# Patient Record
Sex: Male | Born: 1950 | Race: White | Hispanic: No | Marital: Married | Smoking: Never smoker
Health system: Southern US, Community
[De-identification: ages and names within clinical notes are randomized; demographics above are authoritative.]

## PROBLEM LIST (undated history)

## (undated) DIAGNOSIS — C61 Malignant neoplasm of prostate: Secondary | ICD-10-CM

## (undated) DIAGNOSIS — K219 Gastro-esophageal reflux disease without esophagitis: Secondary | ICD-10-CM

## (undated) DIAGNOSIS — M1990 Unspecified osteoarthritis, unspecified site: Secondary | ICD-10-CM

## (undated) DIAGNOSIS — T8859XA Other complications of anesthesia, initial encounter: Secondary | ICD-10-CM

## (undated) DIAGNOSIS — R32 Unspecified urinary incontinence: Secondary | ICD-10-CM

## (undated) DIAGNOSIS — Z973 Presence of spectacles and contact lenses: Secondary | ICD-10-CM

## (undated) DIAGNOSIS — G4700 Insomnia, unspecified: Secondary | ICD-10-CM

## (undated) DIAGNOSIS — Z87442 Personal history of urinary calculi: Secondary | ICD-10-CM

## (undated) DIAGNOSIS — J189 Pneumonia, unspecified organism: Secondary | ICD-10-CM

## (undated) DIAGNOSIS — N189 Chronic kidney disease, unspecified: Secondary | ICD-10-CM

## (undated) DIAGNOSIS — L570 Actinic keratosis: Secondary | ICD-10-CM

## (undated) HISTORY — DX: Actinic keratosis: L57.0

## (undated) HISTORY — PX: OTHER SURGICAL HISTORY: SHX169

## (undated) HISTORY — DX: Malignant neoplasm of prostate: C61

## (undated) HISTORY — PX: CATARACT EXTRACTION W/ INTRAOCULAR LENS IMPLANT: SHX1309

## (undated) HISTORY — PX: TRIGGER FINGER RELEASE: SHX641

## (undated) HISTORY — PX: COLONOSCOPY: SHX174

## (undated) HISTORY — PX: CARPAL TUNNEL RELEASE: SHX101

## (undated) HISTORY — PX: SHOULDER ARTHROSCOPY W/ ROTATOR CUFF REPAIR: SHX2400

---

## 2005-08-08 HISTORY — PX: CERVICAL FUSION: SHX112

## 2005-09-06 ENCOUNTER — Ambulatory Visit: Payer: Self-pay | Admitting: Orthopedic Surgery

## 2005-09-27 ENCOUNTER — Encounter: Admission: RE | Admit: 2005-09-27 | Discharge: 2005-09-27 | Payer: Self-pay | Admitting: Neurological Surgery

## 2005-12-15 ENCOUNTER — Ambulatory Visit (HOSPITAL_COMMUNITY): Admission: RE | Admit: 2005-12-15 | Discharge: 2005-12-15 | Payer: Self-pay | Admitting: Neurological Surgery

## 2006-01-10 ENCOUNTER — Encounter: Admission: RE | Admit: 2006-01-10 | Discharge: 2006-01-10 | Payer: Self-pay | Admitting: Neurological Surgery

## 2006-02-22 ENCOUNTER — Other Ambulatory Visit: Payer: Self-pay

## 2006-02-28 ENCOUNTER — Ambulatory Visit: Payer: Self-pay | Admitting: Orthopedic Surgery

## 2006-03-13 ENCOUNTER — Encounter: Admission: RE | Admit: 2006-03-13 | Discharge: 2006-03-13 | Payer: Self-pay | Admitting: Neurological Surgery

## 2007-05-24 ENCOUNTER — Ambulatory Visit: Payer: Self-pay | Admitting: Orthopedic Surgery

## 2007-06-11 ENCOUNTER — Encounter: Admission: RE | Admit: 2007-06-11 | Discharge: 2007-06-11 | Payer: Self-pay | Admitting: Neurological Surgery

## 2008-02-27 ENCOUNTER — Ambulatory Visit: Payer: Self-pay | Admitting: Orthopedic Surgery

## 2008-05-06 ENCOUNTER — Ambulatory Visit: Payer: Self-pay | Admitting: Orthopedic Surgery

## 2008-05-13 ENCOUNTER — Ambulatory Visit: Payer: Self-pay | Admitting: Orthopedic Surgery

## 2008-08-08 HISTORY — PX: RETROPUBIC PROSTATECTOMY: SUR1055

## 2009-04-01 ENCOUNTER — Ambulatory Visit: Payer: Self-pay | Admitting: Orthopedic Surgery

## 2009-06-09 ENCOUNTER — Ambulatory Visit: Payer: Self-pay | Admitting: Orthopedic Surgery

## 2009-06-15 ENCOUNTER — Ambulatory Visit: Payer: Self-pay | Admitting: Orthopedic Surgery

## 2009-07-25 ENCOUNTER — Ambulatory Visit: Payer: Self-pay | Admitting: Orthopedic Surgery

## 2009-12-31 ENCOUNTER — Ambulatory Visit: Payer: Self-pay | Admitting: Urology

## 2010-01-04 ENCOUNTER — Inpatient Hospital Stay: Payer: Self-pay | Admitting: Urology

## 2010-12-24 NOTE — Op Note (Signed)
NAME:  Elijah Taylor, Elijah Taylor NO.:  [id number]   MEDICAL RECORD NO.:  [id number]          PATIENT TYPE:  AMB   LOCATION:  SDS                          FACILITY:  [facility]   PHYSICIAN:  Tia Alert, MD     DATE OF BIRTH:  June 01, 1951   DATE OF PROCEDURE:  12/15/2005  DATE OF DISCHARGE:                                 OPERATIVE REPORT   PREOPERATIVE DIAGNOSIS:  Cervical spondylosis C3-4 with cervical disk  herniation and neck and left shoulder pain.   POSTOPERATIVE DIAGNOSIS:  Cervical spondylosis C3-4 with cervical disk  herniation and neck and left shoulder pain.   PROCEDURE:  1.  Decompressive anterior cervical diskectomy C3-4.  2.  Anterior cervical arthrodesis C3-4 utilizing a 6 mm Peek interbody cage      packed with local autograft and DBX putty.  3.  Anterior cervical plating C3-4 utilizing a 25 mm anterior plate.   SURGEON:  Tia Alert, MD   ASSISTANT:  Reinaldo Meeker, M.D.   ANESTHESIA:  General tracheal.   COMPLICATIONS:  None apparent.   INDICATIONS FOR PROCEDURE:  [name] is a very pleasant 60 year old  white male who is referred with neck pain.  He had some radiation into the  left shoulder and left pectoralis region into the interscapular region but  no radicular pain down the arm.  He had MRI which showed spondylosis at C3-4  with small disk bulges at C5-6 and C6-7.  CT myelography confirmed the disk  herniation at C3-4 to the left with very small disk bulge at C5-6 and facet  arthrosis at C7-T1 on the left side.  It was very difficult to know the  cause of his neck pain but we felt it was most likely coming from his C3-4  region.  He had a lot of tenderness and pain around the C3-4 region on the  left but also had pain in the C7-T1 region on the left side so we could not  promise he was not also symptomatic and C7-T1 region.  We talked extensively  about treatment options.  He wished for surgical management.  We recommended  an  anterior cervical diskectomy, fusion and plating at C3-4.  He understood  the risks, benefits and expected outcome of the procedure and wished to  proceed.   DESCRIPTION OF THE PROCEDURE:  The patient was taken to operating room and  after induction of adequate general endotracheal anesthesia, he was placed  in a supine position on the operating room table.  His right anterior  cervical region was prepped with DuraPrep and then draped in the usual  sterile fashion.  Five mL of local anesthesia was injected and a small  transverse incision was made to the right of midline and carried down to the  platysma muscle which was elevated, opened and undermined with Metzenbaum  scissors.  I then dissected in a plane medial to the sternocleidomastoid  muscle, internal carotid artery and lateral to the trachea and esophagus to  expose C3-4.  Intraoperative fluoroscopy confirmed my level and the longus  colli muscles were taken down and  the Shadow-Line retractors were placed  under these.  The anterior annulus was incised with a 15 blade scalpel and  the initial diskectomy was done with pituitary rongeurs and curved curettes.  We then used the high-speed drill to drill the endplates down to the level  of the posterior longitudinal ligament.  The drill shavings were saved in a  mucous trap for later arthrodesis.  We then brought in the operating  microscope and opened the posterior longitudinal ligament with a nerve hook  and removed it in a circumferential fashion along the posterior osteophytes  at C3-4.  We were careful to undercut the body of C3 and C4 to decompress  the central canal.  We angle the microscope to look up under the body of  each at C3 and at C4 to make sure we had undercut the posterior osteophytes  at these levels.  Bilateral foraminotomies were performed and the C4 nerve  root was identified bilaterally, but especially on the left side because  that is where the patient's symptoms  were.  We followed the nerve root into  the foramen.  Once the decompression was complete, the dura was quite full  and capacious all the way across.  We felt we had a good decompression of  the central canal and the nerve roots.  We irrigated with saline solution  containing bacitracin.  We measured the interspace to be 6 mm and used a 6  mm Peek interbody cage packed with local autograft saved in the drilling and  DBX putty.  We tapped this into position and then checked this under  fluoroscopy.  We then used a 25 mm Venture plate placed two 13 mm variable  angle screws in the bodies of C3 and C4 and locked these into position with  locking mechanism on the plate.  We then achieved meticulous hemostasis with  bipolar cautery, irrigated the wound once again and then inspected to make  sure we had achieved meticulous hemostasis.  We then closed the platysma  with 3-0 Vicryl, closed the subcuticular tissue with 3-0 Vicryl, and closed  the skin with Benzoin and Steri-Strips.  The drapes were removed.  Sterile  dressing was applied.  The patient was awakened from general anesthesia and  transferred to the recovery room in stable condition.  At the end of the  procedure, all sponge, needle and sponge counts were correct.      Tia Alert, MD  Electronically Signed     DSJ/MEDQ  D:  12/15/2005  T:  12/16/2005  Job:  [id number]

## 2013-08-05 ENCOUNTER — Ambulatory Visit: Payer: Self-pay | Admitting: Urology

## 2013-08-06 LAB — PATHOLOGY REPORT

## 2014-05-15 DIAGNOSIS — M65359 Trigger finger, unspecified little finger: Secondary | ICD-10-CM | POA: Diagnosis not present

## 2014-05-15 DIAGNOSIS — Z0000 Encounter for general adult medical examination without abnormal findings: Secondary | ICD-10-CM | POA: Diagnosis not present

## 2014-08-12 DIAGNOSIS — N393 Stress incontinence (female) (male): Secondary | ICD-10-CM | POA: Diagnosis not present

## 2014-08-12 DIAGNOSIS — D400 Neoplasm of uncertain behavior of prostate: Secondary | ICD-10-CM | POA: Diagnosis not present

## 2014-08-13 DIAGNOSIS — Z125 Encounter for screening for malignant neoplasm of prostate: Secondary | ICD-10-CM | POA: Diagnosis not present

## 2014-09-01 ENCOUNTER — Other Ambulatory Visit: Payer: Self-pay | Admitting: Orthopedic Surgery

## 2014-09-01 ENCOUNTER — Encounter (HOSPITAL_BASED_OUTPATIENT_CLINIC_OR_DEPARTMENT_OTHER): Payer: Self-pay | Admitting: *Deleted

## 2014-09-01 DIAGNOSIS — M65322 Trigger finger, left index finger: Secondary | ICD-10-CM | POA: Diagnosis not present

## 2014-09-01 NOTE — Progress Notes (Signed)
No labs needed

## 2014-09-02 ENCOUNTER — Ambulatory Visit (HOSPITAL_BASED_OUTPATIENT_CLINIC_OR_DEPARTMENT_OTHER): Payer: Medicare Other | Admitting: Anesthesiology

## 2014-09-02 ENCOUNTER — Ambulatory Visit (HOSPITAL_BASED_OUTPATIENT_CLINIC_OR_DEPARTMENT_OTHER)
Admission: RE | Admit: 2014-09-02 | Discharge: 2014-09-02 | Disposition: A | Discharge status: Home / Self Care | Payer: Medicare Other | Source: Ambulatory Visit | Attending: Orthopedic Surgery | Admitting: Orthopedic Surgery

## 2014-09-02 ENCOUNTER — Encounter (HOSPITAL_BASED_OUTPATIENT_CLINIC_OR_DEPARTMENT_OTHER): Payer: Self-pay | Admitting: *Deleted

## 2014-09-02 ENCOUNTER — Encounter (HOSPITAL_BASED_OUTPATIENT_CLINIC_OR_DEPARTMENT_OTHER)
Admission: RE | Disposition: A | Discharge status: Home / Self Care | Payer: Self-pay | Source: Ambulatory Visit | Attending: Orthopedic Surgery

## 2014-09-02 DIAGNOSIS — Y939 Activity, unspecified: Secondary | ICD-10-CM | POA: Diagnosis not present

## 2014-09-02 DIAGNOSIS — M1990 Unspecified osteoarthritis, unspecified site: Secondary | ICD-10-CM | POA: Insufficient documentation

## 2014-09-02 DIAGNOSIS — M65842 Other synovitis and tenosynovitis, left hand: Secondary | ICD-10-CM | POA: Diagnosis present

## 2014-09-02 DIAGNOSIS — Z8546 Personal history of malignant neoplasm of prostate: Secondary | ICD-10-CM | POA: Insufficient documentation

## 2014-09-02 DIAGNOSIS — X58XXXA Exposure to other specified factors, initial encounter: Secondary | ICD-10-CM | POA: Diagnosis not present

## 2014-09-02 DIAGNOSIS — Y929 Unspecified place or not applicable: Secondary | ICD-10-CM | POA: Insufficient documentation

## 2014-09-02 DIAGNOSIS — M65322 Trigger finger, left index finger: Secondary | ICD-10-CM | POA: Diagnosis not present

## 2014-09-02 DIAGNOSIS — Z7982 Long term (current) use of aspirin: Secondary | ICD-10-CM | POA: Insufficient documentation

## 2014-09-02 DIAGNOSIS — Y999 Unspecified external cause status: Secondary | ICD-10-CM | POA: Insufficient documentation

## 2014-09-02 DIAGNOSIS — S63611A Unspecified sprain of left index finger, initial encounter: Secondary | ICD-10-CM | POA: Insufficient documentation

## 2014-09-02 DIAGNOSIS — Z79899 Other long term (current) drug therapy: Secondary | ICD-10-CM | POA: Insufficient documentation

## 2014-09-02 HISTORY — DX: Insomnia, unspecified: G47.00

## 2014-09-02 HISTORY — PX: TRIGGER FINGER RELEASE: SHX641

## 2014-09-02 HISTORY — DX: Unspecified osteoarthritis, unspecified site: M19.90

## 2014-09-02 HISTORY — DX: Presence of spectacles and contact lenses: Z97.3

## 2014-09-02 LAB — POCT HEMOGLOBIN-HEMACUE: Hemoglobin: 16.8 g/dL (ref 13.0–17.0)

## 2014-09-02 SURGERY — RELEASE, A1 PULLEY, FOR TRIGGER FINGER
Anesthesia: Monitor Anesthesia Care | Site: Finger | Laterality: Left

## 2014-09-02 MED ORDER — LIDOCAINE HCL (PF) 0.5 % IJ SOLN
INTRAMUSCULAR | Status: DC | PRN
  Administered 2014-09-02: 30 mL via INTRAVENOUS

## 2014-09-02 MED ORDER — CEFAZOLIN SODIUM-DEXTROSE 2-3 GM-% IV SOLR: 2.0000 g | INTRAVENOUS | Status: DC

## 2014-09-02 MED ORDER — LACTATED RINGERS IV SOLN
INTRAVENOUS | Status: DC
  Administered 2014-09-02: 09:00:00 via INTRAVENOUS

## 2014-09-02 MED ORDER — BUPIVACAINE HCL (PF) 0.25 % IJ SOLN
INTRAMUSCULAR | Status: DC | PRN
  Administered 2014-09-02: 3 mL

## 2014-09-02 MED ORDER — CHLORHEXIDINE GLUCONATE 4 % EX LIQD: 60.0000 mL | Freq: Once | CUTANEOUS | Status: DC

## 2014-09-02 MED ORDER — PROPOFOL INFUSION 10 MG/ML OPTIME
INTRAVENOUS | Status: DC | PRN
  Administered 2014-09-02: 100 ug/kg/min via INTRAVENOUS

## 2014-09-02 MED ORDER — CEFAZOLIN SODIUM-DEXTROSE 2-3 GM-% IV SOLR
2.0000 g | INTRAVENOUS | Status: AC
  Administered 2014-09-02: 2 g via INTRAVENOUS

## 2014-09-02 MED ORDER — HYDROMORPHONE HCL 1 MG/ML IJ SOLN: 0.2500 mg | INTRAMUSCULAR | Status: DC | PRN

## 2014-09-02 MED ORDER — MIDAZOLAM HCL 2 MG/2ML IJ SOLN
INTRAMUSCULAR | Status: AC
  Filled 2014-09-02: qty 2

## 2014-09-02 MED ORDER — FENTANYL CITRATE 0.05 MG/ML IJ SOLN
INTRAMUSCULAR | Status: AC
  Filled 2014-09-02: qty 2

## 2014-09-02 MED ORDER — OXYCODONE HCL 5 MG/5ML PO SOLN: 5.0000 mg | Freq: Once | ORAL | Status: DC | PRN

## 2014-09-02 MED ORDER — FENTANYL CITRATE 0.05 MG/ML IJ SOLN: 50.0000 ug | INTRAMUSCULAR | Status: DC | PRN

## 2014-09-02 MED ORDER — FENTANYL CITRATE 0.05 MG/ML IJ SOLN
INTRAMUSCULAR | Status: DC | PRN
  Administered 2014-09-02: 100 ug via INTRAVENOUS

## 2014-09-02 MED ORDER — OXYCODONE HCL 5 MG PO TABS: 5.0000 mg | ORAL_TABLET | Freq: Once | ORAL | Status: DC | PRN

## 2014-09-02 MED ORDER — MIDAZOLAM HCL 2 MG/2ML IJ SOLN: 1.0000 mg | INTRAMUSCULAR | Status: DC | PRN

## 2014-09-02 MED ORDER — PROPOFOL 10 MG/ML IV BOLUS
INTRAVENOUS | Status: DC | PRN
  Administered 2014-09-02: 50 mg via INTRAVENOUS

## 2014-09-02 MED ORDER — ONDANSETRON HCL 4 MG/2ML IJ SOLN
INTRAMUSCULAR | Status: DC | PRN
  Administered 2014-09-02: 4 mg via INTRAVENOUS

## 2014-09-02 MED ORDER — HYDROCODONE-ACETAMINOPHEN 5-325 MG PO TABS: 1.0000 | ORAL_TABLET | Freq: Four times a day (QID) | ORAL | Status: DC | PRN

## 2014-09-02 MED ORDER — PROMETHAZINE HCL 25 MG/ML IJ SOLN: 6.2500 mg | INTRAMUSCULAR | Status: DC | PRN

## 2014-09-02 MED ORDER — CEFAZOLIN SODIUM 1-5 GM-% IV SOLN
INTRAVENOUS | Status: AC
  Filled 2014-09-02: qty 100

## 2014-09-02 MED ORDER — MIDAZOLAM HCL 5 MG/5ML IJ SOLN
INTRAMUSCULAR | Status: DC | PRN
  Administered 2014-09-02: 2 mg via INTRAVENOUS

## 2014-09-02 SURGICAL SUPPLY — 36 items
BANDAGE COBAN STERILE 2 (GAUZE/BANDAGES/DRESSINGS) ×3 IMPLANT
BLADE SURG 15 STRL LF DISP TIS (BLADE) ×1 IMPLANT
BLADE SURG 15 STRL SS (BLADE) ×3
BNDG CMPR 9X4 STRL LF SNTH (GAUZE/BANDAGES/DRESSINGS) ×1
BNDG ESMARK 4X9 LF (GAUZE/BANDAGES/DRESSINGS) ×2 IMPLANT
CHLORAPREP W/TINT 26ML (MISCELLANEOUS) ×3 IMPLANT
CORDS BIPOLAR (ELECTRODE) IMPLANT
COVER BACK TABLE 60X90IN (DRAPES) ×3 IMPLANT
COVER MAYO STAND STRL (DRAPES) ×3 IMPLANT
CUFF TOURNIQUET SINGLE 18IN (TOURNIQUET CUFF) ×2 IMPLANT
DECANTER SPIKE VIAL GLASS SM (MISCELLANEOUS) IMPLANT
DRAPE EXTREMITY T 121X128X90 (DRAPE) ×3 IMPLANT
DRAPE SURG 17X23 STRL (DRAPES) ×3 IMPLANT
GAUZE SPONGE 4X4 12PLY STRL (GAUZE/BANDAGES/DRESSINGS) ×3 IMPLANT
GAUZE XEROFORM 1X8 LF (GAUZE/BANDAGES/DRESSINGS) ×3 IMPLANT
GLOVE BIO SURGEON STRL SZ 6.5 (GLOVE) ×1 IMPLANT
GLOVE BIO SURGEONS STRL SZ 6.5 (GLOVE) ×1
GLOVE BIOGEL PI IND STRL 7.0 (GLOVE) IMPLANT
GLOVE BIOGEL PI IND STRL 8.5 (GLOVE) ×1 IMPLANT
GLOVE BIOGEL PI INDICATOR 7.0 (GLOVE) ×2
GLOVE BIOGEL PI INDICATOR 8.5 (GLOVE) ×2
GLOVE EXAM NITRILE MD LF STRL (GLOVE) ×2 IMPLANT
GLOVE SURG ORTHO 8.0 STRL STRW (GLOVE) ×3 IMPLANT
GOWN STRL REUS W/ TWL LRG LVL3 (GOWN DISPOSABLE) ×1 IMPLANT
GOWN STRL REUS W/TWL LRG LVL3 (GOWN DISPOSABLE) ×3
GOWN STRL REUS W/TWL XL LVL3 (GOWN DISPOSABLE) ×3 IMPLANT
NDL PRECISIONGLIDE 27X1.5 (NEEDLE) ×1 IMPLANT
NEEDLE PRECISIONGLIDE 27X1.5 (NEEDLE) ×3 IMPLANT
NS IRRIG 1000ML POUR BTL (IV SOLUTION) ×3 IMPLANT
PACK BASIN DAY SURGERY FS (CUSTOM PROCEDURE TRAY) ×3 IMPLANT
STOCKINETTE 4X48 STRL (DRAPES) ×3 IMPLANT
SYR BULB 3OZ (MISCELLANEOUS) ×3 IMPLANT
SYR CONTROL 10ML LL (SYRINGE) ×3 IMPLANT
TOWEL OR 17X24 6PK STRL BLUE (TOWEL DISPOSABLE) ×6 IMPLANT
TOWEL OR NON WOVEN STRL DISP B (DISPOSABLE) ×2 IMPLANT
UNDERPAD 30X30 INCONTINENT (UNDERPADS AND DIAPERS) ×1 IMPLANT

## 2014-09-02 NOTE — H&P (Signed)
Elijah Taylor is a 64 year old right hand dominant male who comes in complaining of catching of his left index finger. This has been going on for 4 months. Dr. Derrel Nip has released 3 other fingers, right ring, left long and left ring in the past. He states this is identically the same. He recalls no history of injury. There is no history of diabetes, thyroid problems, arthritis or gout. He complains of constant, moderate, throbbing pain with tenderness directly over the MCP joint volar aspect of the left index finger. Activity makes it worse. He states moving it has helped. He is complaining of it becoming stiff.  PAST MEDICAL HISTORY:  He has no known drug allergies. He is on aspirin and occasional sleeping pill. He has had prostate cancer, rotator cuff surgery, neck disc, trigger fingers, reconstruction left thumb, carpal tunnel releases, right middle finger release.  FAMILY MEDICAL HISTORY: Negative.  SOCIAL HISTORY:  He does not smoke or use alcohol. He is married and retired.  REVIEW OF SYSTEMS: Positive for glasses, ringing in his ears, sleep disorder, otherwise negative 14 points.  Elijah Taylor is an 64 y.o. male.   Chief Complaint: STS left Index Finger HPI: see above  Past Medical History  Diagnosis Date  . Wears glasses   . Arthritis   . Cancer     prostate  . Insomnia     Past Surgical History  Procedure Laterality Date  . Trigger finger release      x3  . Shoulder arthroscopy w/ rotator cuff repair      right and left  . Cervical fusion  2007  . Carpal tunnel release      left  . Colonoscopy    . Retropubic prostatectomy  2010    History reviewed. No pertinent family history. Social History:  reports that he has never smoked. He does not have any smokeless tobacco history on file. He reports that he does not drink alcohol or use illicit drugs.  Allergies: No Known Allergies  Medications Prior to Admission  Medication Sig Dispense Refill  . ADEK pediatric  multivitamin (AQUADEKS) LIQD Take by mouth.    Marland Kitchen aspirin 81 MG tablet Take 81 mg by mouth daily.    . traZODone (DESYREL) 50 MG tablet Take 50 mg by mouth at bedtime.      Results for orders placed or performed during the hospital encounter of 09/02/14 (from the past 48 hour(s))  Hemoglobin-hemacue, POC     Status: None   Collection Time: 09/02/14  9:26 AM  Result Value Ref Range   Hemoglobin 16.8 13.0 - 17.0 g/dL    No results found.   Pertinent items are noted in HPI.  Blood pressure 146/93, pulse 95, temperature 98.3 F (36.8 C), temperature source Oral, resp. rate 16, height 5\' 10"  (1.778 m), weight 93.441 kg (206 lb), SpO2 96 %.  General appearance: alert, cooperative and appears stated age Head: Normocephalic, without obvious abnormality Neck: no JVD Resp: clear to auscultation bilaterally Cardio: regular rate and rhythm, S1, S2 normal, no murmur, click, rub or gallop GI: soft, non-tender; bowel sounds normal; no masses,  no organomegaly Extremities: trigger left index finger Pulses: 2+ and symmetric Skin: Skin color, texture, turgor normal. No rashes or lesions Neurologic: Grossly normal Incision/Wound: na  Assessment/Plan X-rays are negative.   DIAGNOSIS: STS left index finger.   We have discussed the etiology with him. He would like to have this released. He does not want any injections. Pre, peri and  post op care are discussed along with risks and complications. Patient is aware there is no guarantee with surgery, possibility of infection, injury to arteries, nerves, and tendons, incomplete relief and dystrophy. He is scheduled for release A-1 pulley left index finger as an outpatient under regional anesthesia.  Milan Perkins R 09/02/2014, 9:40 AM

## 2014-09-02 NOTE — Op Note (Signed)
Elijah Taylor, Elijah Taylor NO.:  [id number]  MEDICAL RECORD NO.:  [id number]  LOCATION:                                 FACILITY:  PHYSICIAN:  Daryll Brod, M.D.            DATE OF BIRTH:  DATE OF PROCEDURE:  09/02/2014 DATE OF DISCHARGE:                              OPERATIVE REPORT   PREOPERATIVE DIAGNOSIS:  Stenosing tenosynovitis, left index finger.  POSTOPERATIVE DIAGNOSIS:  Stenosing tenosynovitis, left index finger.  OPERATION:  Release A1 pulley, left index finger.  SURGEON:  Daryll Brod, MD  ANESTHESIA:  Forearm-based IV regional with local infiltration.  ANESTHESIOLOGIST:  Soledad Gerlach, MD  HISTORY:  The patient is a 64 year old male with a history of triggering of his left index finger.  He has had multiple trigger fingers released in the past by Dr. Derrel Nip.  He is desirous of having this released without injections.  Pre, peri, and postoperative course have been discussed along with risks and complications.  He is aware that there is no guarantee with the surgery, possibility of infection, recurrence of injury to arteries, nerves, tendons, incomplete relief of symptoms, and dystrophy.  In the preoperative area, the patient is seen, the extremity marked by both patient and surgeon.  Antibiotic given.  PROCEDURE IN DETAIL:  The patient was brought to the operating room, where a forearm-based IV regional anesthetic was carried out without difficulty.  He was prepped using ChloraPrep, supine position with the left arm free.  A 3-minute dry time was allowed.  Time-out taken, confirming the patient and procedure.  An oblique incision was made over the A1 pulley of the left index finger carried down through subcutaneous tissue.  Bleeders were electrocauterized with bipolar.  The neurovascular bundles were retracted radially and ulnarly.  The A1 pulley was identified.  This was found to be markedly thickened.  An incision was made on its radial  aspect.  A small incision made centrally in the A2 pulley.  A partial tenosynovectomy performed proximally with separation of 2 flexor tendons.  Finger placed through full range of motion, no further triggering was noted.  The wound was irrigated with saline and closed with interrupted 5-0 nylon sutures.  Local infiltration with 0.25% bupivacaine without epinephrine was given, approximately 4 mL was used.  Sterile compressive dressing with the fingers free was applied.  On deflation of the tourniquet, all fingers immediately pinked.  He was taken to the recovery room for observation in satisfactory condition. He will be discharged home to return to the [facility] in 1 week on Vicodin.          ______________________________ Daryll Brod, M.D.     GK/MEDQ  D:  09/02/2014  T:  09/02/2014  Job:  [id number]

## 2014-09-02 NOTE — Op Note (Signed)
Dictation Number [id number]

## 2014-09-02 NOTE — Anesthesia Postprocedure Evaluation (Signed)
  Anesthesia Post-op Note  Patient: Elijah Taylor  Procedure(s) Performed: Procedure(s) with comments: RELEASE A-1 PULLEY LEFT INDEX FINGER (Left) - ANESTHESIA: IV REGIONAL FAB  Patient Location: PACU  Anesthesia Type:MAC and Bier block  Level of Consciousness: awake and alert   Airway and Oxygen Therapy: Patient Spontanous Breathing  Post-op Pain: none  Post-op Assessment: Post-op Vital signs reviewed  Post-op Vital Signs: Reviewed  Last Vitals:  Filed Vitals:   09/02/14 1100  BP: 134/84  Pulse: 90  Temp: 36.7 C  Resp: 18    Complications: No apparent anesthesia complications

## 2014-09-02 NOTE — Anesthesia Preprocedure Evaluation (Addendum)
Anesthesia Evaluation  Patient identified by MRN, date of birth, ID band Patient awake    Reviewed: Allergy & Precautions, NPO status , Patient's Chart, lab work & pertinent test results  Airway Mallampati: I  TM Distance: >3 FB Neck ROM: Full    Dental   Pulmonary neg pulmonary ROS,          Cardiovascular negative cardio ROS      Neuro/Psych negative neurological ROS     GI/Hepatic negative GI ROS, Neg liver ROS,   Endo/Other  negative endocrine ROS  Renal/GU negative Renal ROS     Musculoskeletal  (+) Arthritis -,   Abdominal   Peds  Hematology negative hematology ROS (+)   Anesthesia Other Findings   Reproductive/Obstetrics                            Anesthesia Physical Anesthesia Plan  ASA: I  Anesthesia Plan: Bier Block and MAC   Post-op Pain Management:    Induction: Intravenous  Airway Management Planned: Simple Face Mask and Natural Airway  Additional Equipment:   Intra-op Plan:   Post-operative Plan:   Informed Consent: I have reviewed the patients History and Physical, chart, labs and discussed the procedure including the risks, benefits and alternatives for the proposed anesthesia with the patient or authorized representative who has indicated his/her understanding and acceptance.     Plan Discussed with: CRNA  Anesthesia Plan Comments:         Anesthesia Quick Evaluation

## 2014-09-02 NOTE — Discharge Instructions (Signed)

## 2014-09-02 NOTE — Brief Op Note (Signed)
09/02/2014  10:26 AM  PATIENT:  Elijah Taylor  64 y.o. male  PRE-OPERATIVE DIAGNOSIS:  STENOSING TENOSYNOVITIS LEFT INDEX FINGER  POST-OPERATIVE DIAGNOSIS:  STENOSING TENOSYNOVITIS LEFT INDEX FINGER  PROCEDURE:  Procedure(s) with comments: RELEASE A-1 PULLEY LEFT INDEX FINGER (Left) - ANESTHESIA: IV REGIONAL FAB  SURGEON:  Surgeon(s) and Role:    * Daryll Brod, MD - Primary  PHYSICIAN ASSISTANT:   ASSISTANTS: none   ANESTHESIA:   local and regional  EBL:  Total I/O In: 300 [I.V.:300] Out: -   BLOOD ADMINISTERED:none  DRAINS: none   LOCAL MEDICATIONS USED:  BUPIVICAINE   SPECIMEN:  No Specimen  DISPOSITION OF SPECIMEN:  N/A  COUNTS:  YES  TOURNIQUET:   Total Tourniquet Time Documented: Forearm (Left) - 18 minutes Total: Forearm (Left) - 18 minutes   DICTATION: .Other Dictation: Dictation Number [id number]  PLAN OF CARE: Discharge to home after PACU  PATIENT DISPOSITION:  PACU - hemodynamically stable.

## 2014-09-02 NOTE — Transfer of Care (Signed)
Immediate Anesthesia Transfer of Care Note  Patient: Elijah Taylor  Procedure(s) Performed: Procedure(s) with comments: RELEASE A-1 PULLEY LEFT INDEX FINGER (Left) - ANESTHESIA: IV REGIONAL FAB  Patient Location: PACU  Anesthesia Type:MAC and Bier block  Level of Consciousness: awake, alert  and oriented  Airway & Oxygen Therapy: Patient Spontanous Breathing and Patient connected to face mask oxygen  Post-op Assessment: Report given to PACU RN and Post -op Vital signs reviewed and stable  Post vital signs: Reviewed and stable  Complications: No apparent anesthesia complications

## 2014-09-03 ENCOUNTER — Encounter (HOSPITAL_BASED_OUTPATIENT_CLINIC_OR_DEPARTMENT_OTHER): Payer: Self-pay | Admitting: Orthopedic Surgery

## 2014-11-17 ENCOUNTER — Other Ambulatory Visit: Payer: Self-pay | Admitting: Orthopedic Surgery

## 2014-11-17 DIAGNOSIS — G5601 Carpal tunnel syndrome, right upper limb: Secondary | ICD-10-CM | POA: Diagnosis not present

## 2014-11-17 DIAGNOSIS — G5602 Carpal tunnel syndrome, left upper limb: Secondary | ICD-10-CM | POA: Diagnosis not present

## 2014-11-20 ENCOUNTER — Encounter (HOSPITAL_BASED_OUTPATIENT_CLINIC_OR_DEPARTMENT_OTHER): Payer: Self-pay | Admitting: *Deleted

## 2014-11-25 ENCOUNTER — Ambulatory Visit (HOSPITAL_BASED_OUTPATIENT_CLINIC_OR_DEPARTMENT_OTHER): Admission: RE | Admit: 2014-11-25 | Payer: Medicare Other | Source: Ambulatory Visit | Admitting: Orthopedic Surgery

## 2014-11-25 ENCOUNTER — Other Ambulatory Visit: Payer: Self-pay | Admitting: Orthopedic Surgery

## 2014-11-25 SURGERY — CARPAL TUNNEL RELEASE
Anesthesia: Regional | Laterality: Right

## 2014-11-25 MED ORDER — MIDAZOLAM HCL 2 MG/2ML IJ SOLN
INTRAMUSCULAR | Status: AC
  Filled 2014-11-25: qty 2

## 2014-11-25 MED ORDER — FENTANYL CITRATE (PF) 100 MCG/2ML IJ SOLN
INTRAMUSCULAR | Status: AC
  Filled 2014-11-25: qty 2

## 2014-11-27 ENCOUNTER — Encounter (HOSPITAL_BASED_OUTPATIENT_CLINIC_OR_DEPARTMENT_OTHER): Payer: Self-pay | Admitting: *Deleted

## 2014-11-28 NOTE — H&P (Signed)
PATIENT NAME:  Elijah Taylor, Elijah Taylor MR#:  [id number] DATE OF BIRTH:  03/27/1951  DATE OF ADMISSION:  08/05/2013  The patient is to have same-day surgery on 08/05/2013.   CHIEF COMPLAINT: 1. Difficulty retracting foreskin.  2. Urinary incontinence.   HISTORY OF PRESENT ILLNESS: Mr. Alvillar is a 64 year old white male with a 51-month history of difficulty retracting his foreskin. He was found to have phimosis in the office.   The patient has also noted stress incontinence in the past year. Specifically, he has incontinence when he lifts, exercises, coughs and sneezes.   PAST MEDICAL HISTORY:  ALLERGIES: No drug allergies.   CURRENT MEDICATIONS: Include Halcion and aspirin.   PREVIOUS SURGICAL PROCEDURES: Included:  1. Carpal tunnel repair in 2004. 2. C-spine repair in 2006.  3. Left rotator cuff repair in 2009.  4. Right rotator cuff repair in 2010. 5. Radical prostatectomy in 2011.   SOCIAL HISTORY: The patient denied tobacco or alcohol use.   FAMILY HISTORY: Remarkable for father with prostate cancer and siblings with heart disease and chronic renal disease.   REVIEW OF SYSTEMS: The patient denied heart disease, chest pain, shortness of breath, diabetes, stroke or lung disease. He does have chronic low back pain.   PHYSICAL EXAMINATION:  GENERAL: Well-nourished white male in no acute distress.  HEENT: Sclerae were clear. Pupils were equally round and reactive to light and accommodation. Extraocular movements were intact.  NECK: Supple. No palpable cervical adenopathy.  LUNGS: Clear to auscultation.  CARDIOVASCULAR: Regular rhythm and rate without audible murmurs.  ABDOMEN: Soft, nontender abdomen.  GENITOURINARY: Uncircumcised with phimosis. I could not fully retract the foreskin. Testes were smooth, nontender, 18 mL in size each.  RECTAL: No palpable rectal masses. Prostate and seminal vesicles absent.  NEUROMUSCULAR: Alert and oriented x3.   IMPRESSION:  1. Stress  urinary incontinence.  2. Phimosis with recurrent balanitis.   PLAN:  1. Circumcision. 2. Cystoscopy with Macroplastique implant.    ____________________________ Otelia Limes. Yves Dill, MD mrw:lb D: 07/30/2013 13:05:00 ET T: 07/30/2013 13:27:24 ET JOB#: [id number]  cc: Otelia Limes. Yves Dill, MD, <Dictator> Royston Cowper MD ELECTRONICALLY SIGNED 07/31/2013 8:30

## 2014-11-28 NOTE — Op Note (Signed)
PATIENT NAME:  Elijah Taylor, Elijah Taylor MR#:  [id number] DATE OF BIRTH:  [birth year]  DATE OF PROCEDURE:  08/05/2013  PREOPERATIVE DIAGNOSES: 1.  Urinary incontinence.  2.  Phimosis.  3.  Recurrent balanoposthitis.  POSTOPERATIVE DIAGNOSES:  1.  Urinary incontinence.  2.  Phimosis.  3.  Recurrent balanoposthitis.  PROCEDURES: 1.  Cystoscopy with Macroplastique injection.  2.  Circumcision.  3.  Penile block.   SURGEON: Maryan Puls, M.D.   ANESTHETIST: Rice and Yves Dill.  ANESTHETIC METHOD: General per Rice and penile block per Dr. Yves Dill.   INDICATIONS: See the dictated history and physical. After informed consent, the patient requests the above procedures.   OPERATIVE SUMMARY: After adequate general anesthesia had been obtained, the patient was placed into dorsal lithotomy position and the perineum was prepped and draped in the usual fashion. The 21-French cystoscope was coupled with the camera and then visually advanced into the bladder. Prostate gland was absent. No bladder neck contracture or strictures were noted. The bladder was moderately trabeculated. Both ureteral orifices were identified and had clear efflux. At this point, the cystoscope was positioned in the mid urethra with clear visualization of the bladder neck. The Macroplastique needle injector system was introduced through the scope and initial puncture performed at the 3 o'clock position. 3 mL was injected. The needle was then moved down to the 6 o'clock position and a total of 6 mL injected in this location. Next, the needle was placed at the 9 o'clock position and 3 mL was injected here. Finally, the needle was placed at the 12 o'clock position and 3 mL injected here. At the conclusion of all injections, there was good coaptation of the bladder neck mucosa. The scope was removed and the bladder was drained with a 10-French red Robinson catheter. At this point, standard double ring circumcision was performed. Bleeders were  controlled with electrocautery. The skin edges were reapproximated with interrupted 3-0 chromic suture. Penile block was performed with a solution of 1% Xylocaine and 0.5% Marcaine. Vaseline gauze and sterile dressing were applied. Sponge, needle, and instrument counts were noted to be correct. The procedure was then terminated and the patient was transferred to the recovery room in stable condition. ____________________________ Elijah Taylor. Yves Dill, MD mrw:sb D: 08/05/2013 11:08:42 ET T: 08/05/2013 12:09:44 ET JOB#: [id number]  cc: Elijah Taylor. Yves Dill, MD, <Dictator> Royston Cowper MD ELECTRONICALLY SIGNED 08/05/2013 17:59

## 2014-12-01 ENCOUNTER — Encounter (HOSPITAL_BASED_OUTPATIENT_CLINIC_OR_DEPARTMENT_OTHER)
Admission: RE | Disposition: A | Discharge status: Home / Self Care | Payer: Self-pay | Source: Ambulatory Visit | Attending: Orthopedic Surgery

## 2014-12-01 ENCOUNTER — Ambulatory Visit (HOSPITAL_BASED_OUTPATIENT_CLINIC_OR_DEPARTMENT_OTHER)
Admission: RE | Admit: 2014-12-01 | Discharge: 2014-12-01 | Disposition: A | Discharge status: Home / Self Care | Payer: Medicare Other | Source: Ambulatory Visit | Attending: Orthopedic Surgery | Admitting: Orthopedic Surgery

## 2014-12-01 ENCOUNTER — Encounter (HOSPITAL_BASED_OUTPATIENT_CLINIC_OR_DEPARTMENT_OTHER): Payer: Self-pay | Admitting: Certified Registered"

## 2014-12-01 ENCOUNTER — Ambulatory Visit (HOSPITAL_BASED_OUTPATIENT_CLINIC_OR_DEPARTMENT_OTHER): Payer: Medicare Other | Admitting: Certified Registered"

## 2014-12-01 DIAGNOSIS — Z79899 Other long term (current) drug therapy: Secondary | ICD-10-CM | POA: Insufficient documentation

## 2014-12-01 DIAGNOSIS — G5601 Carpal tunnel syndrome, right upper limb: Secondary | ICD-10-CM | POA: Diagnosis not present

## 2014-12-01 DIAGNOSIS — Z7982 Long term (current) use of aspirin: Secondary | ICD-10-CM | POA: Diagnosis not present

## 2014-12-01 DIAGNOSIS — Z8546 Personal history of malignant neoplasm of prostate: Secondary | ICD-10-CM | POA: Insufficient documentation

## 2014-12-01 DIAGNOSIS — Z981 Arthrodesis status: Secondary | ICD-10-CM | POA: Insufficient documentation

## 2014-12-01 DIAGNOSIS — M1990 Unspecified osteoarthritis, unspecified site: Secondary | ICD-10-CM | POA: Insufficient documentation

## 2014-12-01 DIAGNOSIS — G4700 Insomnia, unspecified: Secondary | ICD-10-CM | POA: Insufficient documentation

## 2014-12-01 DIAGNOSIS — Z9889 Other specified postprocedural states: Secondary | ICD-10-CM | POA: Diagnosis not present

## 2014-12-01 HISTORY — PX: CARPAL TUNNEL RELEASE: SHX101

## 2014-12-01 HISTORY — DX: Unspecified urinary incontinence: R32

## 2014-12-01 LAB — POCT HEMOGLOBIN-HEMACUE: Hemoglobin: 17.4 g/dL — ABNORMAL HIGH (ref 13.0–17.0)

## 2014-12-01 SURGERY — CARPAL TUNNEL RELEASE
Anesthesia: General | Laterality: Right

## 2014-12-01 MED ORDER — CEFAZOLIN SODIUM-DEXTROSE 2-3 GM-% IV SOLR: 2.0000 g | INTRAVENOUS | Status: DC

## 2014-12-01 MED ORDER — ONDANSETRON HCL 4 MG/2ML IJ SOLN
INTRAMUSCULAR | Status: DC | PRN
  Administered 2014-12-01: 4 mg via INTRAVENOUS

## 2014-12-01 MED ORDER — ONDANSETRON HCL 4 MG/2ML IJ SOLN: 4.0000 mg | Freq: Once | INTRAMUSCULAR | Status: DC | PRN

## 2014-12-01 MED ORDER — FENTANYL CITRATE (PF) 100 MCG/2ML IJ SOLN
50.0000 ug | INTRAMUSCULAR | Status: DC | PRN
  Administered 2014-12-01: 50 ug via INTRAVENOUS

## 2014-12-01 MED ORDER — BUPIVACAINE HCL (PF) 0.25 % IJ SOLN
INTRAMUSCULAR | Status: AC
  Filled 2014-12-01: qty 30

## 2014-12-01 MED ORDER — PROPOFOL 10 MG/ML IV BOLUS
INTRAVENOUS | Status: DC | PRN
  Administered 2014-12-01: 200 mg via INTRAVENOUS

## 2014-12-01 MED ORDER — OXYCODONE HCL 5 MG PO TABS
5.0000 mg | ORAL_TABLET | Freq: Once | ORAL | Status: AC | PRN
  Administered 2014-12-01: 5 mg via ORAL

## 2014-12-01 MED ORDER — CHLORHEXIDINE GLUCONATE 4 % EX LIQD: 60.0000 mL | Freq: Once | CUTANEOUS | Status: DC

## 2014-12-01 MED ORDER — MIDAZOLAM HCL 2 MG/2ML IJ SOLN: 1.0000 mg | INTRAMUSCULAR | Status: DC | PRN

## 2014-12-01 MED ORDER — FENTANYL CITRATE (PF) 100 MCG/2ML IJ SOLN
INTRAMUSCULAR | Status: AC
  Filled 2014-12-01: qty 2

## 2014-12-01 MED ORDER — LACTATED RINGERS IV SOLN
INTRAVENOUS | Status: DC
  Administered 2014-12-01 (×2): via INTRAVENOUS

## 2014-12-01 MED ORDER — CEFAZOLIN SODIUM-DEXTROSE 2-3 GM-% IV SOLR
2.0000 g | INTRAVENOUS | Status: DC
  Administered 2014-12-01: 2 g via INTRAVENOUS

## 2014-12-01 MED ORDER — MIDAZOLAM HCL 2 MG/2ML IJ SOLN
INTRAMUSCULAR | Status: AC
  Filled 2014-12-01: qty 2

## 2014-12-01 MED ORDER — LACTATED RINGERS IV SOLN: INTRAVENOUS | Status: DC

## 2014-12-01 MED ORDER — FENTANYL CITRATE 0.05 MG/ML IJ SOLN
50.0000 ug | INTRAMUSCULAR | Status: DC | PRN
  Administered 2014-12-01: 50 ug via INTRAVENOUS
  Administered 2014-12-01 (×2): 25 ug via INTRAVENOUS

## 2014-12-01 MED ORDER — CHLORHEXIDINE GLUCONATE 4 % EX LIQD
60.0000 mL | Freq: Once | CUTANEOUS | Status: DC
Start: 2014-12-01 — End: 2014-12-01

## 2014-12-01 MED ORDER — HYDROMORPHONE HCL 1 MG/ML IJ SOLN: 0.2500 mg | INTRAMUSCULAR | Status: DC | PRN

## 2014-12-01 MED ORDER — OXYCODONE HCL 5 MG/5ML PO SOLN: 5.0000 mg | Freq: Once | ORAL | Status: AC | PRN

## 2014-12-01 MED ORDER — 0.9 % SODIUM CHLORIDE (POUR BTL) OPTIME
TOPICAL | Status: DC | PRN
  Administered 2014-12-01: 200 mL

## 2014-12-01 MED ORDER — LIDOCAINE HCL (CARDIAC) 20 MG/ML IV SOLN
INTRAVENOUS | Status: DC | PRN
  Administered 2014-12-01: 60 mg via INTRAVENOUS

## 2014-12-01 MED ORDER — HYDROCODONE-ACETAMINOPHEN 5-325 MG PO TABS: 1.0000 | ORAL_TABLET | Freq: Four times a day (QID) | ORAL | Status: DC | PRN

## 2014-12-01 MED ORDER — BUPIVACAINE HCL (PF) 0.25 % IJ SOLN
INTRAMUSCULAR | Status: DC | PRN
  Administered 2014-12-01: 7 mL

## 2014-12-01 MED ORDER — MIDAZOLAM HCL 2 MG/2ML IJ SOLN
1.0000 mg | INTRAMUSCULAR | Status: DC | PRN
Start: 2014-12-01 — End: 2014-12-01
  Administered 2014-12-01: 1 mg via INTRAVENOUS

## 2014-12-01 MED ORDER — OXYCODONE HCL 5 MG PO TABS
ORAL_TABLET | ORAL | Status: AC
  Filled 2014-12-01: qty 1

## 2014-12-01 MED ORDER — CEFAZOLIN SODIUM-DEXTROSE 2-3 GM-% IV SOLR
INTRAVENOUS | Status: AC
  Filled 2014-12-01: qty 50

## 2014-12-01 MED ORDER — DEXAMETHASONE SODIUM PHOSPHATE 10 MG/ML IJ SOLN
INTRAMUSCULAR | Status: DC | PRN
  Administered 2014-12-01: 10 mg via INTRAVENOUS

## 2014-12-01 SURGICAL SUPPLY — 36 items
BLADE SURG 15 STRL LF DISP TIS (BLADE) ×1 IMPLANT
BLADE SURG 15 STRL SS (BLADE) ×3
BNDG CMPR 9X4 STRL LF SNTH (GAUZE/BANDAGES/DRESSINGS) ×1
BNDG COHESIVE 3X5 TAN STRL LF (GAUZE/BANDAGES/DRESSINGS) ×3 IMPLANT
BNDG ESMARK 4X9 LF (GAUZE/BANDAGES/DRESSINGS) ×2 IMPLANT
BNDG GAUZE ELAST 4 BULKY (GAUZE/BANDAGES/DRESSINGS) ×3 IMPLANT
CHLORAPREP W/TINT 26ML (MISCELLANEOUS) ×3 IMPLANT
CORDS BIPOLAR (ELECTRODE) ×3 IMPLANT
COVER BACK TABLE 60X90IN (DRAPES) ×3 IMPLANT
COVER MAYO STAND STRL (DRAPES) ×3 IMPLANT
CUFF TOURNIQUET SINGLE 18IN (TOURNIQUET CUFF) ×3 IMPLANT
DRAPE EXTREMITY T 121X128X90 (DRAPE) ×3 IMPLANT
DRAPE SURG 17X23 STRL (DRAPES) ×3 IMPLANT
DRSG PAD ABDOMINAL 8X10 ST (GAUZE/BANDAGES/DRESSINGS) ×3 IMPLANT
GAUZE SPONGE 4X4 12PLY STRL (GAUZE/BANDAGES/DRESSINGS) ×3 IMPLANT
GAUZE XEROFORM 1X8 LF (GAUZE/BANDAGES/DRESSINGS) ×3 IMPLANT
GLOVE BIOGEL PI IND STRL 7.0 (GLOVE) IMPLANT
GLOVE BIOGEL PI IND STRL 8.5 (GLOVE) ×1 IMPLANT
GLOVE BIOGEL PI INDICATOR 7.0 (GLOVE) ×4
GLOVE BIOGEL PI INDICATOR 8.5 (GLOVE) ×2
GLOVE ECLIPSE 6.5 STRL STRAW (GLOVE) ×4 IMPLANT
GLOVE SURG ORTHO 8.0 STRL STRW (GLOVE) ×3 IMPLANT
GOWN STRL REUS W/ TWL LRG LVL3 (GOWN DISPOSABLE) ×1 IMPLANT
GOWN STRL REUS W/TWL LRG LVL3 (GOWN DISPOSABLE) ×3
GOWN STRL REUS W/TWL XL LVL3 (GOWN DISPOSABLE) ×3 IMPLANT
NDL PRECISIONGLIDE 27X1.5 (NEEDLE) IMPLANT
NEEDLE PRECISIONGLIDE 27X1.5 (NEEDLE) ×3 IMPLANT
NS IRRIG 1000ML POUR BTL (IV SOLUTION) ×3 IMPLANT
PACK BASIN DAY SURGERY FS (CUSTOM PROCEDURE TRAY) ×3 IMPLANT
STOCKINETTE 4X48 STRL (DRAPES) ×3 IMPLANT
SUT ETHILON 4 0 PS 2 18 (SUTURE) ×3 IMPLANT
SUT VICRYL 4-0 PS2 18IN ABS (SUTURE) IMPLANT
SYR BULB 3OZ (MISCELLANEOUS) ×3 IMPLANT
SYR CONTROL 10ML LL (SYRINGE) ×2 IMPLANT
TOWEL OR 17X24 6PK STRL BLUE (TOWEL DISPOSABLE) ×3 IMPLANT
UNDERPAD 30X30 INCONTINENT (UNDERPADS AND DIAPERS) ×3 IMPLANT

## 2014-12-01 NOTE — Anesthesia Procedure Notes (Addendum)
Performed by: Danique Hartsough D   Procedure Name: LMA Insertion Date/Time: 12/01/2014 1:53 PM Performed by: Awab Abebe D Pre-anesthesia Checklist: Patient identified, Emergency Drugs available, Suction available and Patient being monitored Patient Re-evaluated:Patient Re-evaluated prior to inductionOxygen Delivery Method: Circle System Utilized Preoxygenation: Pre-oxygenation with 100% oxygen Intubation Type: IV induction Ventilation: Mask ventilation without difficulty LMA: LMA inserted LMA Size: 4.0 Number of attempts: 1 Airway Equipment and Method: Bite block Placement Confirmation: positive ETCO2 Tube secured with: Tape Dental Injury: Teeth and Oropharynx as per pre-operative assessment

## 2014-12-01 NOTE — Op Note (Signed)
Dictation Number [id number]

## 2014-12-01 NOTE — Brief Op Note (Signed)
12/01/2014  2:33 PM  PATIENT:  Elijah Taylor  64 y.o. male  PRE-OPERATIVE DIAGNOSIS:  RIGHT CARPAL TUNNEL SYNDROME  POST-OPERATIVE DIAGNOSIS:  RIGHT CARPAL TUNNEL SYNDROME  PROCEDURE:  Procedure(s): RIGHT CARPAL TUNNEL RELEASE (Right)  SURGEON:  Surgeon(s) and Role:    * Daryll Brod, MD - Primary  PHYSICIAN ASSISTANT:   ASSISTANTS: none   ANESTHESIA:   local and general  EBL:  Total I/O In: 1000 [I.V.:1000] Out: -   BLOOD ADMINISTERED:none  DRAINS: none   LOCAL MEDICATIONS USED:  BUPIVICAINE   SPECIMEN:  No Specimen  DISPOSITION OF SPECIMEN:  N/A  COUNTS:  YES  TOURNIQUET:   Total Tourniquet Time Documented: Upper Arm (Right) - 15 minutes Total: Upper Arm (Right) - 15 minutes   DICTATION: .Other Dictation: Dictation Number [id number]  PLAN OF CARE: Discharge to home after PACU  PATIENT DISPOSITION:  PACU - hemodynamically stable.

## 2014-12-01 NOTE — Transfer of Care (Signed)
Immediate Anesthesia Transfer of Care Note  Patient: Elijah Taylor  Procedure(s) Performed: Procedure(s): RIGHT CARPAL TUNNEL RELEASE (Right)  Patient Location: PACU  Anesthesia Type:General  Level of Consciousness: awake and patient cooperative  Airway & Oxygen Therapy: Patient Spontanous Breathing and Patient connected to face mask oxygen  Post-op Assessment: Report given to RN and Post -op Vital signs reviewed and stable  Post vital signs: Reviewed and stable  Last Vitals:  Filed Vitals:   12/01/14 1217  BP: 123/78  Pulse: 100  Temp: 37.1 C  Resp: 20    Complications: No apparent anesthesia complications

## 2014-12-01 NOTE — Discharge Instructions (Signed)
Post Anesthesia Home Care Instructions  Activity: Get plenty of rest for the remainder of the day. A responsible adult should stay with you for 24 hours following the procedure.  For the next 24 hours, DO NOT: -Drive a car -Paediatric nurse -Drink alcoholic beverages -Take any medication unless instructed by your physician -Make any legal decisions or sign important papers.  Meals: Start with liquid foods such as gelatin or soup. Progress to regular foods as tolerated. Avoid greasy, spicy, heavy foods. If nausea and/or vomiting occur, drink only clear liquids until the nausea and/or vomiting subsides. Call your physician if vomiting continues.  Special Instructions/Symptoms: Your throat may feel dry or sore from the anesthesia or the breathing tube placed in your throat during surgery. If this causes discomfort, gargle with warm salt water. The discomfort should disappear within 24 hours.  If you had a scopolamine patch placed behind your ear for the management of post- operative nausea and/or vomiting:  1. The medication in the patch is effective for 72 hours, after which it should be removed.  Wrap patch in a tissue and discard in the trash. Wash hands thoroughly with soap and water. 2. You may remove the patch earlier than 72 hours if you experience unpleasant side effects which may include dry mouth, dizziness or visual disturbances. 3. Avoid touching the patch. Wash your hands with soap and water after contact with the patch.          HAND SURGERY    HOME CARE INSTRUCTIONS    The following instructions have been prepared to help you care for yourself upon your return home today.  Wound Care:  Keep your hand elevated above the level of your heart. Do not allow it to dangle by your side. Keep the dressing dry and do not remove it unless your doctor advises you to do so. He will usually change it at the time of you post-op visit. Moving your fingers is advised to stimulate  circulation but will depend on the site of your surgery. Of course, if you have a splint applied your doctor will advise you about movement.  Activity:  Do not drive or operate machinery today. Rest today and then you may return to your normal activity and work as indicated by your physician.  Diet: Drink liquids today or eat a light diet. You may resume a regular diet tomorrow.  General expectations: Pain for two or three days. Fingers may become slightly swollen.   Unexpected Observations- Call your doctor if any of these occur: Severe pain not relieved by pain medication. Elevated temperature. Dressing soaked with blood. Inability to move fingers. White or bluish color to fingers.           HAND SURGERY    HOME CARE INSTRUCTIONS    The following instructions have been prepared to help you care for yourself upon your return home today.  Wound Care:  Keep your hand elevated above the level of your heart. Do not allow it to dangle by your side. Keep the dressing dry and do not remove it unless your doctor advises you to do so. He will usually change it at the time of you post-op visit. Moving your fingers is advised to stimulate circulation but will depend on the site of your surgery. Of course, if you have a splint applied your doctor will advise you about movement.  Activity:  Do not drive or operate machinery today. Rest today and then you may return to your normal  activity and work as indicated by [employer].  Diet: Drink liquids today or eat a light diet. You may resume a regular diet tomorrow.  General expectations: Pain for two or three days. Fingers may become slightly swollen.   Unexpected Observations- Call your doctor if any of these occur: Severe pain not relieved by pain medication. Elevated temperature. Dressing soaked with blood. Inability to move fingers. White or bluish color to fingers.

## 2014-12-01 NOTE — H&P (Signed)
Elijah Taylor is a 64 year old right hand dominant male who comes in complaining of catching of his left index finger. This has been going on for 4 months. Dr. Derrel Nip has released 3 other fingers, right ring, left long and left ring in the past. He states this is identically the same. He recalls no history of injury. There is no history of diabetes, thyroid problems, arthritis or gout. He complains of constant, moderate, throbbing pain with tenderness directly over the MCP joint volar aspect of the left index finger. Activity makes it worse. He states moving it has helped. He is complaining of it becoming stiff.Nerve conductions were performed by Dr. Zebedee Iba revealing a motor delay of 5.3 on the right, sensory delay of 4.0 on the right with amplitude diminution to 4.7. His left side which was operated on continues to show some mild changes 4.3 in the motor, 2.6 in the sensory and 32 amplitude. He is advised this is probably residuals and probable scarring. We have discussed the possibility of surgery decompression to the median nerve of the hand  PAST MEDICAL HISTORY:  He has no known drug allergies. He is on aspirin and occasional sleeping pill. He has had prostate cancer, rotator cuff surgery, neck disc, trigger fingers, reconstruction left thumb, carpal tunnel releases, right middle finger release.  FAMILY MEDICAL HISTORY: Negative.  SOCIAL HISTORY:  He does not smoke or use alcohol. He is married and retired.  REVIEW OF SYSTEMS: Positive for glasses, ringing in his ears, sleep disorder, otherwise negative 14 points.  Elijah Taylor is an 64 y.o. male.   Chief Complaint: cts right HPI: see above  Past Medical History  Diagnosis Date  . Wears glasses   . Arthritis   . Cancer     prostate  . Insomnia   . Urinary bladder incontinence     Related to Prostectomy    Past Surgical History  Procedure Laterality Date  . Trigger finger release      x 4  . Shoulder arthroscopy w/ rotator  cuff repair      right and left  . Cervical fusion  2007  . Carpal tunnel release      left  . Colonoscopy    . Retropubic prostatectomy  2010  . Trigger finger release Left 09/02/2014    Procedure: RELEASE A-1 PULLEY LEFT INDEX FINGER;  Surgeon: Daryll Brod, MD;  Location: [facility];  Service: Orthopedics;  Laterality: Left;  ANESTHESIA: IV REGIONAL FAB    History reviewed. No pertinent family history. Social History:  reports that he has never smoked. He does not have any smokeless tobacco history on file. He reports that he drinks alcohol. He reports that he does not use illicit drugs.  Allergies: No Known Allergies  Medications Prior to Admission  Medication Sig Dispense Refill  . aspirin 81 MG tablet Take 81 mg by mouth daily.    . diphenhydramine-acetaminophen (TYLENOL PM) 25-500 MG TABS Take 1 tablet by mouth at bedtime as needed.    . Multiple Vitamins-Minerals (MULTIVITAMIN WITH MINERALS) tablet Take 1 tablet by mouth daily.    . Pseudoeph-Doxylamine-DM-APAP (NYQUIL PO) Take by mouth. As needed for sleep    . traZODone (DESYREL) 50 MG tablet Take 25 mg by mouth at bedtime. Takes 25mg  as needed HS      Results for orders placed or performed during the hospital encounter of 12/01/14 (from the past 48 hour(s))  Hemoglobin-hemacue, POC     Status: Abnormal  Collection Time: 12/01/14 12:36 PM  Result Value Ref Range   Hemoglobin 17.4 (H) 13.0 - 17.0 g/dL    No results found.   Pertinent items are noted in HPI.  Blood pressure 123/78, pulse 100, temperature 98.7 F (37.1 C), temperature source Oral, resp. rate 20, height 5\' 9"  (1.753 m), weight 93.441 kg (206 lb), SpO2 96 %.  General appearance: alert, cooperative and appears stated age Head: Normocephalic, without obvious abnormality Neck: no JVD Resp: clear to auscultation bilaterally Cardio: regular rate and rhythm, S1, S2 normal, no murmur, click, rub or gallop GI: soft, non-tender; bowel sounds  normal; no masses,  no organomegaly Extremities: numbness right hand Pulses: 2+ and symmetric Skin: Skin color, texture, turgor normal. No rashes or lesions Neurologic: Grossly normal Incision/Wound: na  Assessment/Plan  Pre, peri and post op care are discussed along with risks and complications. Patient is aware there is no guarantee with surgery, possibility of infection, injury to arteries, nerves, and tendons, incomplete relief and dystrophy. He is scheduled for right carpal tunnel release as an outpatient under regional anesthesia. He is advised that we are attempting to halt the process and hopefully it will get better. Any present scarring cannot be entirely removed and that he may have residuals.  Pailyn Bellevue R 12/01/2014, 1:30 PM

## 2014-12-01 NOTE — Anesthesia Preprocedure Evaluation (Signed)
Anesthesia Evaluation  Patient identified by MRN, date of birth, ID band Patient awake    Reviewed: Allergy & Precautions, NPO status , Patient's Chart, lab work & pertinent test results  Airway Mallampati: II  TM Distance: >3 FB Neck ROM: Full    Dental  (+) Teeth Intact, Dental Advisory Given   Pulmonary  breath sounds clear to auscultation        Cardiovascular Rhythm:Regular Rate:Normal     Neuro/Psych    GI/Hepatic   Endo/Other    Renal/GU      Musculoskeletal   Abdominal   Peds  Hematology   Anesthesia Other Findings   Reproductive/Obstetrics                             Anesthesia Physical Anesthesia Plan  ASA: II  Anesthesia Plan: General   Post-op Pain Management:    Induction: Intravenous  Airway Management Planned: LMA  Additional Equipment:   Intra-op Plan:   Post-operative Plan:   Informed Consent: I have reviewed the patients History and Physical, chart, labs and discussed the procedure including the risks, benefits and alternatives for the proposed anesthesia with the patient or authorized representative who has indicated his/her understanding and acceptance.   Dental advisory given  Plan Discussed with: CRNA and Anesthesiologist  Anesthesia Plan Comments: (R. Carpal Tunnel syndrome  Plan GA with LMA  Roberts Gaudy)        Anesthesia Quick Evaluation

## 2014-12-01 NOTE — Anesthesia Postprocedure Evaluation (Signed)
  Anesthesia Post-op Note  Patient: Elijah Taylor  Procedure(s) Performed: Procedure(s): RIGHT CARPAL TUNNEL RELEASE (Right)  Patient Location: PACU  Anesthesia Type: General   Level of Consciousness: awake, alert  and oriented  Airway and Oxygen Therapy: Patient Spontanous Breathing  Post-op Pain: mild  Post-op Assessment: Post-op Vital signs reviewed  Post-op Vital Signs: Reviewed  Last Vitals:  Filed Vitals:   12/01/14 1500  BP: 137/96  Pulse: 91  Temp:   Resp: 14    Complications: No apparent anesthesia complications

## 2014-12-02 ENCOUNTER — Encounter (HOSPITAL_BASED_OUTPATIENT_CLINIC_OR_DEPARTMENT_OTHER): Payer: Self-pay | Admitting: Orthopedic Surgery

## 2014-12-02 NOTE — Op Note (Signed)
NAMEDAREY, HERSHBERGER NO.:  [id number]  MEDICAL RECORD NO.:  [id number]  LOCATION:                                 FACILITY:  PHYSICIAN:  Daryll Brod, M.D.            DATE OF BIRTH:  DATE OF PROCEDURE:  12/01/2014 DATE OF DISCHARGE:                              OPERATIVE REPORT   PREOPERATIVE DIAGNOSIS:  Carpal tunnel syndrome, right hand.  POSTOPERATIVE DIAGNOSIS:  Carpal tunnel syndrome, right hand.  OPERATION:  Decompression of right median nerve.  SURGEON:  Daryll Brod, M.D.  ANESTHESIA:  General with local infiltration.  ANESTHESIOLOGIST:  Ala Dach, M.D.  HISTORY:  The patient is a 64 year old male with history of carpal tunnel syndrome, nerve conduction is positive.  This has not responded to conservative treatment.  Pre, peri, and postoperative course have been discussed along with risks and complications.  He is aware that there is no guarantee with the surgery; possibility of infection; recurrence of injury to arteries, nerves, tendons; incomplete relief of symptoms and dystrophy.  In the preoperative area, the patient is seen, the extremity marked by both the patient and surgeon and antibiotic given.  DESCRIPTION OF PROCEDURE:  The patient was brought to the operating room, where general anesthetic was carried out without difficulty.  He was prepped using ChloraPrep, supine position, right arm free.  A 3- minute dry time was allowed.  Time-out taken, confirming the patient and procedure.  The limb was exsanguinated with an Esmarch bandage. Tourniquet placed high on the arm was inflated to 250 mmHg.  A longitudinal incision was made in the right palm, carried down through the subcutaneous tissue.  Bleeders were electrocauterized with bipolar. Palmar fascia was split.  Superficial palmar arch was identified.  The flexor tendon of the ring and little finger identified to the ulnar side of the median nerve.  The carpal retinaculum was  incised with sharp dissection.  Right angle and Sewall retractor were placed between skin and forearm fascia.  Fascia was released for approximately 1.5 to 2 cm proximal to the wrist crease under direct vision.  Canal was explored. Air compression to the nerve was apparent and hourglass deformity. Motor branch entered into muscle distally.  The wound was copiously irrigated with saline and the skin closed with interrupted 4-0 nylon sutures.  A local infiltration with 0.25% bupivacaine without epinephrine was given, approximately 7-8 mL was used.  Sterile compressive dressing after closure of the wound was confirmed was applied.  On deflation of the tourniquet, all fingers were immediately pinked.  He was taken to the recovery room for observation in satisfactory condition.  He will be discharged to home to return to the [facility] in 1 week, on Norco.          ______________________________ Daryll Brod, M.D.     GK/MEDQ  D:  12/01/2014  T:  12/02/2014  Job:  [id number]

## 2015-02-23 DIAGNOSIS — N403 Nodular prostate with lower urinary tract symptoms: Secondary | ICD-10-CM | POA: Diagnosis not present

## 2015-02-23 DIAGNOSIS — D400 Neoplasm of uncertain behavior of prostate: Secondary | ICD-10-CM | POA: Diagnosis not present

## 2015-02-23 DIAGNOSIS — Z125 Encounter for screening for malignant neoplasm of prostate: Secondary | ICD-10-CM | POA: Diagnosis not present

## 2015-02-23 DIAGNOSIS — N393 Stress incontinence (female) (male): Secondary | ICD-10-CM | POA: Diagnosis not present

## 2015-03-30 ENCOUNTER — Ambulatory Visit (INDEPENDENT_AMBULATORY_CARE_PROVIDER_SITE_OTHER): Payer: Medicare Other | Admitting: Family Medicine

## 2015-03-30 ENCOUNTER — Encounter: Payer: Self-pay | Admitting: Family Medicine

## 2015-03-30 VITALS — BP 122/83 | HR 102 | Temp 99.0°F | Ht 69.1 in | Wt 207.4 lb

## 2015-03-30 DIAGNOSIS — M545 Low back pain: Secondary | ICD-10-CM

## 2015-03-30 DIAGNOSIS — M5459 Other low back pain: Secondary | ICD-10-CM

## 2015-03-30 MED ORDER — MELOXICAM 15 MG PO TABS: 15.0000 mg | ORAL_TABLET | Freq: Every day | ORAL | Status: DC

## 2015-03-30 MED ORDER — TIZANIDINE HCL 4 MG PO TABS: 4.0000 mg | ORAL_TABLET | Freq: Four times a day (QID) | ORAL | Status: DC | PRN

## 2015-03-30 NOTE — Patient Instructions (Signed)
Back Exercises These exercises may help you when beginning to rehabilitate your injury. Your symptoms may resolve with or without further involvement from your physician, physical therapist or athletic trainer. While completing these exercises, remember:   Restoring tissue flexibility helps normal motion to return to the joints. This allows healthier, less painful movement and activity.  An effective stretch should be held for at least 30 seconds.  A stretch should never be painful. You should only feel a gentle lengthening or release in the stretched tissue. STRETCH - Extension, Prone on Elbows   Lie on your stomach on the floor, a bed will be too soft. Place your palms about shoulder width apart and at the height of your head.  Place your elbows under your shoulders. If this is too painful, stack pillows under your chest.  Allow your body to relax so that your hips drop lower and make contact more completely with the floor.  Hold this position for __________ seconds.  Slowly return to lying flat on the floor. Repeat __________ times. Complete this exercise __________ times per day.  RANGE OF MOTION - Extension, Prone Press Ups   Lie on your stomach on the floor, a bed will be too soft. Place your palms about shoulder width apart and at the height of your head.  Keeping your back as relaxed as possible, slowly straighten your elbows while keeping your hips on the floor. You may adjust the placement of your hands to maximize your comfort. As you gain motion, your hands will come more underneath your shoulders.  Hold this position __________ seconds.  Slowly return to lying flat on the floor. Repeat __________ times. Complete this exercise __________ times per day.  RANGE OF MOTION- Quadruped, Neutral Spine   Assume a hands and knees position on a firm surface. Keep your hands under your shoulders and your knees under your hips. You may place padding under your knees for  comfort.  Drop your head and point your tail bone toward the ground below you. This will round out your low back like an angry cat. Hold this position for __________ seconds.  Slowly lift your head and release your tail bone so that your back sags into a large arch, like an old horse.  Hold this position for __________ seconds.  Repeat this until you feel limber in your low back.  Now, find your "sweet spot." This will be the most comfortable position somewhere between the two previous positions. This is your neutral spine. Once you have found this position, tense your stomach muscles to support your low back.  Hold this position for __________ seconds. Repeat __________ times. Complete this exercise __________ times per day.  STRETCH - Flexion, Single Knee to Chest   Lie on a firm bed or floor with both legs extended in front of you.  Keeping one leg in contact with the floor, bring your opposite knee to your chest. Hold your leg in place by either grabbing behind your thigh or at your knee.  Pull until you feel a gentle stretch in your low back. Hold __________ seconds.  Slowly release your grasp and repeat the exercise with the opposite side. Repeat __________ times. Complete this exercise __________ times per day.  STRETCH - Hamstrings, Standing  Stand or sit and extend your right / left leg, placing your foot on a chair or foot stool  Keeping a slight arch in your low back and your hips straight forward.  Lead with your chest and   lean forward at the waist until you feel a gentle stretch in the back of your right / left knee or thigh. (When done correctly, this exercise requires leaning only a small distance.)  Hold this position for __________ seconds. Repeat __________ times. Complete this stretch __________ times per day. STRENGTHENING - Deep Abdominals, Pelvic Tilt   Lie on a firm bed or floor. Keeping your legs in front of you, bend your knees so they are both pointed  toward the ceiling and your feet are flat on the floor.  Tense your lower abdominal muscles to press your low back into the floor. This motion will rotate your pelvis so that your tail bone is scooping upwards rather than pointing at your feet or into the floor.  With a gentle tension and even breathing, hold this position for __________ seconds. Repeat __________ times. Complete this exercise __________ times per day.  STRENGTHENING - Abdominals, Crunches   Lie on a firm bed or floor. Keeping your legs in front of you, bend your knees so they are both pointed toward the ceiling and your feet are flat on the floor. Cross your arms over your chest.  Slightly tip your chin down without bending your neck.  Tense your abdominals and slowly lift your trunk high enough to just clear your shoulder blades. Lifting higher can put excessive stress on the low back and does not further strengthen your abdominal muscles.  Control your return to the starting position. Repeat __________ times. Complete this exercise __________ times per day.  STRENGTHENING - Quadruped, Opposite UE/LE Lift   Assume a hands and knees position on a firm surface. Keep your hands under your shoulders and your knees under your hips. You may place padding under your knees for comfort.  Find your neutral spine and gently tense your abdominal muscles so that you can maintain this position. Your shoulders and hips should form a rectangle that is parallel with the floor and is not twisted.  Keeping your trunk steady, lift your right hand no higher than your shoulder and then your left leg no higher than your hip. Make sure you are not holding your breath. Hold this position __________ seconds.  Continuing to keep your abdominal muscles tense and your back steady, slowly return to your starting position. Repeat with the opposite arm and leg. Repeat __________ times. Complete this exercise __________ times per day. Document Released:  08/12/2005 Document Revised: 10/17/2011 Document Reviewed: 11/06/2008 Exit[employer] Patient Information 2015 [employer]. This information is not intended to replace advice given to you by your health care provider. Make sure you discuss any questions you have with your health care provider.  

## 2015-03-30 NOTE — Progress Notes (Signed)
BP 122/83 mmHg  Pulse 102  Temp(Src) 99 F (37.2 C)  Ht 5' 9.1" (1.755 m)  Wt 207 lb 6.4 oz (94.076 kg)  BMI 30.54 kg/m2  SpO2 94%   Subjective:    Patient ID: Elijah Taylor, male    DOB: July 29, 1951, 64 y.o.   MRN: [id number]  HPI: Elijah Taylor is a 64 y.o. male  Chief Complaint  Patient presents with  . Back Pain    X 5 days, no injury known   BACK PAIN Duration: 5-7 days Mechanism of injury: no trauma Location: Right and low back Onset: sudden Severity: moderate Quality: sharp Frequency: intermittent Radiation: R leg above the knee Aggravating factors: lifting and prolonged sitting Alleviating factors: nothing Status: better Treatments attempted: muscle relaxer, rest and heat  Relief with NSAIDs?: No NSAIDs Taken Nighttime pain:  no Paresthesias / decreased sensation:  no Bowel / bladder incontinence:  no Fevers:  no Dysuria / urinary frequency:  no  Relevant past medical, surgical, family and social history reviewed and updated as indicated. Interim medical history since our last visit reviewed. Allergies and medications reviewed and updated.  Review of Systems  Constitutional: Negative.   Respiratory: Negative.   Cardiovascular: Negative.   Musculoskeletal: Positive for back pain. Negative for myalgias, joint swelling, arthralgias, gait problem, neck pain and neck stiffness.  Psychiatric/Behavioral: Negative.    Per HPI unless specifically indicated above     Objective:    BP 122/83 mmHg  Pulse 102  Temp(Src) 99 F (37.2 C)  Ht 5' 9.1" (1.755 m)  Wt 207 lb 6.4 oz (94.076 kg)  BMI 30.54 kg/m2  SpO2 94%  Wt Readings from Last 3 Encounters:  03/30/15 207 lb 6.4 oz (94.076 kg)  12/01/14 206 lb (93.441 kg)  11/20/14 205 lb (92.987 kg)    Physical Exam  Constitutional: He is oriented to person, place, and time. He appears well-developed and well-nourished. No distress.  HENT:  Head: Normocephalic and atraumatic.  Right Ear: Hearing  normal.  Left Ear: Hearing normal.  Nose: Nose normal.  Eyes: Conjunctivae and lids are normal. Right eye exhibits no discharge. Left eye exhibits no discharge. No scleral icterus.  Pulmonary/Chest: Effort normal. No respiratory distress.  Neurological: He is alert and oriented to person, place, and time.  Skin: Skin is warm, dry and intact. No rash noted. No erythema. No pallor.  Psychiatric: He has a normal mood and affect. His speech is normal and behavior is normal. Judgment and thought content normal. Cognition and memory are normal.  Nursing note and vitals reviewed. Back Exam:    Inspection:  Normal spinal curvature.  No deformity, ecchymosis, erythema, or lesions     Palpation:     Midline spinal tenderness: no      Paralumbar tenderness: yes     Parathoracic tenderness: no     Buttocks tenderness: yesRight     Range of Motion:      Flexion: Fingers to Knees     Extension:Decreased     Lateral bending:Decreased    Rotation:Decreased    Neuro Exam:Lower extremity DTRs normal & symmetric.  Strength and sensation intact.     Patellar DTRs: Normal, Symmetric and 2/4     Ankle dorsiflexion strength:Within Normal Limits     Sensation(medial malleolus):Within Normal Limits     Great toe dorsiflexion strength: Within Normal Limits     Sensation (mid dorsal foot):Within Normal Limits     Ankle DTRs: Normal, Symmetric and 2/4  Ankle plantar flexion strength:Within Normal Limits     Sensation (lateral heel):Within Normal Limits    Special Tests:      Straight leg raise:positive bilaterally R>L  Results for orders placed or performed during the hospital encounter of 12/01/14  Hemoglobin-hemacue, POC  Result Value Ref Range   Hemoglobin 17.4 (H) 13.0 - 17.0 g/dL      Assessment & Plan:   Problem List Items Addressed This Visit    None    Visit Diagnoses    Acute mechanical low back pain with duration of less than six weeks    -  Primary    Normal neurologic exam. Will  treat with muscle relaxer and meloxicam and do stretches. If not better in 2 weeks, will have him return for ? x-ray and ?PT.     Relevant Medications    meloxicam (MOBIC) 15 MG tablet    tiZANidine (ZANAFLEX) 4 MG tablet        Follow up plan: Return in about 2 weeks (around 04/13/2015) for follow up on back pain if not better.

## 2015-04-08 ENCOUNTER — Ambulatory Visit (INDEPENDENT_AMBULATORY_CARE_PROVIDER_SITE_OTHER): Payer: Medicare Other | Admitting: Family Medicine

## 2015-04-08 ENCOUNTER — Ambulatory Visit
Admission: RE | Admit: 2015-04-08 | Discharge: 2015-04-08 | Disposition: A | Discharge status: Home / Self Care | Payer: Medicare Other | Source: Ambulatory Visit | Attending: Family Medicine | Admitting: Family Medicine

## 2015-04-08 ENCOUNTER — Telehealth: Payer: Self-pay | Admitting: Family Medicine

## 2015-04-08 ENCOUNTER — Encounter: Payer: Self-pay | Admitting: Family Medicine

## 2015-04-08 VITALS — BP 117/68 | HR 97 | Temp 98.3°F | Wt 206.0 lb

## 2015-04-08 DIAGNOSIS — M5441 Lumbago with sciatica, right side: Secondary | ICD-10-CM

## 2015-04-08 DIAGNOSIS — M545 Low back pain: Secondary | ICD-10-CM

## 2015-04-08 DIAGNOSIS — M5136 Other intervertebral disc degeneration, lumbar region: Secondary | ICD-10-CM | POA: Diagnosis not present

## 2015-04-08 DIAGNOSIS — M79604 Pain in right leg: Secondary | ICD-10-CM

## 2015-04-08 MED ORDER — OXYCODONE-ACETAMINOPHEN 10-325 MG PO TABS: 1.0000 | ORAL_TABLET | Freq: Three times a day (TID) | ORAL | Status: DC | PRN

## 2015-04-08 MED ORDER — TIZANIDINE HCL 4 MG PO TABS: 8.0000 mg | ORAL_TABLET | Freq: Three times a day (TID) | ORAL | Status: DC | PRN

## 2015-04-08 NOTE — Progress Notes (Addendum)
BP 117/68 mmHg  Pulse 97  Temp(Src) 98.3 F (36.8 C)  Wt 206 lb (93.441 kg)  SpO2 96%   Subjective:    Patient ID: Elijah Taylor, male    DOB: 1950-08-13, 64 y.o.   MRN: [id number]  HPI: Elijah Taylor is a 64 y.o. male  Chief Complaint  Patient presents with  . Back Pain    x 2-3 weeks, no known injury   BACK PAIN- has not been doing his exercises. Burning pain resolved but back pain is just as bad. He is very frustrated and would like an MRI.  Duration: 2.5 weeks in exacerbation, 30 years off and on with weeks where he would throw out his back and then it would calm down again. Mechanism of injury: no trauma Location: R>L and low back Onset: sudden Severity: severe Quality: sharp, aching, burning, shooting and stabbing Frequency: constant Radiation: R leg above the knee Aggravating factors: lifting, movement, walking and bending Alleviating factors: laying Status: stable Treatments attempted: rest, ice, heat, APAP, ibuprofen, aleve and HEP  Relief with NSAIDs?: no Nighttime pain:  no Paresthesias / decreased sensation:  no Bowel / bladder incontinence:  no Fevers:  no Dysuria / urinary frequency:  no  Relevant past medical, surgical, family and social history reviewed and updated as indicated. Interim medical history since our last visit reviewed. Allergies and medications reviewed and updated.  Review of Systems  Constitutional: Negative.   Respiratory: Negative.   Cardiovascular: Negative.   Gastrointestinal: Negative.   Musculoskeletal: Positive for myalgias, back pain and gait problem. Negative for joint swelling, arthralgias, neck pain and neck stiffness.  Psychiatric/Behavioral: Negative.     Per HPI unless specifically indicated above     Objective:    BP 117/68 mmHg  Pulse 97  Temp(Src) 98.3 F (36.8 C)  Wt 206 lb (93.441 kg)  SpO2 96%  Wt Readings from Last 3 Encounters:  04/08/15 206 lb (93.441 kg)  03/30/15 207 lb 6.4 oz (94.076 kg)   12/01/14 206 lb (93.441 kg)    Physical Exam  Constitutional: He is oriented to person, place, and time. He appears well-developed and well-nourished. No distress.  HENT:  Head: Normocephalic and atraumatic.  Right Ear: Hearing normal.  Left Ear: Hearing normal.  Nose: Nose normal.  Eyes: Conjunctivae and lids are normal. Right eye exhibits no discharge. Left eye exhibits no discharge. No scleral icterus.  Pulmonary/Chest: Effort normal. No respiratory distress.  Neurological: He is alert and oriented to person, place, and time.  Skin: Skin is intact. No rash noted.  Psychiatric: He has a normal mood and affect. His speech is normal and behavior is normal. Judgment and thought content normal. Cognition and memory are normal.  Nursing note and vitals reviewed. Back Exam:    Inspection:  Normal spinal curvature.  No deformity, ecchymosis, erythema, or lesions     Palpation:     Midline spinal tenderness: no      Paralumbar tenderness: yes R>L     Parathoracic tenderness: no      Buttocks tenderness: yesR>L     Range of Motion:      Flexion: Fingers to Knees     Extension:Decreased     Lateral bending:Decreased    Rotation:Decreased    Neuro Exam:Lower extremity DTRs normal & symmetric.  Strength and sensation intact.    Special Tests:      Straight leg raise:negative   Results for orders placed or performed during the hospital encounter of 12/01/14  Hemoglobin-hemacue, POC  Result Value Ref Range   Hemoglobin 17.4 (H) 13.0 - 17.0 g/dL      Assessment & Plan:   Problem List Items Addressed This Visit    None    Visit Diagnoses    Right-sided low back pain with right-sided sciatica    -  Primary    No benefit from current regimen. Will treat pain. Increase zanaflex. Normal neurologic exam. Pt wants xray ordered today. Will do PT when back from vacation.     Relevant Medications    oxyCODONE-acetaminophen (PERCOCET) 10-325 MG per tablet    tiZANidine (ZANAFLEX) 4 MG  tablet    Other Relevant Orders    DG Lumbar Spine Complete        Follow up plan: Return Following vacation if not better.

## 2015-04-08 NOTE — Telephone Encounter (Signed)
Called patient with the results of his x-ray which shows loss of height and facet arthropathy. Will move forward with MRI for acute on chronic back pain. Ordered today. Await results.

## 2015-04-08 NOTE — Patient Instructions (Signed)

## 2015-04-17 ENCOUNTER — Ambulatory Visit
Admission: RE | Admit: 2015-04-17 | Discharge: 2015-04-17 | Disposition: A | Discharge status: Home / Self Care | Payer: Medicare Other | Source: Ambulatory Visit | Attending: Family Medicine | Admitting: Family Medicine

## 2015-04-17 DIAGNOSIS — M545 Low back pain: Secondary | ICD-10-CM

## 2015-04-17 DIAGNOSIS — M5387 Other specified dorsopathies, lumbosacral region: Secondary | ICD-10-CM | POA: Insufficient documentation

## 2015-04-17 DIAGNOSIS — M4806 Spinal stenosis, lumbar region: Secondary | ICD-10-CM | POA: Diagnosis not present

## 2015-04-17 DIAGNOSIS — M5386 Other specified dorsopathies, lumbar region: Secondary | ICD-10-CM | POA: Diagnosis not present

## 2015-04-17 DIAGNOSIS — M79604 Pain in right leg: Secondary | ICD-10-CM

## 2015-04-24 ENCOUNTER — Telehealth: Payer: Self-pay | Admitting: Family Medicine

## 2015-04-24 ENCOUNTER — Encounter: Payer: Self-pay | Admitting: Family Medicine

## 2015-04-24 ENCOUNTER — Other Ambulatory Visit: Payer: Self-pay | Admitting: Family Medicine

## 2015-04-24 DIAGNOSIS — M9983 Other biomechanical lesions of lumbar region: Principal | ICD-10-CM

## 2015-04-24 DIAGNOSIS — M48061 Spinal stenosis, lumbar region without neurogenic claudication: Secondary | ICD-10-CM

## 2015-04-24 NOTE — Telephone Encounter (Signed)
Forward to provider

## 2015-04-24 NOTE — Telephone Encounter (Signed)
Called and spoke to patient about MRI results. Would like to see Dr. Ronnald Ramp at [facility]. Referral generated.

## 2015-04-24 NOTE — Telephone Encounter (Signed)
yours

## 2015-04-24 NOTE — Telephone Encounter (Signed)
Pt called stated he is returning Dr. Durenda Age call regarding MRI results. Please call at your earliest convenience. Thanks.

## 2015-04-26 ENCOUNTER — Other Ambulatory Visit: Payer: Self-pay | Admitting: Family Medicine

## 2015-05-05 DIAGNOSIS — M545 Low back pain: Secondary | ICD-10-CM | POA: Diagnosis not present

## 2015-05-18 DIAGNOSIS — M5126 Other intervertebral disc displacement, lumbar region: Secondary | ICD-10-CM | POA: Diagnosis not present

## 2015-05-18 DIAGNOSIS — Z6830 Body mass index (BMI) 30.0-30.9, adult: Secondary | ICD-10-CM | POA: Diagnosis not present

## 2015-05-18 DIAGNOSIS — M5416 Radiculopathy, lumbar region: Secondary | ICD-10-CM | POA: Diagnosis not present

## 2015-05-19 DIAGNOSIS — M5126 Other intervertebral disc displacement, lumbar region: Secondary | ICD-10-CM | POA: Diagnosis not present

## 2015-05-19 DIAGNOSIS — M5127 Other intervertebral disc displacement, lumbosacral region: Secondary | ICD-10-CM | POA: Diagnosis not present

## 2015-05-19 DIAGNOSIS — M545 Low back pain: Secondary | ICD-10-CM | POA: Diagnosis not present

## 2015-05-19 DIAGNOSIS — R293 Abnormal posture: Secondary | ICD-10-CM | POA: Diagnosis not present

## 2015-05-20 DIAGNOSIS — M5416 Radiculopathy, lumbar region: Secondary | ICD-10-CM | POA: Diagnosis not present

## 2015-05-20 DIAGNOSIS — Z6830 Body mass index (BMI) 30.0-30.9, adult: Secondary | ICD-10-CM | POA: Diagnosis not present

## 2015-05-20 DIAGNOSIS — M5126 Other intervertebral disc displacement, lumbar region: Secondary | ICD-10-CM | POA: Diagnosis not present

## 2015-05-22 DIAGNOSIS — M5126 Other intervertebral disc displacement, lumbar region: Secondary | ICD-10-CM | POA: Diagnosis not present

## 2015-05-22 DIAGNOSIS — R293 Abnormal posture: Secondary | ICD-10-CM | POA: Diagnosis not present

## 2015-05-22 DIAGNOSIS — M5127 Other intervertebral disc displacement, lumbosacral region: Secondary | ICD-10-CM | POA: Diagnosis not present

## 2015-05-22 DIAGNOSIS — M545 Low back pain: Secondary | ICD-10-CM | POA: Diagnosis not present

## 2015-05-25 DIAGNOSIS — R293 Abnormal posture: Secondary | ICD-10-CM | POA: Diagnosis not present

## 2015-05-25 DIAGNOSIS — M545 Low back pain: Secondary | ICD-10-CM | POA: Diagnosis not present

## 2015-05-25 DIAGNOSIS — M5126 Other intervertebral disc displacement, lumbar region: Secondary | ICD-10-CM | POA: Diagnosis not present

## 2015-05-25 DIAGNOSIS — M5127 Other intervertebral disc displacement, lumbosacral region: Secondary | ICD-10-CM | POA: Diagnosis not present

## 2015-05-28 DIAGNOSIS — M5126 Other intervertebral disc displacement, lumbar region: Secondary | ICD-10-CM | POA: Diagnosis not present

## 2015-05-28 DIAGNOSIS — R293 Abnormal posture: Secondary | ICD-10-CM | POA: Diagnosis not present

## 2015-05-28 DIAGNOSIS — M5127 Other intervertebral disc displacement, lumbosacral region: Secondary | ICD-10-CM | POA: Diagnosis not present

## 2015-05-28 DIAGNOSIS — M545 Low back pain: Secondary | ICD-10-CM | POA: Diagnosis not present

## 2015-06-01 DIAGNOSIS — M545 Low back pain: Secondary | ICD-10-CM | POA: Diagnosis not present

## 2015-06-01 DIAGNOSIS — R293 Abnormal posture: Secondary | ICD-10-CM | POA: Diagnosis not present

## 2015-06-01 DIAGNOSIS — M5126 Other intervertebral disc displacement, lumbar region: Secondary | ICD-10-CM | POA: Diagnosis not present

## 2015-06-01 DIAGNOSIS — M5127 Other intervertebral disc displacement, lumbosacral region: Secondary | ICD-10-CM | POA: Diagnosis not present

## 2015-06-09 DIAGNOSIS — M5126 Other intervertebral disc displacement, lumbar region: Secondary | ICD-10-CM | POA: Diagnosis not present

## 2015-06-09 DIAGNOSIS — M545 Low back pain: Secondary | ICD-10-CM | POA: Diagnosis not present

## 2015-06-09 DIAGNOSIS — R293 Abnormal posture: Secondary | ICD-10-CM | POA: Diagnosis not present

## 2015-06-09 DIAGNOSIS — M5127 Other intervertebral disc displacement, lumbosacral region: Secondary | ICD-10-CM | POA: Diagnosis not present

## 2015-06-22 DIAGNOSIS — M545 Low back pain: Secondary | ICD-10-CM | POA: Diagnosis not present

## 2015-06-22 DIAGNOSIS — M5127 Other intervertebral disc displacement, lumbosacral region: Secondary | ICD-10-CM | POA: Diagnosis not present

## 2015-06-22 DIAGNOSIS — M5126 Other intervertebral disc displacement, lumbar region: Secondary | ICD-10-CM | POA: Diagnosis not present

## 2015-06-22 DIAGNOSIS — R293 Abnormal posture: Secondary | ICD-10-CM | POA: Diagnosis not present

## 2015-06-23 DIAGNOSIS — M5126 Other intervertebral disc displacement, lumbar region: Secondary | ICD-10-CM | POA: Diagnosis not present

## 2015-06-23 DIAGNOSIS — Z6830 Body mass index (BMI) 30.0-30.9, adult: Secondary | ICD-10-CM | POA: Diagnosis not present

## 2015-06-29 DIAGNOSIS — R293 Abnormal posture: Secondary | ICD-10-CM | POA: Diagnosis not present

## 2015-06-29 DIAGNOSIS — M545 Low back pain: Secondary | ICD-10-CM | POA: Diagnosis not present

## 2015-06-29 DIAGNOSIS — M5127 Other intervertebral disc displacement, lumbosacral region: Secondary | ICD-10-CM | POA: Diagnosis not present

## 2015-06-29 DIAGNOSIS — M5126 Other intervertebral disc displacement, lumbar region: Secondary | ICD-10-CM | POA: Diagnosis not present

## 2015-09-08 DIAGNOSIS — Z125 Encounter for screening for malignant neoplasm of prostate: Secondary | ICD-10-CM | POA: Diagnosis not present

## 2015-09-08 DIAGNOSIS — D400 Neoplasm of uncertain behavior of prostate: Secondary | ICD-10-CM | POA: Diagnosis not present

## 2015-09-08 DIAGNOSIS — N393 Stress incontinence (female) (male): Secondary | ICD-10-CM | POA: Diagnosis not present

## 2015-09-08 DIAGNOSIS — R351 Nocturia: Secondary | ICD-10-CM | POA: Diagnosis not present

## 2015-09-10 ENCOUNTER — Other Ambulatory Visit: Payer: Medicare Other

## 2015-09-10 ENCOUNTER — Encounter: Payer: Self-pay | Admitting: *Deleted

## 2015-09-10 NOTE — Patient Instructions (Signed)
  Your procedure is scheduled on: 09-15-15 (TUESDAY) Report to [place] To find out your arrival time please call [phone] between 1PM - 3PM on 09-14-15 [facility])  Remember: Instructions that are not followed completely may result in serious medical risk, up to and including death, or upon the discretion of your surgeon and anesthesiologist your surgery may need to be rescheduled.    _X___ 1. Do not eat food or drink liquids after midnight. No gum chewing or hard candies.     _X___ 2. No Alcohol for 24 hours before or after surgery.   ____ 3. Bring all medications with you on the day of surgery if instructed.    _X___ 4. Notify your doctor if there is any change in your medical condition     (cold, fever, infections).     Do not wear jewelry, make-up, hairpins, clips or nail polish.  Do not wear lotions, powders, or perfumes. You may wear deodorant.  Do not shave 48 hours prior to surgery. Men may shave face and neck.  Do not bring valuables to the hospital.    [facility] is not responsible for any belongings or valuables.               Contacts, dentures or bridgework may not be worn into surgery.  Leave your suitcase in the car. After surgery it may be brought to your room.  For patients admitted to the hospital, discharge time is determined by your treatment team.   Patients discharged the day of surgery will not be allowed to drive home.   Please read over the following fact sheets that you were given:    ____ Take these medicines the morning of surgery with A SIP OF WATER:    1. NONE  2.   3.   4.  5.  6.  ____ Fleet Enema (as directed)   ____ Use CHG Soap as directed  ____ Use inhalers on the day of surgery  ____ Stop metformin 2 days prior to surgery    ____ Take 1/2 of usual insulin dose the night before surgery and none on the morning of surgery.   __X__ Stop Coumadin/Plavix/aspirin-PT STOPPED ASA 09-09-15  ____ Stop  Anti-inflammatories-NO NSAIDS OR ASA PRODUCTS-TYLENOL OK TO TAKE   ____ Stop supplements until after surgery.    ____ Bring C-Pap to the hospital.

## 2015-09-11 NOTE — H&P (Signed)
NAME:  Elijah Taylor, Elijah Taylor NO.:  [id number]  MEDICAL RECORD NO.:  [id number]  LOCATION:                               FACILITY:  [facility]  PHYSICIAN:  Maryan Puls          DATE OF BIRTH:  1950-10-17  DATE OF ADMISSION:  09/15/2015 DATE OF DISCHARGE:                            HISTORY AND PHYSICAL   SAME-DAY SURGERY:  September 15, 2015.  CHIEF COMPLAINT:  Urinary incontinence.  HISTORY OF PRESENT ILLNESS:  Elijah Taylor is a 65 year old Caucasian male with a 2- to 3-year history of stress incontinence.  He was treated with Macroplastique injection in 2014 with good results.  However, his incontinence has recurred in the last 6 months.  He comes in now for Macroplastique injection.  ALLERGIES:  NO DRUG ALLERGIES.  CURRENT MEDICATIONS:  Included aspirin.  PREVIOUS SURGICAL PROCEDURES:  Included; 1. Carpal tunnel repair in 2004. 2. C-spine surgery repair in 2006. 3. Left rotator cuff repair in 2009. 4. Right rotator cuff repair in 2010. 5. Radical prostatectomy in 2011. 6. Release of trigger finger in 2016. 7. Carpal tunnel repair in 2016.  PAST AND CURRENT MEDICAL CONDITIONS:  Degenerative joint disease.  REVIEW OF SYSTEMS:  The patient denied chest pain, shortness of breath, diabetes, stroke, or hypertension.  FAMILY HISTORY:  The patient's father has prostate cancer.  Siblings have heart disease and chronic renal disease.  SOCIAL HISTORY:  The patient denies tobacco or alcohol use.  PHYSICAL EXAMINATION:  GENERALLY:  Well-nourished white male, in no acute distress. HEENT:  Sclerae were clear. NECK:  Supple.  No palpable cervical adenopathy. LUNGS:  Clear to auscultation. CARDIOVASCULAR:  Regular rhythm and rate without audible murmurs. ABDOMEN:  Soft, nontender abdomen. GU:  Circumcised testes, smooth, nontender, 20 mL size each rectal exam. No palpable rectal masses. NEUROMUSCULAR:  Alert and oriented x3.  IMPRESSION:  Urinary  incontinence.  PLAN:  Macroplastique injection.          ______________________________ Maryan Puls     MW/MEDQ  D:  09/10/2015  T:  09/11/2015  Job:  [id number]

## 2015-09-15 ENCOUNTER — Ambulatory Visit
Admission: RE | Admit: 2015-09-15 | Discharge: 2015-09-15 | Disposition: A | Discharge status: Home / Self Care | Payer: Medicare Other | Source: Ambulatory Visit | Attending: Urology | Admitting: Urology

## 2015-09-15 ENCOUNTER — Encounter
Admission: RE | Disposition: A | Discharge status: Home / Self Care | Payer: Self-pay | Source: Ambulatory Visit | Attending: Urology

## 2015-09-15 ENCOUNTER — Ambulatory Visit: Payer: Medicare Other | Admitting: Anesthesiology

## 2015-09-15 ENCOUNTER — Encounter: Payer: Self-pay | Admitting: Anesthesiology

## 2015-09-15 DIAGNOSIS — N189 Chronic kidney disease, unspecified: Secondary | ICD-10-CM | POA: Diagnosis not present

## 2015-09-15 DIAGNOSIS — N393 Stress incontinence (female) (male): Secondary | ICD-10-CM | POA: Insufficient documentation

## 2015-09-15 DIAGNOSIS — Z9889 Other specified postprocedural states: Secondary | ICD-10-CM | POA: Insufficient documentation

## 2015-09-15 DIAGNOSIS — Z7982 Long term (current) use of aspirin: Secondary | ICD-10-CM | POA: Insufficient documentation

## 2015-09-15 DIAGNOSIS — K219 Gastro-esophageal reflux disease without esophagitis: Secondary | ICD-10-CM | POA: Diagnosis not present

## 2015-09-15 DIAGNOSIS — Z841 Family history of disorders of kidney and ureter: Secondary | ICD-10-CM | POA: Insufficient documentation

## 2015-09-15 DIAGNOSIS — Z8249 Family history of ischemic heart disease and other diseases of the circulatory system: Secondary | ICD-10-CM | POA: Diagnosis not present

## 2015-09-15 DIAGNOSIS — Z8042 Family history of malignant neoplasm of prostate: Secondary | ICD-10-CM | POA: Insufficient documentation

## 2015-09-15 DIAGNOSIS — R32 Unspecified urinary incontinence: Secondary | ICD-10-CM | POA: Diagnosis not present

## 2015-09-15 HISTORY — DX: Gastro-esophageal reflux disease without esophagitis: K21.9

## 2015-09-15 HISTORY — DX: Chronic kidney disease, unspecified: N18.9

## 2015-09-15 HISTORY — PX: CYSTOSCOPY MACROPLASTIQUE IMPLANT: SHX6636

## 2015-09-15 SURGERY — CYSTOSCOPY, WITH MACROPLASTIQUE INJECTION
Anesthesia: General | Site: Urethra | Wound class: Clean Contaminated

## 2015-09-15 MED ORDER — FAMOTIDINE 20 MG PO TABS
20.0000 mg | ORAL_TABLET | Freq: Once | ORAL | Status: AC
  Administered 2015-09-15: 20 mg via ORAL

## 2015-09-15 MED ORDER — FENTANYL CITRATE (PF) 100 MCG/2ML IJ SOLN
INTRAMUSCULAR | Status: DC | PRN
  Administered 2015-09-15 (×4): 25 ug via INTRAVENOUS
  Administered 2015-09-15: 50 ug via INTRAVENOUS
  Administered 2015-09-15 (×2): 25 ug via INTRAVENOUS

## 2015-09-15 MED ORDER — CEFAZOLIN SODIUM-DEXTROSE 2-3 GM-% IV SOLR
INTRAVENOUS | Status: AC
  Administered 2015-09-15: 2000 mg
  Filled 2015-09-15: qty 50

## 2015-09-15 MED ORDER — CEFAZOLIN SODIUM 1-5 GM-% IV SOLN
1.0000 g | Freq: Once | INTRAVENOUS | Status: AC
  Administered 2015-09-15: 1 g via INTRAVENOUS

## 2015-09-15 MED ORDER — BELLADONNA ALKALOIDS-OPIUM 16.2-60 MG RE SUPP
RECTAL | Status: DC | PRN
  Administered 2015-09-15: 1 via RECTAL

## 2015-09-15 MED ORDER — NUCYNTA 50 MG PO TABS: 50.0000 mg | ORAL_TABLET | Freq: Four times a day (QID) | ORAL | Status: DC | PRN

## 2015-09-15 MED ORDER — LIDOCAINE HCL (CARDIAC) 20 MG/ML IV SOLN
INTRAVENOUS | Status: DC | PRN
  Administered 2015-09-15: 60 mg via INTRAVENOUS

## 2015-09-15 MED ORDER — OXYCODONE HCL 5 MG PO TABS: 5.0000 mg | ORAL_TABLET | Freq: Once | ORAL | Status: DC | PRN

## 2015-09-15 MED ORDER — ONDANSETRON HCL 4 MG/2ML IJ SOLN
INTRAMUSCULAR | Status: DC | PRN
  Administered 2015-09-15: 4 mg via INTRAVENOUS

## 2015-09-15 MED ORDER — URIBEL 118 MG PO CAPS: 1.0000 | ORAL_CAPSULE | Freq: Four times a day (QID) | ORAL | Status: DC | PRN

## 2015-09-15 MED ORDER — LACTATED RINGERS IV SOLN
INTRAVENOUS | Status: DC
  Administered 2015-09-15: 17:00:00 via INTRAVENOUS

## 2015-09-15 MED ORDER — CEFAZOLIN SODIUM 1-5 GM-% IV SOLN
INTRAVENOUS | Status: AC
  Filled 2015-09-15: qty 50

## 2015-09-15 MED ORDER — FAMOTIDINE 20 MG PO TABS
ORAL_TABLET | ORAL | Status: AC
  Administered 2015-09-15: 20 mg via ORAL
  Filled 2015-09-15: qty 1

## 2015-09-15 MED ORDER — FENTANYL CITRATE (PF) 100 MCG/2ML IJ SOLN
25.0000 ug | INTRAMUSCULAR | Status: DC | PRN
  Administered 2015-09-15: 50 ug via INTRAVENOUS
  Administered 2015-09-15 (×2): 25 ug via INTRAVENOUS

## 2015-09-15 MED ORDER — FENTANYL CITRATE (PF) 100 MCG/2ML IJ SOLN
INTRAMUSCULAR | Status: AC
  Administered 2015-09-15: 25 ug via INTRAVENOUS
  Filled 2015-09-15: qty 2

## 2015-09-15 MED ORDER — OXYCODONE HCL 5 MG/5ML PO SOLN: 5.0000 mg | Freq: Once | ORAL | Status: DC | PRN

## 2015-09-15 MED ORDER — BELLADONNA ALKALOIDS-OPIUM 16.2-60 MG RE SUPP
RECTAL | Status: AC
  Filled 2015-09-15: qty 1

## 2015-09-15 MED ORDER — LEVOFLOXACIN 500 MG PO TABS: 500.0000 mg | ORAL_TABLET | Freq: Every day | ORAL | Status: DC

## 2015-09-15 MED ORDER — MIDAZOLAM HCL 5 MG/5ML IJ SOLN
INTRAMUSCULAR | Status: DC | PRN
  Administered 2015-09-15: 2 mg via INTRAVENOUS

## 2015-09-15 MED ORDER — LIDOCAINE HCL 2 % EX GEL
CUTANEOUS | Status: AC
  Filled 2015-09-15: qty 10

## 2015-09-15 MED ORDER — LIDOCAINE HCL 2 % EX GEL
CUTANEOUS | Status: DC | PRN
  Administered 2015-09-15: 1

## 2015-09-15 MED ORDER — PROPOFOL 10 MG/ML IV BOLUS
INTRAVENOUS | Status: DC | PRN
  Administered 2015-09-15: 10 mg via INTRAVENOUS
  Administered 2015-09-15: 170 mg via INTRAVENOUS
  Administered 2015-09-15: 30 mg via INTRAVENOUS

## 2015-09-15 SURGICAL SUPPLY — 19 items
BAG DRAIN CYSTO-URO LG1000N (MISCELLANEOUS) ×3 IMPLANT
CATH ROBINSON RED A/P 12FR (CATHETERS) ×3 IMPLANT
GLOVE BIO SURGEON STRL SZ7 (GLOVE) ×6 IMPLANT
GLOVE BIO SURGEON STRL SZ7.5 (GLOVE) ×3 IMPLANT
GOWN STRL REUS W/ TWL LRG LVL3 (GOWN DISPOSABLE) ×1 IMPLANT
GOWN STRL REUS W/ TWL XL LVL3 (GOWN DISPOSABLE) ×1 IMPLANT
GOWN STRL REUS W/TWL LRG LVL3 (GOWN DISPOSABLE) ×3
GOWN STRL REUS W/TWL XL LVL3 (GOWN DISPOSABLE) ×3
INJECTION MACROPLASIQ 2.5ML UN (Female Continence) ×6 IMPLANT
KIT RM TURNOVER CYSTO AR (KITS) ×3 IMPLANT
MACROPLASTIQUE 2.5ML UNIT (Female Continence) ×18 IMPLANT
NDL ENDOSCOPIC URO 20G (NEEDLE) ×3 IMPLANT
OINTMENT BETADINE 1.5GM (MISCELLANEOUS) ×3 IMPLANT
PACK CYSTO AR (MISCELLANEOUS) ×3 IMPLANT
PREP PVP WINGED SPONGE (MISCELLANEOUS) ×3 IMPLANT
SET CYSTO W/LG BORE CLAMP LF (SET/KITS/TRAYS/PACK) ×3 IMPLANT
SURGILUBE 2OZ TUBE FLIPTOP (MISCELLANEOUS) ×3 IMPLANT
WATER STERILE IRR 1000ML POUR (IV SOLUTION) ×3 IMPLANT
WATER STERILE IRR 3000ML UROMA (IV SOLUTION) ×3 IMPLANT

## 2015-09-15 NOTE — Anesthesia Postprocedure Evaluation (Signed)
Anesthesia Post Note  Patient: Elijah Taylor  Procedure(s) Performed: Procedure(s) (LRB): CYSTOSCOPY MACROPLASTIQUE IMPLANT (N/A)  Patient location during evaluation: PACU Anesthesia Type: General Level of consciousness: awake and alert Pain management: pain level controlled Vital Signs Assessment: post-procedure vital signs reviewed and stable Respiratory status: spontaneous breathing, nonlabored ventilation, respiratory function stable and patient connected to nasal cannula oxygen Cardiovascular status: blood pressure returned to baseline and stable Postop Assessment: no signs of nausea or vomiting Anesthetic complications: no    Last Vitals:  Filed Vitals:   09/15/15 1915 09/15/15 1919  BP: 143/96 141/93  Pulse: 105 100  Temp:  37.1 C  Resp: 19 20    Last Pain:  Filed Vitals:   09/15/15 1922  PainSc: 2                  Precious Haws Lillyona Polasek

## 2015-09-15 NOTE — Anesthesia Preprocedure Evaluation (Signed)
Anesthesia Evaluation  Patient identified by MRN, date of birth, ID band Patient awake    Reviewed: Allergy & Precautions, H&P , NPO status , Patient's Chart, lab work & pertinent test results  History of Anesthesia Complications Negative for: history of anesthetic complications  Airway Mallampati: I  TM Distance: >3 FB Neck ROM: limited    Dental  (+) Poor Dentition, Caps   Pulmonary neg pulmonary ROS, neg shortness of breath,    Pulmonary exam normal breath sounds clear to auscultation       Cardiovascular Exercise Tolerance: Good (-) angina(-) Past MI and (-) DOE negative cardio ROS Normal cardiovascular exam Rhythm:regular Rate:Normal     Neuro/Psych negative neurological ROS  negative psych ROS   GI/Hepatic Neg liver ROS, GERD  Controlled,  Endo/Other  negative endocrine ROS  Renal/GU Renal disease  negative genitourinary   Musculoskeletal  (+) Arthritis ,   Abdominal   Peds  Hematology negative hematology ROS (+)   Anesthesia Other Findings Past Medical History:   Wears glasses                                                Arthritis                                                    Cancer ([place])                                                   Comment:prostate   Insomnia                                                     Urinary bladder incontinence                                   Comment:Related to Prostectomy   Chronic kidney disease                                         Comment:H/O KIDNEY STONES   GERD (gastroesophageal reflux disease)                      Past Surgical History:   TRIGGER FINGER RELEASE                                          Comment:x 4   SHOULDER ARTHROSCOPY W/ ROTATOR CUFF REPAIR                     Comment:right and left   CERVICAL FUSION  2007         CARPAL TUNNEL RELEASE                                           Comment:left  COLONOSCOPY                                                   RETROPUBIC PROSTATECTOMY                         2010         TRIGGER FINGER RELEASE                          Left 09/02/2014      Comment:Procedure: RELEASE A-1 PULLEY LEFT INDEX               FINGER;  Surgeon: Daryll Brod, MD;  Location:               [place];  Service:               Orthopedics;  Laterality: Left;  ANESTHESIA: IV              REGIONAL FAB   CARPAL TUNNEL RELEASE                           Right 12/01/2014      Comment:Procedure: RIGHT CARPAL TUNNEL RELEASE;                Surgeon: Daryll Brod, MD;  Location: [place];  Service: Orthopedics;                Laterality: Right;  BMI    Body Mass Index   30.40 kg/m 2    Signs and symptoms suggestive of sleep apnea    Reproductive/Obstetrics negative OB ROS                             Anesthesia Physical Anesthesia Plan  ASA: III  Anesthesia Plan: General LMA   Post-op Pain Management:    Induction:   Airway Management Planned:   Additional Equipment:   Intra-op Plan:   Post-operative Plan:   Informed Consent: I have reviewed the patients History and Physical, chart, labs and discussed the procedure including the risks, benefits and alternatives for the proposed anesthesia with the patient or authorized representative who has indicated his/her understanding and acceptance.   Dental Advisory Given  Plan Discussed with: Anesthesiologist, CRNA and Surgeon  Anesthesia Plan Comments:         Anesthesia Quick Evaluation

## 2015-09-15 NOTE — Op Note (Signed)
Preoperative diagnosis: Urinary incontinence Postoperative diagnosis: Same  Procedure: Cystoscopy with Macroplastique injection   Surgeon: Otelia Limes. Yves Dill MD, FACS Anesthesia: Gen.  Indications:See the history and physical. After informed consent the above procedure(s) were requested     Technique and findings: After adequate general anesthesia had been obtained the patient was placed into dorsal lithotomy position and the perineum was prepped and draped in the usual fashion. The 79 French cystoscope was coupled the camera and visually advanced into the bladder. Bladder was thoroughly inspected. Both ureteral orifices were identified and had clear reflux. No bladder tumors were identified. Prostate gland was absent. Bladder neck was widely patent. the Macroplastique injection needle was introduced through the scope and initial puncture performed at the 6:00 position. One vial of Macroplastique was injected in this location and elevation of the mucosa was identified. The next injection was placed at the 3:00 position and one vial of Macroplastique injected here with elevation of the mucosa. Next injection was placed at the 9:00 position and one vial injected here. Next puncture and an injection was placed at 10:00 and elevation was noted. The final injection was placed at the 11:00 position and at this point elevation mucosa resulted in complete coaptation of the mucosa circumferentially. The scope was then removed and the bladder was  drained with a 12 French red Robinson catheter. 10 cc of viscous Xylocaine was instilled within the urethra. A B&O suppository was placed. Seizures and terminated and patient transferred to the recovery room stable condition.

## 2015-09-15 NOTE — H&P (Signed)
Date of Initial H&P: 09/10/15  History reviewed, patient examined, no change in status, stable for surgery.

## 2015-09-15 NOTE — Transfer of Care (Signed)
Immediate Anesthesia Transfer of Care Note  Patient: Elijah Taylor  Procedure(s) Performed: Procedure(s): CYSTOSCOPY MACROPLASTIQUE IMPLANT (N/A)  Patient Location: PACU  Anesthesia Type:General  Level of Consciousness: sedated and patient cooperative  Airway & Oxygen Therapy: Patient Spontanous Breathing and Patient connected to face mask oxygen  Post-op Assessment: Report given to RN and Post -op Vital signs reviewed and stable  Post vital signs: Reviewed and stable  Last Vitals:  Filed Vitals:   09/15/15 1646  BP: 148/97  Pulse: 116  Temp: 37.3 C  Resp: 16    Complications: No apparent anesthesia complications

## 2015-09-15 NOTE — Discharge Instructions (Addendum)
Collagen Implant for Urinary Incontinence, Care After Read the instructions outlined below and refer to this sheet in the next few weeks. These discharge instructions provide you with general information on caring for yourself after you leave the hospital. Your caregiver may also give you specific instructions. While your treatment has been planned according to the most current medical practices available, unavoidable complications occasionally occur. If you have any problems or questions after discharge, call your caregiver. ACTIVITY  You may resume your regular activity.  Talk with your caregiver about when you may return to work.  You may shower. NUTRITION  You may resume your normal diet.  Drink plenty of fluids (6-8 glasses a day).  Eat a well-balanced diet.  Call your doctor if you have persistent nausea or vomiting. FEVER  If you feel feverish or have shaking chills, take your temperature. If it is 31 F (38.9 C), call your doctor.  Fever may mean there is an infection. PAIN CONTROL  Only take over-the-counter or prescription medicines for pain, discomfort, or fever as directed by your caregiver. SYMPTOMS AFTER SURGERY You may experience one or more of the following symptoms after your implant procedure. These symptoms should be only temporary:  Inability to urinate or a change in your usual pattern of urination.  Slight bleeding at the urethra or blood in the urine.  Soreness in the injection site area.  Nausea. SEXUAL INTERCOURSE Check with your doctor at your follow-up visit about resuming sexual intercourse. SEEK IMMEDIATE MEDICAL CARE IF:   Cloudy foul-smelling urine.  Fever greater than 102 F (38.9 C).  Joint pain or joint swelling.  Skin rash or skin thickening.  Muscle aches.  Weakness or fatigue.  You are unable to urinate.   This information is not intended to replace advice given to you by your health care provider. Make sure you discuss any  questions you have with your health care provider.   Document Released: 03/08/2004 Document Revised: 05/15/2013 Document Reviewed: 06/25/2014 Elsevier Interactive Patient Education 2016 [place] Anesthesia, Adult General anesthesia is a sleep-like state of non-feeling produced by medicines (anesthetics). General anesthesia prevents you from being alert and feeling pain during a medical procedure. Your caregiver may recommend general anesthesia if your procedure:  Is long.  Is painful or uncomfortable.  Would be frightening to see or hear.  Requires you to be still.  Affects your breathing.  Causes significant blood loss. LET YOUR CAREGIVER KNOW ABOUT:  Allergies to food or medicine.  Medicines taken, including vitamins, herbs, eyedrops, over-the-counter medicines, and creams.  Use of steroids (by mouth or creams).  Previous problems with anesthetics or numbing medicines, including problems experienced by relatives.  History of bleeding problems or blood clots.  Previous surgeries and types of anesthetics received.  Possibility of pregnancy, if this applies.  Use of cigarettes, alcohol, or illegal drugs.  Any health condition(s), especially diabetes, sleep apnea, and high blood pressure. RISKS AND COMPLICATIONS General anesthesia rarely causes complications. However, if complications do occur, they can be life threatening. Complications include:  A lung infection.  A stroke.  A heart attack.  Waking up during the procedure. When this occurs, the patient may be unable to move and communicate that he or she is awake. The patient may feel severe pain. Older adults and adults with serious medical problems are more likely to have complications than adults who are young and healthy. Some complications can be prevented by answering all of your caregiver's questions thoroughly and by following  all pre-procedure instructions. It is important to tell your caregiver  if any of the pre-procedure instructions, especially those related to diet, were not followed. Any food or liquid in the stomach can cause problems when you are under general anesthesia. BEFORE THE PROCEDURE  Ask your caregiver if you will have to spend the night at the hospital. If you will not have to spend the night, arrange to have an adult drive you and stay with you for 24 hours.  Follow your caregiver's instructions if you are taking dietary supplements or medicines. Your caregiver may tell you to stop taking them or to reduce your dosage.  Do not smoke for as long as possible before your procedure. If possible, stop smoking 3-6 weeks before the procedure.  Do not take new dietary supplements or medicines within 1 week of your procedure unless your caregiver approves them.  Do not eat within 8 hours of your procedure or as directed by your caregiver. Drink only clear liquids, such as water, black coffee (without milk or cream), and fruit juices (without pulp).  Do not drink within 3 hours of your procedure or as directed by your caregiver.  You may brush your teeth on the morning of the procedure, but make sure to spit out the toothpaste and water when finished. PROCEDURE  You will receive anesthetics through a mask, through an intravenous (IV) access tube, or through both. A doctor who specializes in anesthesia (anesthesiologist) or a nurse who specializes in anesthesia (nurse anesthetist) or both will stay with you throughout the procedure to make sure you remain unconscious. He or she will also watch your blood pressure, pulse, and oxygen levels to make sure that the anesthetics do not cause any problems. Once you are asleep, a breathing tube or mask may be used to help you breathe. AFTER THE PROCEDURE You will wake up after the procedure is complete. You may be in the room where the procedure was performed or in a recovery area. You may have a sore throat if a breathing tube was used.  You may also feel:  Dizzy.  Weak.  Drowsy.  Confused.  Nauseous.  Cold. These are all normal responses and can be expected to last for up to 24 hours after the procedure is complete. A caregiver will tell you when you are ready to go home. This will usually be when you are fully awake and in stable condition.   This information is not intended to replace advice given to you by your health care provider. Make sure you discuss any questions you have with your health care provider.   Document Released: 11/01/2007 Document Revised: 08/15/2014 Document Reviewed: 11/23/2011 Elsevier Interactive Patient Education [employer].

## 2015-09-15 NOTE — Anesthesia Procedure Notes (Signed)
Procedure Name: LMA Insertion Date/Time: 09/15/2015 5:46 PM Performed by: Dionne Bucy Pre-anesthesia Checklist: Patient identified, Patient being monitored, Timeout performed, Emergency Drugs available and Suction available Patient Re-evaluated:Patient Re-evaluated prior to inductionOxygen Delivery Method: Circle system utilized Preoxygenation: Pre-oxygenation with 100% oxygen Intubation Type: IV induction Ventilation: Mask ventilation without difficulty LMA: LMA inserted LMA Size: 4.5 Tube type: Oral Number of attempts: 1 Placement Confirmation: positive ETCO2 and breath sounds checked- equal and bilateral Tube secured with: Tape Dental Injury: Teeth and Oropharynx as per pre-operative assessment

## 2015-09-16 ENCOUNTER — Encounter: Payer: Self-pay | Admitting: Urology

## 2015-09-16 DIAGNOSIS — N393 Stress incontinence (female) (male): Secondary | ICD-10-CM | POA: Diagnosis not present

## 2015-09-18 DIAGNOSIS — N393 Stress incontinence (female) (male): Secondary | ICD-10-CM | POA: Diagnosis not present

## 2015-09-18 DIAGNOSIS — D400 Neoplasm of uncertain behavior of prostate: Secondary | ICD-10-CM | POA: Diagnosis not present

## 2015-11-11 DIAGNOSIS — L820 Inflamed seborrheic keratosis: Secondary | ICD-10-CM | POA: Diagnosis not present

## 2015-11-11 DIAGNOSIS — L812 Freckles: Secondary | ICD-10-CM | POA: Diagnosis not present

## 2015-11-11 DIAGNOSIS — L821 Other seborrheic keratosis: Secondary | ICD-10-CM | POA: Diagnosis not present

## 2015-11-11 DIAGNOSIS — L578 Other skin changes due to chronic exposure to nonionizing radiation: Secondary | ICD-10-CM | POA: Diagnosis not present

## 2016-02-08 ENCOUNTER — Other Ambulatory Visit: Payer: Self-pay | Admitting: Family Medicine

## 2016-03-14 DIAGNOSIS — R350 Frequency of micturition: Secondary | ICD-10-CM | POA: Diagnosis not present

## 2016-03-14 DIAGNOSIS — D400 Neoplasm of uncertain behavior of prostate: Secondary | ICD-10-CM | POA: Diagnosis not present

## 2016-03-14 DIAGNOSIS — C61 Malignant neoplasm of prostate: Secondary | ICD-10-CM | POA: Diagnosis not present

## 2016-03-14 DIAGNOSIS — R3915 Urgency of urination: Secondary | ICD-10-CM | POA: Diagnosis not present

## 2016-04-01 ENCOUNTER — Other Ambulatory Visit: Payer: Self-pay

## 2016-09-20 DIAGNOSIS — D400 Neoplasm of uncertain behavior of prostate: Secondary | ICD-10-CM | POA: Diagnosis not present

## 2016-09-20 DIAGNOSIS — R350 Frequency of micturition: Secondary | ICD-10-CM | POA: Diagnosis not present

## 2016-09-20 DIAGNOSIS — C61 Malignant neoplasm of prostate: Secondary | ICD-10-CM | POA: Diagnosis not present

## 2016-09-20 DIAGNOSIS — N393 Stress incontinence (female) (male): Secondary | ICD-10-CM | POA: Diagnosis not present

## 2016-09-20 DIAGNOSIS — R3915 Urgency of urination: Secondary | ICD-10-CM | POA: Diagnosis not present

## 2016-09-20 DIAGNOSIS — R351 Nocturia: Secondary | ICD-10-CM | POA: Diagnosis not present

## 2016-10-06 ENCOUNTER — Ambulatory Visit (INDEPENDENT_AMBULATORY_CARE_PROVIDER_SITE_OTHER): Payer: Medicare Other | Admitting: Family Medicine

## 2016-10-06 ENCOUNTER — Encounter: Payer: Self-pay | Admitting: Family Medicine

## 2016-10-06 VITALS — BP 116/71 | HR 89 | Ht 69.29 in | Wt 184.0 lb

## 2016-10-06 DIAGNOSIS — Z13220 Encounter for screening for lipoid disorders: Secondary | ICD-10-CM

## 2016-10-06 DIAGNOSIS — Z0000 Encounter for general adult medical examination without abnormal findings: Secondary | ICD-10-CM | POA: Diagnosis not present

## 2016-10-06 DIAGNOSIS — R5383 Other fatigue: Secondary | ICD-10-CM | POA: Diagnosis not present

## 2016-10-06 DIAGNOSIS — Z1159 Encounter for screening for other viral diseases: Secondary | ICD-10-CM

## 2016-10-06 DIAGNOSIS — E7800 Pure hypercholesterolemia, unspecified: Secondary | ICD-10-CM

## 2016-10-06 DIAGNOSIS — C61 Malignant neoplasm of prostate: Secondary | ICD-10-CM | POA: Diagnosis not present

## 2016-10-06 DIAGNOSIS — Z1329 Encounter for screening for other suspected endocrine disorder: Secondary | ICD-10-CM | POA: Diagnosis not present

## 2016-10-06 LAB — URINALYSIS, ROUTINE W REFLEX MICROSCOPIC
BILIRUBIN UA: NEGATIVE
Glucose, UA: NEGATIVE
Ketones, UA: NEGATIVE
Leukocytes, UA: NEGATIVE
Nitrite, UA: NEGATIVE
PH UA: 7.5 (ref 5.0–7.5)
PROTEIN UA: NEGATIVE
RBC, UA: NEGATIVE
Specific Gravity, UA: 1.015 (ref 1.005–1.030)
Urobilinogen, Ur: 1 mg/dL (ref 0.2–1.0)

## 2016-10-06 NOTE — Assessment & Plan Note (Signed)
Discuss fatigue which is only occasionally most likely lifestyle will observe for now.

## 2016-10-06 NOTE — Assessment & Plan Note (Signed)
Followed by urology.   

## 2016-10-06 NOTE — Progress Notes (Signed)
BP 116/71   Pulse 89   Ht 5' 9.29" (1.76 m)   Wt 184 lb (83.5 kg)   SpO2 97%   BMI 26.94 kg/m    Subjective:    Patient ID: Elijah Taylor, male    DOB: [birth year], 66 y.o.   MRN: [id number]  HPI: MARDOCHEE REICHLING is a 66 y.o. male  Chief Complaint  Patient presents with  . Annual Exam  All in all patient doing well no real complaints maybe has some hemorrhoid symptoms. Small spot on his right medial calf area which is unchanging not inflamed. Otherwise doing well Takes Halcion for sleep has been prescribed by urology. Was seen patient every 6 months and now is spreading it out to a year. Relevant past medical, surgical, family and social history reviewed and updated as indicated. Interim medical history since our last visit reviewed. Allergies and medications reviewed and updated.  Review of Systems  Constitutional: Negative.   HENT: Negative.   Eyes: Negative.   Respiratory: Negative.   Cardiovascular: Negative.   Gastrointestinal: Negative.   Endocrine: Negative.   Genitourinary: Negative.   Musculoskeletal: Negative.   Skin: Negative.   Allergic/Immunologic: Negative.   Neurological: Negative.   Hematological: Negative.   Psychiatric/Behavioral: Negative.     Per HPI unless specifically indicated above     Objective:    BP 116/71   Pulse 89   Ht 5' 9.29" (1.76 m)   Wt 184 lb (83.5 kg)   SpO2 97%   BMI 26.94 kg/m   Wt Readings from Last 3 Encounters:  10/06/16 184 lb (83.5 kg)  09/15/15 206 lb (93.4 kg)  04/08/15 206 lb (93.4 kg)    Physical Exam  Constitutional: He is oriented to person, place, and time. He appears well-developed and well-nourished.  HENT:  Head: Normocephalic.  Right Ear: External ear normal.  Left Ear: External ear normal.  Nose: Nose normal.  Eyes: Conjunctivae and EOM are normal. Pupils are equal, round, and reactive to light.  Neck: Normal range of motion. Neck supple. No thyromegaly present.  Cardiovascular: Normal  rate, regular rhythm, normal heart sounds and intact distal pulses.   Pulmonary/Chest: Effort normal and breath sounds normal.  Abdominal: Soft. Bowel sounds are normal. There is no splenomegaly or hepatomegaly.  Genitourinary:  Genitourinary Comments: Small hemarrhoid  Musculoskeletal: Normal range of motion.  Lymphadenopathy:    He has no cervical adenopathy.  Neurological: He is alert and oriented to person, place, and time. He has normal reflexes.  Skin: Skin is warm and dry.  Psychiatric: He has a normal mood and affect. His behavior is normal. Judgment and thought content normal.    Results for orders placed or performed during the hospital encounter of 12/01/14  Hemoglobin-hemacue, POC  Result Value Ref Range   Hemoglobin 17.4 (H) 13.0 - 17.0 g/dL      Assessment & Plan:   Problem List Items Addressed This Visit      Genitourinary   Prostate cancer ([place])    Followed by urology      Relevant Orders   Comprehensive metabolic panel   Lipid panel   CBC with Differential/Platelet   TSH   Urinalysis, Routine w reflex microscopic     Other   Fatigue    Discuss fatigue which is only occasionally most likely lifestyle will observe for now.      Relevant Orders   TSH   Hypercholesteremia   Relevant Orders   Lipid panel  Other Visit Diagnoses    Annual physical exam    -  Primary   Relevant Orders   Comprehensive metabolic panel   Lipid panel   CBC with Differential/Platelet   TSH   Urinalysis, Routine w reflex microscopic   Hepatitis C Antibody   Screening cholesterol level       Relevant Orders   Lipid panel   Thyroid disorder screen       Relevant Orders   TSH   Need for hepatitis C screening test       Relevant Orders   Hepatitis C Antibody       Follow up plan: Return in about 1 year (around 10/06/2017) for Physical Exam.

## 2016-10-07 ENCOUNTER — Encounter: Payer: Self-pay | Admitting: Family Medicine

## 2016-10-07 LAB — HEPATITIS C ANTIBODY: Hep C Virus Ab: <0.1 s/co ratio (ref 0.0–0.9)

## 2016-10-07 LAB — COMPREHENSIVE METABOLIC PANEL
ALK PHOS: 74 IU/L (ref 39–117)
ALT: 16 IU/L (ref 0–44)
AST: 19 IU/L (ref 0–40)
Albumin/Globulin Ratio: 1.8 (ref 1.2–2.2)
Albumin: 4.3 g/dL (ref 3.6–4.8)
BUN/Creatinine Ratio: 14 (ref 10–24)
BUN: 16 mg/dL (ref 8–27)
Bilirubin Total: 0.6 mg/dL (ref 0.0–1.2)
CO2: 26 mmol/L (ref 18–29)
CREATININE: 1.14 mg/dL (ref 0.76–1.27)
Calcium: 9.3 mg/dL (ref 8.6–10.2)
Chloride: 103 mmol/L (ref 96–106)
GFR calc Af Amer: 77 mL/min/{1.73_m2} (ref >59)
GFR calc non Af Amer: 66 mL/min/{1.73_m2} (ref >59)
GLOBULIN, TOTAL: 2.4 g/dL (ref 1.5–4.5)
GLUCOSE: 92 mg/dL (ref 65–99)
Potassium: 4.5 mmol/L (ref 3.5–5.2)
SODIUM: 142 mmol/L (ref 134–144)
Total Protein: 6.7 g/dL (ref 6.0–8.5)

## 2016-10-07 LAB — CBC WITH DIFFERENTIAL/PLATELET
Basophils Absolute: 0.1 10*3/uL (ref 0.0–0.2)
Basos: 1 %
EOS (ABSOLUTE): 0.3 10*3/uL (ref 0.0–0.4)
Eos: 4 %
Hematocrit: 45.1 % (ref 37.5–51.0)
Hemoglobin: 15.7 g/dL (ref 13.0–17.7)
IMMATURE GRANULOCYTES: 0 %
Immature Grans (Abs): 0 10*3/uL (ref 0.0–0.1)
Lymphocytes Absolute: 2.1 10*3/uL (ref 0.7–3.1)
Lymphs: 29 %
MCH: 31.2 pg (ref 26.6–33.0)
MCHC: 34.8 g/dL (ref 31.5–35.7)
MCV: 90 fL (ref 79–97)
MONOS ABS: 0.5 10*3/uL (ref 0.1–0.9)
Monocytes: 7 %
Neutrophils Absolute: 4.3 10*3/uL (ref 1.4–7.0)
Neutrophils: 59 %
PLATELETS: 317 10*3/uL (ref 150–379)
RBC: 5.03 x10E6/uL (ref 4.14–5.80)
RDW: 13.5 % (ref 12.3–15.4)
WBC: 7.3 10*3/uL (ref 3.4–10.8)

## 2016-10-07 LAB — LIPID PANEL
CHOL/HDL RATIO: 5 ratio (ref 0.0–5.0)
Cholesterol, Total: 174 mg/dL (ref 100–199)
HDL: 35 mg/dL — ABNORMAL LOW (ref >39)
LDL CALC: 111 mg/dL — AB (ref 0–99)
Triglycerides: 140 mg/dL (ref 0–149)
VLDL CHOLESTEROL CAL: 28 mg/dL (ref 5–40)

## 2016-10-07 LAB — TSH: TSH: 0.926 u[IU]/mL (ref 0.450–4.500)

## 2017-03-01 ENCOUNTER — Encounter: Payer: Self-pay | Admitting: Family Medicine

## 2017-03-01 ENCOUNTER — Ambulatory Visit (INDEPENDENT_AMBULATORY_CARE_PROVIDER_SITE_OTHER): Payer: Medicare Other | Admitting: Family Medicine

## 2017-03-01 DIAGNOSIS — L570 Actinic keratosis: Secondary | ICD-10-CM

## 2017-03-01 DIAGNOSIS — L578 Other skin changes due to chronic exposure to nonionizing radiation: Secondary | ICD-10-CM | POA: Insufficient documentation

## 2017-03-01 MED ORDER — BETAMETHASONE DIPROPIONATE 0.05 % EX CREA: TOPICAL_CREAM | Freq: Two times a day (BID) | CUTANEOUS | 0 refills | Status: DC

## 2017-03-01 NOTE — Assessment & Plan Note (Signed)
For possible solar dermatitis discuss wearing long sleeves avoiding exposure betamethasone cream if problems persist further evaluate

## 2017-03-01 NOTE — Assessment & Plan Note (Signed)
Multiple areas of actinic keratosis will refer to dermatology for skin check

## 2017-03-01 NOTE — Progress Notes (Signed)
BP 124/76   Pulse 90   Wt 182 lb 12.8 oz (82.9 kg)   SpO2 97%   BMI 26.77 kg/m    Subjective:    Patient ID: Elijah Taylor, male    DOB: [birth year], 65 y.o.   MRN: [id number]  HPI: Elijah Taylor is a 66 y.o. male  Chief Complaint  Patient presents with  . Rash    Itchy, slight burn, Both arms. Noticed a week ago.    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 Imaging rash present worse on right arm and left arm for both arms intense itching no other lesions on legs or face. Has not tried other medications. No other known contacts or irritants Rashes red and exposed on both arms (right worse than left Fatigue has largely resolved on review.  Relevant past medical, surgical, family and social history reviewed and updated as indicated. Interim medical history since our last visit reviewed. Allergies and medications reviewed and updated.  Review of Systems  Constitutional: Negative.   Respiratory: Negative.   Cardiovascular: Negative.     Per HPI unless specifically indicated above     Objective:    BP 124/76   Pulse 90   Wt 182 lb 12.8 oz (82.9 kg)   SpO2 97%   BMI 26.77 kg/m   Wt Readings from Last 3 Encounters:  03/01/17 182 lb 12.8 oz (82.9 kg)  10/06/16 184 lb (83.5 kg)  09/15/15 206 lb (93.4 kg)    Physical Exam  Constitutional: He is oriented to person, place, and time. He appears well-developed and well-nourished.  HENT:  Head: Normocephalic and atraumatic.  Eyes: Conjunctivae and EOM are normal.  Neck: Normal range of motion.  Cardiovascular: Normal rate, regular rhythm and normal heart sounds.   Pulmonary/Chest: Effort normal and breath sounds normal.  Musculoskeletal: Normal range of motion.  Neurological: He is alert and oriented to person, place, and time.  Skin: No erythema.  Inflamed patch posterior of both forearms right worse than left  Multiple actinniic keratosis of face scalp  and arms  Psychiatric: He has a normal mood and affect. His behavior is normal. Judgment and thought content normal.    Results for orders placed or performed in visit on 10/06/16  Comprehensive metabolic panel  Result Value Ref Range   Glucose 92 65 - 99 mg/dL   BUN 16 8 - 27 mg/dL   Creatinine, Ser 1.14 0.76 - 1.27 mg/dL   GFR calc non Af Amer 66 >59 mL/min/1.73   GFR calc Af Amer 77 >59 mL/min/1.73   BUN/Creatinine Ratio 14 10 - 24   Sodium 142 134 - 144 mmol/L   Potassium 4.5 3.5 - 5.2 mmol/L   Chloride 103 96 - 106 mmol/L   CO2 26 18 - 29 mmol/L   Calcium 9.3 8.6 - 10.2 mg/dL   Total Protein 6.7 6.0 - 8.5 g/dL   Albumin 4.3 3.6 - 4.8 g/dL   Globulin, Total 2.4 1.5 - 4.5 g/dL   Albumin/Globulin Ratio 1.8 1.2 - 2.2   Bilirubin Total 0.6 0.0 - 1.2 mg/dL   Alkaline Phosphatase 74 39 - 117 IU/L   AST 19 0 - 40 IU/L   ALT 16 0 - 44 IU/L  Lipid panel  Result Value Ref Range   Cholesterol, Total 174 100 - 199 mg/dL   Triglycerides 140 0 - 149 mg/dL   HDL 35 (L) >39 mg/dL  VLDL Cholesterol Cal 28 5 - 40 mg/dL   LDL Calculated 111 (H) 0 - 99 mg/dL   Chol/HDL Ratio 5.0 0.0 - 5.0 ratio units  CBC with Differential/Platelet  Result Value Ref Range   WBC 7.3 3.4 - 10.8 x10E3/uL   RBC 5.03 4.14 - 5.80 x10E6/uL   Hemoglobin 15.7 13.0 - 17.7 g/dL   Hematocrit 45.1 37.5 - 51.0 %   MCV 90 79 - 97 fL   MCH 31.2 26.6 - 33.0 pg   MCHC 34.8 31.5 - 35.7 g/dL   RDW 13.5 12.3 - 15.4 %   Platelets 317 150 - 379 x10E3/uL   Neutrophils 59 Not Estab. %   Lymphs 29 Not Estab. %   Monocytes 7 Not Estab. %   Eos 4 Not Estab. %   Basos 1 Not Estab. %   Neutrophils Absolute 4.3 1.4 - 7.0 x10E3/uL   Lymphocytes Absolute 2.1 0.7 - 3.1 x10E3/uL   Monocytes Absolute 0.5 0.1 - 0.9 x10E3/uL   EOS (ABSOLUTE) 0.3 0.0 - 0.4 x10E3/uL   Basophils Absolute 0.1 0.0 - 0.2 x10E3/uL   Immature Granulocytes 0 Not Estab. %   Immature Grans (Abs) 0.0 0.0 - 0.1 x10E3/uL  TSH  Result Value Ref Range   TSH  0.926 0.450 - 4.500 uIU/mL  Urinalysis, Routine w reflex microscopic  Result Value Ref Range   Specific Gravity, UA 1.015 1.005 - 1.030   pH, UA 7.5 5.0 - 7.5   Color, UA Yellow Yellow   Appearance Ur Clear Clear   Leukocytes, UA Negative Negative   Protein, UA Negative Negative/Trace   Glucose, UA Negative Negative   Ketones, UA Negative Negative   RBC, UA Negative Negative   Bilirubin, UA Negative Negative   Urobilinogen, Ur 1.0 0.2 - 1.0 mg/dL   Nitrite, UA Negative Negative  Hepatitis C Antibody  Result Value Ref Range   Hep C Virus Ab <0.1 0.0 - 0.9 s/co ratio      Assessment & Plan:   Problem List Items Addressed This Visit      Musculoskeletal and Integument   Actinic keratosis    Multiple areas of actinic keratosis will refer to dermatology for skin check      Relevant Orders   Ambulatory referral to Dermatology   Solar dermatitis    For possible solar dermatitis discuss wearing long sleeves avoiding exposure betamethasone cream if problems persist further evaluate      Relevant Medications   betamethasone dipropionate (DIPROLENE) 0.05 % cream   Other Relevant Orders   Ambulatory referral to Dermatology       Follow up plan: Return for As scheduled.

## 2017-03-08 ENCOUNTER — Telehealth: Payer: Self-pay | Admitting: Family Medicine

## 2017-03-08 MED ORDER — PREDNISONE 10 MG PO TABS: ORAL_TABLET | ORAL | 0 refills | Status: DC

## 2017-03-08 NOTE — Telephone Encounter (Addendum)
Pt rash is spreading. Please advise.   Phone call Discussed with patient now is on legs and arms both dimpling helps some but not enough will call in prednisone if not better may consider dermatology referral.

## 2017-03-16 ENCOUNTER — Other Ambulatory Visit: Payer: Self-pay | Admitting: Family Medicine

## 2017-03-16 DIAGNOSIS — L578 Other skin changes due to chronic exposure to nonionizing radiation: Secondary | ICD-10-CM

## 2017-03-16 MED ORDER — BETAMETHASONE DIPROPIONATE 0.05 % EX CREA: TOPICAL_CREAM | Freq: Two times a day (BID) | CUTANEOUS | 0 refills | Status: DC

## 2017-03-16 NOTE — Telephone Encounter (Signed)
Patient is out of the cream for his rash. Would like to know if someone could call him in another script for him. She is aware Dr Jeananne Rama is out but is hoping someone else would be able to do this. The cream is betamethasone.  [facility]  Thank you  [phone]

## 2017-04-05 DIAGNOSIS — L812 Freckles: Secondary | ICD-10-CM | POA: Diagnosis not present

## 2017-04-05 DIAGNOSIS — L820 Inflamed seborrheic keratosis: Secondary | ICD-10-CM | POA: Diagnosis not present

## 2017-04-05 DIAGNOSIS — L821 Other seborrheic keratosis: Secondary | ICD-10-CM | POA: Diagnosis not present

## 2017-04-05 DIAGNOSIS — R21 Rash and other nonspecific skin eruption: Secondary | ICD-10-CM | POA: Diagnosis not present

## 2017-04-05 DIAGNOSIS — L578 Other skin changes due to chronic exposure to nonionizing radiation: Secondary | ICD-10-CM | POA: Diagnosis not present

## 2017-05-10 DIAGNOSIS — M65331 Trigger finger, right middle finger: Secondary | ICD-10-CM | POA: Diagnosis not present

## 2017-05-11 ENCOUNTER — Encounter (HOSPITAL_BASED_OUTPATIENT_CLINIC_OR_DEPARTMENT_OTHER): Payer: Self-pay | Admitting: *Deleted

## 2017-05-12 ENCOUNTER — Other Ambulatory Visit: Payer: Self-pay | Admitting: Orthopedic Surgery

## 2017-05-16 ENCOUNTER — Ambulatory Visit (HOSPITAL_BASED_OUTPATIENT_CLINIC_OR_DEPARTMENT_OTHER)
Admission: RE | Admit: 2017-05-16 | Discharge: 2017-05-16 | Disposition: A | Discharge status: Home / Self Care | Payer: Medicare Other | Source: Ambulatory Visit | Attending: Orthopedic Surgery | Admitting: Orthopedic Surgery

## 2017-05-16 ENCOUNTER — Ambulatory Visit (HOSPITAL_BASED_OUTPATIENT_CLINIC_OR_DEPARTMENT_OTHER): Payer: Medicare Other | Admitting: Certified Registered"

## 2017-05-16 ENCOUNTER — Encounter (HOSPITAL_BASED_OUTPATIENT_CLINIC_OR_DEPARTMENT_OTHER): Payer: Self-pay | Admitting: Emergency Medicine

## 2017-05-16 ENCOUNTER — Encounter (HOSPITAL_BASED_OUTPATIENT_CLINIC_OR_DEPARTMENT_OTHER)
Admission: RE | Disposition: A | Discharge status: Home / Self Care | Payer: Self-pay | Source: Ambulatory Visit | Attending: Orthopedic Surgery

## 2017-05-16 DIAGNOSIS — Z87442 Personal history of urinary calculi: Secondary | ICD-10-CM | POA: Diagnosis not present

## 2017-05-16 DIAGNOSIS — Z8249 Family history of ischemic heart disease and other diseases of the circulatory system: Secondary | ICD-10-CM | POA: Diagnosis not present

## 2017-05-16 DIAGNOSIS — C61 Malignant neoplasm of prostate: Secondary | ICD-10-CM | POA: Diagnosis not present

## 2017-05-16 DIAGNOSIS — I252 Old myocardial infarction: Secondary | ICD-10-CM | POA: Insufficient documentation

## 2017-05-16 DIAGNOSIS — N189 Chronic kidney disease, unspecified: Secondary | ICD-10-CM | POA: Insufficient documentation

## 2017-05-16 DIAGNOSIS — M65341 Trigger finger, right ring finger: Secondary | ICD-10-CM | POA: Diagnosis not present

## 2017-05-16 DIAGNOSIS — M1990 Unspecified osteoarthritis, unspecified site: Secondary | ICD-10-CM | POA: Insufficient documentation

## 2017-05-16 DIAGNOSIS — G4700 Insomnia, unspecified: Secondary | ICD-10-CM | POA: Diagnosis not present

## 2017-05-16 DIAGNOSIS — E7800 Pure hypercholesterolemia, unspecified: Secondary | ICD-10-CM | POA: Diagnosis not present

## 2017-05-16 DIAGNOSIS — Z808 Family history of malignant neoplasm of other organs or systems: Secondary | ICD-10-CM | POA: Insufficient documentation

## 2017-05-16 DIAGNOSIS — M65331 Trigger finger, right middle finger: Secondary | ICD-10-CM | POA: Insufficient documentation

## 2017-05-16 DIAGNOSIS — K219 Gastro-esophageal reflux disease without esophagitis: Secondary | ICD-10-CM | POA: Insufficient documentation

## 2017-05-16 DIAGNOSIS — M65841 Other synovitis and tenosynovitis, right hand: Secondary | ICD-10-CM | POA: Diagnosis not present

## 2017-05-16 HISTORY — PX: TRIGGER FINGER RELEASE: SHX641

## 2017-05-16 SURGERY — RELEASE, A1 PULLEY, FOR TRIGGER FINGER
Anesthesia: Regional | Site: Hand | Laterality: Right

## 2017-05-16 MED ORDER — CHLORHEXIDINE GLUCONATE 4 % EX LIQD: 60.0000 mL | Freq: Once | CUTANEOUS | Status: DC

## 2017-05-16 MED ORDER — TRAMADOL HCL 50 MG PO TABS: 50.0000 mg | ORAL_TABLET | Freq: Four times a day (QID) | ORAL | 0 refills | Status: DC | PRN

## 2017-05-16 MED ORDER — FENTANYL CITRATE (PF) 100 MCG/2ML IJ SOLN
INTRAMUSCULAR | Status: DC | PRN
  Administered 2017-05-16: 25 ug via INTRAVENOUS

## 2017-05-16 MED ORDER — LACTATED RINGERS IV SOLN
INTRAVENOUS | Status: DC
  Administered 2017-05-16: 10:00:00 via INTRAVENOUS

## 2017-05-16 MED ORDER — ATROPINE SULFATE 0.4 MG/ML IJ SOLN
INTRAMUSCULAR | Status: AC
  Filled 2017-05-16: qty 8

## 2017-05-16 MED ORDER — ONDANSETRON HCL 4 MG/2ML IJ SOLN
INTRAMUSCULAR | Status: DC | PRN
  Administered 2017-05-16: 4 mg via INTRAVENOUS

## 2017-05-16 MED ORDER — KETOROLAC TROMETHAMINE 30 MG/ML IJ SOLN
INTRAMUSCULAR | Status: DC | PRN
  Administered 2017-05-16: 30 mg via INTRAVENOUS

## 2017-05-16 MED ORDER — BUPIVACAINE HCL (PF) 0.5 % IJ SOLN
INTRAMUSCULAR | Status: DC | PRN
  Administered 2017-05-16: 5 mL

## 2017-05-16 MED ORDER — MIDAZOLAM HCL 2 MG/2ML IJ SOLN
INTRAMUSCULAR | Status: AC
  Filled 2017-05-16: qty 2

## 2017-05-16 MED ORDER — MEPERIDINE HCL 25 MG/ML IJ SOLN: 6.2500 mg | INTRAMUSCULAR | Status: DC | PRN

## 2017-05-16 MED ORDER — CEFAZOLIN SODIUM-DEXTROSE 2-4 GM/100ML-% IV SOLN
INTRAVENOUS | Status: AC
  Filled 2017-05-16: qty 100

## 2017-05-16 MED ORDER — CEFAZOLIN SODIUM-DEXTROSE 2-4 GM/100ML-% IV SOLN
2.0000 g | INTRAVENOUS | Status: AC
  Administered 2017-05-16: 2 g via INTRAVENOUS

## 2017-05-16 MED ORDER — FENTANYL CITRATE (PF) 100 MCG/2ML IJ SOLN: 25.0000 ug | INTRAMUSCULAR | Status: DC | PRN

## 2017-05-16 MED ORDER — MIDAZOLAM HCL 5 MG/5ML IJ SOLN
INTRAMUSCULAR | Status: DC | PRN
  Administered 2017-05-16: 2 mg via INTRAVENOUS

## 2017-05-16 MED ORDER — METOCLOPRAMIDE HCL 5 MG/ML IJ SOLN: 10.0000 mg | Freq: Once | INTRAMUSCULAR | Status: DC | PRN

## 2017-05-16 MED ORDER — FENTANYL CITRATE (PF) 100 MCG/2ML IJ SOLN
INTRAMUSCULAR | Status: AC
  Filled 2017-05-16: qty 2

## 2017-05-16 SURGICAL SUPPLY — 35 items
BANDAGE COBAN STERILE 2 (GAUZE/BANDAGES/DRESSINGS) ×3 IMPLANT
BLADE SURG 15 STRL LF DISP TIS (BLADE) ×1 IMPLANT
BLADE SURG 15 STRL SS (BLADE) ×3
BNDG CMPR 9X4 STRL LF SNTH (GAUZE/BANDAGES/DRESSINGS)
BNDG ESMARK 4X9 LF (GAUZE/BANDAGES/DRESSINGS) IMPLANT
CHLORAPREP W/TINT 26ML (MISCELLANEOUS) ×3 IMPLANT
CORD BIPOLAR FORCEPS 12FT (ELECTRODE) IMPLANT
COVER BACK TABLE 60X90IN (DRAPES) ×3 IMPLANT
COVER MAYO STAND STRL (DRAPES) ×3 IMPLANT
CUFF TOURNIQUET SINGLE 18IN (TOURNIQUET CUFF) ×2 IMPLANT
DECANTER SPIKE VIAL GLASS SM (MISCELLANEOUS) IMPLANT
DRAPE EXTREMITY T 121X128X90 (DRAPE) ×3 IMPLANT
DRAPE SURG 17X23 STRL (DRAPES) ×3 IMPLANT
GAUZE SPONGE 4X4 12PLY STRL (GAUZE/BANDAGES/DRESSINGS) ×3 IMPLANT
GAUZE XEROFORM 1X8 LF (GAUZE/BANDAGES/DRESSINGS) ×3 IMPLANT
GLOVE BIO SURGEON STRL SZ 6.5 (GLOVE) ×2 IMPLANT
GLOVE BIO SURGEONS STRL SZ 6.5 (GLOVE) ×2
GLOVE BIOGEL PI IND STRL 6.5 (GLOVE) IMPLANT
GLOVE BIOGEL PI IND STRL 8.5 (GLOVE) ×1 IMPLANT
GLOVE BIOGEL PI INDICATOR 6.5 (GLOVE) ×2
GLOVE BIOGEL PI INDICATOR 8.5 (GLOVE) ×2
GLOVE SURG ORTHO 8.0 STRL STRW (GLOVE) ×3 IMPLANT
GOWN STRL REUS W/ TWL LRG LVL3 (GOWN DISPOSABLE) ×1 IMPLANT
GOWN STRL REUS W/TWL LRG LVL3 (GOWN DISPOSABLE) ×3
GOWN STRL REUS W/TWL XL LVL3 (GOWN DISPOSABLE) ×3 IMPLANT
NDL PRECISIONGLIDE 27X1.5 (NEEDLE) ×1 IMPLANT
NEEDLE PRECISIONGLIDE 27X1.5 (NEEDLE) ×3 IMPLANT
NS IRRIG 1000ML POUR BTL (IV SOLUTION) ×3 IMPLANT
PACK BASIN DAY SURGERY FS (CUSTOM PROCEDURE TRAY) ×3 IMPLANT
STOCKINETTE 4X48 STRL (DRAPES) ×3 IMPLANT
SUT ETHILON 4 0 PS 2 18 (SUTURE) ×3 IMPLANT
SYR BULB 3OZ (MISCELLANEOUS) ×3 IMPLANT
SYR CONTROL 10ML LL (SYRINGE) ×3 IMPLANT
TOWEL OR 17X24 6PK STRL BLUE (TOWEL DISPOSABLE) ×6 IMPLANT
UNDERPAD 30X30 (UNDERPADS AND DIAPERS) ×3 IMPLANT

## 2017-05-16 NOTE — Anesthesia Postprocedure Evaluation (Signed)
Anesthesia Post Note  Patient: Elijah Taylor  Procedure(s) Performed: RELEASE TRIGGER FINGER/A-1 PULLEY RIGHT MIDDLE FINGER (Right Hand)     Patient location during evaluation: PACU Anesthesia Type: Bier Block Level of consciousness: awake and alert Pain management: pain level controlled Vital Signs Assessment: post-procedure vital signs reviewed and stable Respiratory status: spontaneous breathing, nonlabored ventilation, respiratory function stable and patient connected to nasal cannula oxygen Cardiovascular status: stable and blood pressure returned to baseline Postop Assessment: no apparent nausea or vomiting Anesthetic complications: no    Last Vitals:  Vitals:   05/16/17 1115 05/16/17 1142  BP: 112/77 110/88  Pulse: 69 78  Resp: 14 16  Temp:  36.6 C  SpO2: 97% 100%    Last Pain:  Vitals:   05/16/17 0939  TempSrc: Oral  PainSc: 0-No pain                 Montez Hageman

## 2017-05-16 NOTE — H&P (Signed)
Elijah Taylor is an 66 y.o. male.   Chief Complaint: catching right middle finger HPI: Elijah Taylor is 66 year old right-handed. He was last seen 09/01/2014. Is complaining of catching of his right middle finger. He states this been going approximately 8 months. He has no history of injury. Is not had any treatment for this. He complains of a dull aching pain it is worse in the morning. With a VAS score of 8/10 requiring him to straighten his finger with his opposite hand. He has had carpal tunnel releases done by Dr. Derrel Nip and myself. He has had multiple trigger finger releases some by myself some by Dr. Derrel Nip. He has no history of diabetes thyroid problems arthritis or gout. He states that this is gradually getting worse. Family history is positive for iritis and high blood pressure.      Past Medical History:  Diagnosis Date  . Arthritis   . Chronic kidney disease    H/O KIDNEY STONES  . GERD (gastroesophageal reflux disease)   . Insomnia   . 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 Wears glasses     Past Surgical History:  Procedure Laterality Date  . CARPAL TUNNEL RELEASE     left  . CARPAL TUNNEL RELEASE Right 12/01/2014   Procedure: RIGHT CARPAL TUNNEL RELEASE;  Surgeon: Daryll Brod, MD;  Location: [facility];  Service: Orthopedics;  Laterality: Right;  . CERVICAL FUSION  2007  . COLONOSCOPY    . CYSTOSCOPY MACROPLASTIQUE IMPLANT N/A 09/15/2015   Procedure: CYSTOSCOPY MACROPLASTIQUE IMPLANT;  Surgeon: Royston Cowper, MD;  Location: [facility];  Service: Urology;  Laterality: N/A;  . RETROPUBIC PROSTATECTOMY  2010  . SHOULDER ARTHROSCOPY W/ ROTATOR CUFF REPAIR     right and left  . TRIGGER FINGER RELEASE     x 4  . TRIGGER FINGER RELEASE Left 09/02/2014   Procedure: RELEASE A-1 PULLEY LEFT INDEX FINGER;  Surgeon: Daryll Brod, MD;  Location: [facility];  Service: Orthopedics;  Laterality: Left;  ANESTHESIA: IV REGIONAL FAB    Family  History  Problem Relation Age of Onset  . Cancer Mother        Brain Tumor  . Heart disease Brother   . Heart attack Maternal Grandfather    Social History:  reports that he has never smoked. He has never used smokeless tobacco. He reports that he does not drink alcohol or use drugs.  Allergies: No Known Allergies  No prescriptions prior to admission.    No results found for this or any previous visit (from the past 48 hour(s)).  No results found.   Pertinent items are noted in HPI.  Height 5\' 9"  (1.753 m), weight 83.9 kg (185 lb).  General appearance: alert, cooperative and appears stated age Head: Normocephalic, without obvious abnormality Neck: no JVD Resp: clear to auscultation bilaterally Cardio: regular rate and rhythm, S1, S2 normal, no murmur, click, rub or gallop GI: soft, non-tender; bowel sounds normal; no masses,  no organomegaly Extremities:  catching right middle finger Pulses: 2+ and symmetric Skin: Skin color, texture, turgor normal. No rashes or lesions Neurologic: Grossly normal Incision/Wound: na  Assessment/Plan Assessment:  1. Trigger middle finger of right hand    Plan: He would like to proceed to have this surgically released. Pre-peri-and postoperative course are discussed along with risk complications. He is where there is no guarantee to the surgery the possibility of infection recurrence injury to arteries nerves tendons incomplete relief symptoms  dystrophy. Scheduled for  release right middle finger A1 pulley as an outpatient under regional anesthesia.      Sylvana Bonk R 05/16/2017, 8:32 AM

## 2017-05-16 NOTE — Transfer of Care (Signed)
Immediate Anesthesia Transfer of Care Note  Patient: Elijah Taylor  Procedure(s) Performed: RELEASE TRIGGER FINGER/A-1 PULLEY RIGHT MIDDLE FINGER (Right Hand)  Patient Location: PACU  Anesthesia Type:MAC and Bier block  Level of Consciousness: awake, alert  and oriented  Airway & Oxygen Therapy: Patient Spontanous Breathing  Post-op Assessment: Report given to RN and Post -op Vital signs reviewed and stable  Post vital signs: Reviewed and stable  Last Vitals:  Vitals:   05/16/17 0939 05/16/17 1055  BP: 116/72 (P) 112/82  Pulse: 83   Resp: 18 (P) 15  Temp: 36.7 C (P) 36.4 C  SpO2: 97%     Last Pain:  Vitals:   05/16/17 0939  TempSrc: Oral  PainSc: 0-No pain         Complications: No apparent anesthesia complications

## 2017-05-16 NOTE — Brief Op Note (Signed)
05/16/2017  10:47 AM  PATIENT:  Elijah Taylor  66 y.o. male  PRE-OPERATIVE DIAGNOSIS:  STENOSING TENOSYNOVITIS RIGHT MIDDLE FINGER M65.341  POST-OPERATIVE DIAGNOSIS:  STENOSING TENOSYNOVITIS RIGHT MIDDLE FINGER M65.341  PROCEDURE:  Procedure(s): RELEASE TRIGGER FINGER/A-1 PULLEY RIGHT MIDDLE FINGER (Right)  SURGEON:  Surgeon(s) and Role:    Daryll Brod, MD - Primary  PHYSICIAN ASSISTANT:   ASSISTANTS: none   ANESTHESIA:   local, regional and IV sedation  EBL:  No intake/output data recorded.  BLOOD ADMINISTERED:none  DRAINS: none   LOCAL MEDICATIONS USED:  BUPIVICAINE   SPECIMEN:  No Specimen  DISPOSITION OF SPECIMEN:  N/A  COUNTS:  YES  TOURNIQUET:   Total Tourniquet Time Documented: Forearm (Right) - 12 minutes Total: Forearm (Right) - 12 minutes   DICTATION: .Other Dictation: Dictation Number [id number]  PLAN OF CARE: Discharge to home after PACU  PATIENT DISPOSITION:  PACU - hemodynamically stable.

## 2017-05-16 NOTE — Discharge Instructions (Addendum)

## 2017-05-16 NOTE — Op Note (Signed)
NAMEWASIL, WOLKE NO.:  [id number]  MEDICAL RECORD NO.:  [id number]  LOCATION:                                 FACILITY:  PHYSICIAN:  Daryll Brod, M.D.            DATE OF BIRTH:  DATE OF PROCEDURE:  05/16/2017 DATE OF DISCHARGE:                              OPERATIVE REPORT   PREOPERATIVE DIAGNOSIS:  Stenosing tenosynovitis, right middle finger.  POSTOPERATIVE DIAGNOSIS:  Stenosing tenosynovitis, right middle finger.  OPERATION:  Release of A1 pulley, right middle finger.  SURGEON:  Daryll Brod, MD.  ANESTHESIA:  Forearm-based IV regional with local infiltration.  PLACE OF SURGERY:  [facility].  ANESTHESIOLOGIST:  [place].  HISTORY:  The patient is a 66 year old male with a history of triggering of his right middle finger.  He has elected to undergo surgical release foregoing injections and that he has had multiple digits release for triggering.  Pre, peri, and postoperative course have been discussed along with risks and complications.  He is aware that there is no guarantee to the surgery; the possibility of infection; recurrence of injury to arteries, nerves, tendons; incomplete relief of symptoms; and dystrophy.  In the preoperative area, the patient was seen, the extremity marked by both patient and surgeon.  Antibiotic given.  PROCEDURE IN DETAIL:  The patient was brought to the operating room, where a forearm-based IV regional anesthetic was carried out without difficulty under the direction of the Anesthesia Department.  He was prepped using ChloraPrep in the supine position with the right arm free. A 3-minute dry time was allowed, and time-out taken, confirming the patient and procedure.  An oblique incision was made over the A1 pulley of the right middle finger.  This was carried down through subcutaneous tissue.  Neurovascular structures were identified and protected.  The A1 pulley was identified.  This was released on its  radial aspect with a small incision made centrally in A2.  The tenosynovial tissue was then separated by separating the 2 tendons, to be certain there were no adhesions between the 2 tendons tenosynovial tissue.  The finger was placed through a full range motion.  No further triggering was noted. The wound was irrigated.  The skin was closed with interrupted 4-0 nylon sutures.  A local infiltration with 0.5% bupivacaine without epinephrine was given, approximately 5 mL was used.  A sterile compressive dressing was applied with the fingers free.  On deflation of the tourniquet, all fingers immediately pinked.  He was taken to recovery room for observation in satisfactory condition.  He will be discharged to home to return to the [facility] in 1 week, on Ultr[facility].          ______________________________ Daryll Brod, M.D.     GK/MEDQ  D:  05/16/2017  T:  05/16/2017  Job:  [id number]

## 2017-05-16 NOTE — Anesthesia Procedure Notes (Signed)
Anesthesia Regional Block: Bier block (IV Regional)   Pre-Anesthetic Checklist: ,, timeout performed, Correct Patient, Correct Site, Correct Laterality, Correct Procedure, Correct Position, site marked, Risks and benefits discussed, Surgical consent,  Pre-op evaluation,  At surgeon's request   Prep: chloraprep, alcohol swabs       Needles:  Injection technique: Single-shot      Additional Needles:   Procedures:,,,,,,,, #20gu IV placed  Narrative:

## 2017-05-16 NOTE — Anesthesia Preprocedure Evaluation (Signed)
Anesthesia Evaluation  Patient identified by MRN, date of birth, ID band Patient awake    Reviewed: Allergy & Precautions, NPO status , Patient's Chart, lab work & pertinent test results  Airway Mallampati: II  TM Distance: >3 FB Neck ROM: Full    Dental no notable dental hx.    Pulmonary neg pulmonary ROS,    Pulmonary exam normal breath sounds clear to auscultation       Cardiovascular negative cardio ROS Normal cardiovascular exam Rhythm:Regular Rate:Normal     Neuro/Psych negative neurological ROS  negative psych ROS   GI/Hepatic Neg liver ROS, GERD  Controlled,  Endo/Other  negative endocrine ROS  Renal/GU negative Renal ROS  negative genitourinary   Musculoskeletal negative musculoskeletal ROS (+)   Abdominal   Peds negative pediatric ROS (+)  Hematology negative hematology ROS (+)   Anesthesia Other Findings   Reproductive/Obstetrics negative OB ROS                             Anesthesia Physical Anesthesia Plan  ASA: II  Anesthesia Plan: Bier Block   Post-op Pain Management:    Induction:   PONV Risk Score and Plan: 0 and Treatment may vary due to age or medical condition  Airway Management Planned: Simple Face Mask  Additional Equipment:   Intra-op Plan:   Post-operative Plan:   Informed Consent: I have reviewed the patients History and Physical, chart, labs and discussed the procedure including the risks, benefits and alternatives for the proposed anesthesia with the patient or authorized representative who has indicated his/her understanding and acceptance.   Dental advisory given  Plan Discussed with: CRNA  Anesthesia Plan Comments:         Anesthesia Quick Evaluation

## 2017-05-16 NOTE — Op Note (Signed)
Dictation Number [id number]

## 2017-05-17 ENCOUNTER — Encounter (HOSPITAL_BASED_OUTPATIENT_CLINIC_OR_DEPARTMENT_OTHER): Payer: Self-pay | Admitting: Orthopedic Surgery

## 2017-05-17 NOTE — Addendum Note (Signed)
Addendum  created 05/17/17 0720 by Reylene Stauder, Ernesta Amble, CRNA   Charge Capture section accepted

## 2017-09-25 DIAGNOSIS — C61 Malignant neoplasm of prostate: Secondary | ICD-10-CM | POA: Diagnosis not present

## 2017-09-25 DIAGNOSIS — N393 Stress incontinence (female) (male): Secondary | ICD-10-CM | POA: Diagnosis not present

## 2017-09-25 DIAGNOSIS — D400 Neoplasm of uncertain behavior of prostate: Secondary | ICD-10-CM | POA: Diagnosis not present

## 2017-10-09 ENCOUNTER — Encounter: Payer: Medicare Other | Admitting: Family Medicine

## 2017-10-30 ENCOUNTER — Ambulatory Visit (INDEPENDENT_AMBULATORY_CARE_PROVIDER_SITE_OTHER): Payer: Medicare Other | Admitting: Family Medicine

## 2017-10-30 ENCOUNTER — Encounter: Payer: Self-pay | Admitting: Family Medicine

## 2017-10-30 VITALS — BP 105/71 | HR 76 | Temp 98.3°F | Wt 182.6 lb

## 2017-10-30 DIAGNOSIS — Z0000 Encounter for general adult medical examination without abnormal findings: Secondary | ICD-10-CM

## 2017-10-30 DIAGNOSIS — R5383 Other fatigue: Secondary | ICD-10-CM | POA: Diagnosis not present

## 2017-10-30 DIAGNOSIS — Z7189 Other specified counseling: Secondary | ICD-10-CM

## 2017-10-30 DIAGNOSIS — E7800 Pure hypercholesterolemia, unspecified: Secondary | ICD-10-CM

## 2017-10-30 DIAGNOSIS — C61 Malignant neoplasm of prostate: Secondary | ICD-10-CM

## 2017-10-30 LAB — URINALYSIS, ROUTINE W REFLEX MICROSCOPIC
BILIRUBIN UA: NEGATIVE
Glucose, UA: NEGATIVE
Ketones, UA: NEGATIVE
Leukocytes, UA: NEGATIVE
NITRITE UA: NEGATIVE
Protein, UA: NEGATIVE
RBC UA: NEGATIVE
SPEC GRAV UA: 1.02 (ref 1.005–1.030)
Urobilinogen, Ur: 0.2 mg/dL (ref 0.2–1.0)
pH, UA: 5.5 (ref 5.0–7.5)

## 2017-10-30 NOTE — Assessment & Plan Note (Addendum)
Followed by urology.   

## 2017-10-30 NOTE — Progress Notes (Signed)
BP 105/71 (BP Location: Left Arm, Patient Position: Sitting, Cuff Size: Normal)   Pulse 76   Temp 98.3 F (36.8 C) (Oral)   Wt 182 lb 9 oz (82.8 kg)   SpO2 98%   BMI 26.96 kg/m    Subjective:    Patient ID: Elijah Taylor, male    DOB: 1950/11/24, 67 y.o.   MRN: [id number]  HPI: Elijah Taylor is a 67 y.o. male  Annual exam Patient concerned over the last couple weeks is noted to have low blood pressure and some lightheaded dizzy type sensation momentarily when standing.  No real change in diet or other issues no change in medications.  Does relate to a low salt diet. Also has an occasional medication for sleep gets from urology.  Patient also had some bright red rectal bleeding probably from hemorrhoids but wondering about colonoscopy on chart review last colonoscopy was 2012 with recommendation to repeat in 5 years so we will go ahead and schedule.  Relevant past medical, surgical, family and social history reviewed and updated as indicated. Interim medical history since our last visit reviewed. Allergies and medications reviewed and updated.  Review of Systems  Constitutional: Negative.   HENT: Negative.   Eyes: Negative.   Respiratory: Negative.   Cardiovascular: Negative.   Gastrointestinal: Negative.   Endocrine: Negative.   Genitourinary: Negative.   Musculoskeletal: Negative.   Skin: Negative.   Allergic/Immunologic: Negative.   Neurological: Negative.   Hematological: Negative.   Psychiatric/Behavioral: Negative.     Per HPI unless specifically indicated above     Objective:    BP 105/71 (BP Location: Left Arm, Patient Position: Sitting, Cuff Size: Normal)   Pulse 76   Temp 98.3 F (36.8 C) (Oral)   Wt 182 lb 9 oz (82.8 kg)   SpO2 98%   BMI 26.96 kg/m   Wt Readings from Last 3 Encounters:  10/30/17 182 lb 9 oz (82.8 kg)  05/16/17 182 lb 6.4 oz (82.7 kg)  03/01/17 182 lb 12.8 oz (82.9 kg)    Physical Exam  Constitutional: He is oriented  to person, place, and time. He appears well-developed and well-nourished.  HENT:  Head: Normocephalic and atraumatic.  Right Ear: External ear normal.  Left Ear: External ear normal.  Eyes: Pupils are equal, round, and reactive to light. Conjunctivae and EOM are normal.  Neck: Normal range of motion. Neck supple.  Cardiovascular: Normal rate, regular rhythm, normal heart sounds and intact distal pulses.  Pulmonary/Chest: Effort normal and breath sounds normal.  Abdominal: Soft. Bowel sounds are normal. There is no splenomegaly or hepatomegaly.  Genitourinary:  Genitourinary Comments: Done at urology  Musculoskeletal: Normal range of motion.  Neurological: He is alert and oriented to person, place, and time. He has normal reflexes.  Skin: No rash noted. No erythema.  Psychiatric: He has a normal mood and affect. His behavior is normal. Judgment and thought content normal.    Results for orders placed or performed in visit on 10/06/16  Comprehensive metabolic panel  Result Value Ref Range   Glucose 92 65 - 99 mg/dL   BUN 16 8 - 27 mg/dL   Creatinine, Ser 1.14 0.76 - 1.27 mg/dL   GFR calc non Af Amer 66 >59 mL/min/1.73   GFR calc Af Amer 77 >59 mL/min/1.73   BUN/Creatinine Ratio 14 10 - 24   Sodium 142 134 - 144 mmol/L   Potassium 4.5 3.5 - 5.2 mmol/L   Chloride 103 96 - 106 mmol/L  CO2 26 18 - 29 mmol/L   Calcium 9.3 8.6 - 10.2 mg/dL   Total Protein 6.7 6.0 - 8.5 g/dL   Albumin 4.3 3.6 - 4.8 g/dL   Globulin, Total 2.4 1.5 - 4.5 g/dL   Albumin/Globulin Ratio 1.8 1.2 - 2.2   Bilirubin Total 0.6 0.0 - 1.2 mg/dL   Alkaline Phosphatase 74 39 - 117 IU/L   AST 19 0 - 40 IU/L   ALT 16 0 - 44 IU/L  Lipid panel  Result Value Ref Range   Cholesterol, Total 174 100 - 199 mg/dL   Triglycerides 140 0 - 149 mg/dL   HDL 35 (L) >39 mg/dL   VLDL Cholesterol Cal 28 5 - 40 mg/dL   LDL Calculated 111 (H) 0 - 99 mg/dL   Chol/HDL Ratio 5.0 0.0 - 5.0 ratio units  CBC with Differential/Platelet   Result Value Ref Range   WBC 7.3 3.4 - 10.8 x10E3/uL   RBC 5.03 4.14 - 5.80 x10E6/uL   Hemoglobin 15.7 13.0 - 17.7 g/dL   Hematocrit 45.1 37.5 - 51.0 %   MCV 90 79 - 97 fL   MCH 31.2 26.6 - 33.0 pg   MCHC 34.8 31.5 - 35.7 g/dL   RDW 13.5 12.3 - 15.4 %   Platelets 317 150 - 379 x10E3/uL   Neutrophils 59 Not Estab. %   Lymphs 29 Not Estab. %   Monocytes 7 Not Estab. %   Eos 4 Not Estab. %   Basos 1 Not Estab. %   Neutrophils Absolute 4.3 1.4 - 7.0 x10E3/uL   Lymphocytes Absolute 2.1 0.7 - 3.1 x10E3/uL   Monocytes Absolute 0.5 0.1 - 0.9 x10E3/uL   EOS (ABSOLUTE) 0.3 0.0 - 0.4 x10E3/uL   Basophils Absolute 0.1 0.0 - 0.2 x10E3/uL   Immature Granulocytes 0 Not Estab. %   Immature Grans (Abs) 0.0 0.0 - 0.1 x10E3/uL  TSH  Result Value Ref Range   TSH 0.926 0.450 - 4.500 uIU/mL  Urinalysis, Routine w reflex microscopic  Result Value Ref Range   Specific Gravity, UA 1.015 1.005 - 1.030   pH, UA 7.5 5.0 - 7.5   Color, UA Yellow Yellow   Appearance Ur Clear Clear   Leukocytes, UA Negative Negative   Protein, UA Negative Negative/Trace   Glucose, UA Negative Negative   Ketones, UA Negative Negative   RBC, UA Negative Negative   Bilirubin, UA Negative Negative   Urobilinogen, Ur 1.0 0.2 - 1.0 mg/dL   Nitrite, UA Negative Negative  Hepatitis C Antibody  Result Value Ref Range   Hep C Virus Ab <0.1 0.0 - 0.9 s/co ratio      Assessment & Plan:   Problem List Items Addressed This Visit      Genitourinary   Prostate cancer ([place]) - Primary    Followed by urology        Other   Fatigue   Relevant Orders   CBC with Differential/Platelet   Comprehensive metabolic panel   TSH   Hypercholesteremia   Relevant Orders   Lipid panel   Advanced care planning/counseling discussion    A voluntary discussion about advance care planning including the explanation and discussion of advance directives was extensively discussed  with the patient.  Explanation about the health care proxy  and Living will was reviewed and packet with forms with explanation of how to fill them out was given.   Time spent:   Encounter 16+ min     Individuals present: pt.  Other Visit Diagnoses    Annual physical exam       Relevant Orders   CBC with Differential/Platelet   Lipid panel   Comprehensive metabolic panel   TSH   Urinalysis, Routine w reflex microscopic   Ambulatory referral to Gastroenterology    Appointment for colonoscopy to follow-up bleeding  Follow up plan: Return in about 1 year (around 10/31/2018), or if symptoms worsen or fail to improve, for Physical Exam.

## 2017-10-30 NOTE — Assessment & Plan Note (Signed)
A voluntary discussion about advance care planning including the explanation and discussion of advance directives was extensively discussed  with the patient.  Explanation about the health care proxy and Living will was reviewed and packet with forms with explanation of how to fill them out was given.    Time spent:   Encounter 16+ min     Individuals present pt  

## 2017-10-31 ENCOUNTER — Encounter: Payer: Self-pay | Admitting: Family Medicine

## 2017-10-31 LAB — CBC WITH DIFFERENTIAL/PLATELET
BASOS: 1 %
Basophils Absolute: 0.1 10*3/uL (ref 0.0–0.2)
EOS (ABSOLUTE): 0.1 10*3/uL (ref 0.0–0.4)
EOS: 2 %
HEMATOCRIT: 48.8 % (ref 37.5–51.0)
Hemoglobin: 16.4 g/dL (ref 13.0–17.7)
Immature Grans (Abs): 0 10*3/uL (ref 0.0–0.1)
Immature Granulocytes: 0 %
LYMPHS ABS: 2.1 10*3/uL (ref 0.7–3.1)
Lymphs: 32 %
MCH: 30.9 pg (ref 26.6–33.0)
MCHC: 33.6 g/dL (ref 31.5–35.7)
MCV: 92 fL (ref 79–97)
MONOS ABS: 0.7 10*3/uL (ref 0.1–0.9)
Monocytes: 10 %
Neutrophils Absolute: 3.8 10*3/uL (ref 1.4–7.0)
Neutrophils: 55 %
Platelets: 319 10*3/uL (ref 150–379)
RBC: 5.3 x10E6/uL (ref 4.14–5.80)
RDW: 13.7 % (ref 12.3–15.4)
WBC: 6.8 10*3/uL (ref 3.4–10.8)

## 2017-10-31 LAB — COMPREHENSIVE METABOLIC PANEL
A/G RATIO: 1.6 (ref 1.2–2.2)
ALT: 21 IU/L (ref 0–44)
AST: 19 IU/L (ref 0–40)
Albumin: 4.4 g/dL (ref 3.6–4.8)
Alkaline Phosphatase: 59 IU/L (ref 39–117)
BILIRUBIN TOTAL: 0.5 mg/dL (ref 0.0–1.2)
BUN/Creatinine Ratio: 15 (ref 10–24)
BUN: 16 mg/dL (ref 8–27)
CALCIUM: 9.9 mg/dL (ref 8.6–10.2)
CHLORIDE: 103 mmol/L (ref 96–106)
CO2: 27 mmol/L (ref 20–29)
Creatinine, Ser: 1.09 mg/dL (ref 0.76–1.27)
GFR calc non Af Amer: 69 mL/min/{1.73_m2} (ref >59)
GFR, EST AFRICAN AMERICAN: 80 mL/min/{1.73_m2} (ref >59)
Globulin, Total: 2.8 g/dL (ref 1.5–4.5)
Glucose: 91 mg/dL (ref 65–99)
POTASSIUM: 4.6 mmol/L (ref 3.5–5.2)
Sodium: 143 mmol/L (ref 134–144)
TOTAL PROTEIN: 7.2 g/dL (ref 6.0–8.5)

## 2017-10-31 LAB — TSH: TSH: 1.17 u[IU]/mL (ref 0.450–4.500)

## 2017-10-31 LAB — LIPID PANEL
CHOL/HDL RATIO: 4.6 ratio (ref 0.0–5.0)
Cholesterol, Total: 203 mg/dL — ABNORMAL HIGH (ref 100–199)
HDL: 44 mg/dL (ref >39)
LDL CALC: 138 mg/dL — AB (ref 0–99)
Triglycerides: 107 mg/dL (ref 0–149)
VLDL Cholesterol Cal: 21 mg/dL (ref 5–40)

## 2017-11-02 ENCOUNTER — Telehealth: Payer: Self-pay

## 2017-11-02 ENCOUNTER — Other Ambulatory Visit: Payer: Self-pay

## 2017-11-02 DIAGNOSIS — Z860100 Personal history of colon polyps, unspecified: Secondary | ICD-10-CM

## 2017-11-02 MED ORDER — PEG 3350-KCL-NABCB-NACL-NASULF 236 G PO SOLR: 4000.0000 mL | Freq: Once | ORAL | 0 refills | Status: AC

## 2017-11-02 NOTE — Telephone Encounter (Signed)
Gastroenterology Pre-Procedure Review  Request Date:  Requesting Physician: Dr.   PATIENT REVIEW QUESTIONS: The patient responded to the following health history questions as indicated:    1. Are you having any GI issues? no 2. Do you have a personal history of Polyps? yes (hx of polyps ) 3. Do you have a family history of Colon Cancer or Polyps? no 4. Diabetes Mellitus? no 5. Joint replacements in the past 12 months?no 6. Major health problems in the past 3 months?no 7. Any artificial heart valves, MVP, or defibrillator?no    MEDICATIONS & ALLERGIES:    Patient reports the following regarding taking any anticoagulation/antiplatelet therapy:   Plavix, Coumadin, Eliquis, Xarelto, Lovenox, Pradaxa, Brilinta, or Effient? no Aspirin? yes (ASA 81mg )  Patient confirms/reports the following medications:  Current Outpatient Medications  Medication Sig Dispense Refill  . aspirin 81 MG tablet Take 81 mg by mouth daily.    . Calcium Carbonate Antacid (ALKA-SELTZER ANTACID PO) Take 1 tablet by mouth as needed.    . Multiple Vitamins-Minerals (MULTIVITAMIN WITH MINERALS) tablet Take 1 tablet by mouth daily.    . polyethylene glycol (GOLYTELY) 236 g solution Take 4,000 mLs by mouth once for 1 dose. 4000 mL 0  . triazolam (HALCION) 0.25 MG tablet TAKE 1 TABLET BY MOUTH EVERYDAY AS NEEDED FOR INSOMNIA  5   No current facility-administered medications for this visit.     Patient confirms/reports the following allergies:  No Known Allergies  No orders of the defined types were placed in this encounter.   AUTHORIZATION INFORMATION Primary Insurance: 1D#: Group #:  Secondary Insurance: 1D#: Group #:  SCHEDULE INFORMATION: Date: 11/07/17 Time: Location: [facility]

## 2017-11-07 ENCOUNTER — Ambulatory Visit: Payer: Medicare Other | Admitting: Anesthesiology

## 2017-11-07 ENCOUNTER — Encounter
Admission: RE | Disposition: A | Discharge status: Home / Self Care | Payer: Self-pay | Source: Ambulatory Visit | Attending: Gastroenterology

## 2017-11-07 ENCOUNTER — Ambulatory Visit
Admission: RE | Admit: 2017-11-07 | Discharge: 2017-11-07 | Disposition: A | Discharge status: Home / Self Care | Payer: Medicare Other | Source: Ambulatory Visit | Attending: Gastroenterology | Admitting: Gastroenterology

## 2017-11-07 DIAGNOSIS — Z981 Arthrodesis status: Secondary | ICD-10-CM | POA: Insufficient documentation

## 2017-11-07 DIAGNOSIS — G4700 Insomnia, unspecified: Secondary | ICD-10-CM | POA: Diagnosis not present

## 2017-11-07 DIAGNOSIS — Z79899 Other long term (current) drug therapy: Secondary | ICD-10-CM | POA: Insufficient documentation

## 2017-11-07 DIAGNOSIS — Z7982 Long term (current) use of aspirin: Secondary | ICD-10-CM | POA: Insufficient documentation

## 2017-11-07 DIAGNOSIS — D124 Benign neoplasm of descending colon: Secondary | ICD-10-CM | POA: Insufficient documentation

## 2017-11-07 DIAGNOSIS — K5730 Diverticulosis of large intestine without perforation or abscess without bleeding: Secondary | ICD-10-CM | POA: Diagnosis not present

## 2017-11-07 DIAGNOSIS — Z8249 Family history of ischemic heart disease and other diseases of the circulatory system: Secondary | ICD-10-CM | POA: Insufficient documentation

## 2017-11-07 DIAGNOSIS — Z86010 Personal history of colonic polyps: Secondary | ICD-10-CM | POA: Diagnosis not present

## 2017-11-07 DIAGNOSIS — M1990 Unspecified osteoarthritis, unspecified site: Secondary | ICD-10-CM | POA: Diagnosis not present

## 2017-11-07 DIAGNOSIS — Z1211 Encounter for screening for malignant neoplasm of colon: Secondary | ICD-10-CM | POA: Insufficient documentation

## 2017-11-07 DIAGNOSIS — K219 Gastro-esophageal reflux disease without esophagitis: Secondary | ICD-10-CM | POA: Insufficient documentation

## 2017-11-07 DIAGNOSIS — Z87442 Personal history of urinary calculi: Secondary | ICD-10-CM | POA: Insufficient documentation

## 2017-11-07 DIAGNOSIS — K635 Polyp of colon: Secondary | ICD-10-CM | POA: Diagnosis not present

## 2017-11-07 DIAGNOSIS — K5790 Diverticulosis of intestine, part unspecified, without perforation or abscess without bleeding: Secondary | ICD-10-CM | POA: Diagnosis not present

## 2017-11-07 HISTORY — PX: COLONOSCOPY WITH PROPOFOL: SHX5780

## 2017-11-07 SURGERY — COLONOSCOPY WITH PROPOFOL
Anesthesia: General

## 2017-11-07 MED ORDER — LIDOCAINE HCL (PF) 1 % IJ SOLN
2.0000 mL | Freq: Once | INTRAMUSCULAR | Status: AC
  Administered 2017-11-07: 0.3 mL via INTRADERMAL

## 2017-11-07 MED ORDER — PROPOFOL 10 MG/ML IV BOLUS
INTRAVENOUS | Status: AC
  Filled 2017-11-07: qty 20

## 2017-11-07 MED ORDER — PROPOFOL 500 MG/50ML IV EMUL
INTRAVENOUS | Status: DC | PRN
  Administered 2017-11-07: 150 ug/kg/min via INTRAVENOUS

## 2017-11-07 MED ORDER — SODIUM CHLORIDE 0.9 % IV SOLN
INTRAVENOUS | Status: DC
  Administered 2017-11-07: 1000 mL via INTRAVENOUS

## 2017-11-07 MED ORDER — PROPOFOL 10 MG/ML IV BOLUS
INTRAVENOUS | Status: DC | PRN
  Administered 2017-11-07: 10 mg via INTRAVENOUS
  Administered 2017-11-07 (×3): 20 mg via INTRAVENOUS
  Administered 2017-11-07: 50 mg via INTRAVENOUS
  Administered 2017-11-07 (×2): 30 mg via INTRAVENOUS
  Administered 2017-11-07: 20 mg via INTRAVENOUS

## 2017-11-07 MED ORDER — LIDOCAINE HCL (PF) 1 % IJ SOLN
INTRAMUSCULAR | Status: AC
  Administered 2017-11-07: 0.3 mL via INTRADERMAL
  Filled 2017-11-07: qty 2

## 2017-11-07 MED ORDER — LIDOCAINE HCL (PF) 2 % IJ SOLN
INTRAMUSCULAR | Status: AC
  Filled 2017-11-07: qty 10

## 2017-11-07 MED ORDER — LIDOCAINE HCL (CARDIAC) 20 MG/ML IV SOLN
INTRAVENOUS | Status: DC | PRN
  Administered 2017-11-07: 50 mg via INTRAVENOUS

## 2017-11-07 NOTE — Anesthesia Preprocedure Evaluation (Signed)
Anesthesia Evaluation  Patient identified by MRN, date of birth, ID band Patient awake    Reviewed: Allergy & Precautions, NPO status , Patient's Chart, lab work & pertinent test results  History of Anesthesia Complications Negative for: history of anesthetic complications  Airway Mallampati: I       Dental   Pulmonary neg sleep apnea, neg COPD,           Cardiovascular (-) hypertension(-) Past MI and (-) CHF (-) dysrhythmias (-) Valvular Problems/Murmurs     Neuro/Psych neg Seizures    GI/Hepatic Neg liver ROS, GERD: pt denies.  ,  Endo/Other  neg diabetes  Renal/GU Renal InsufficiencyRenal disease     Musculoskeletal   Abdominal   Peds  Hematology   Anesthesia Other Findings   Reproductive/Obstetrics                             Anesthesia Physical Anesthesia Plan  ASA: II  Anesthesia Plan: General   Post-op Pain Management:    Induction: Intravenous  PONV Risk Score and Plan: 2 and Propofol infusion and TIVA  Airway Management Planned: Nasal Cannula  Additional Equipment:   Intra-op Plan:   Post-operative Plan:   Informed Consent: I have reviewed the patients History and Physical, chart, labs and discussed the procedure including the risks, benefits and alternatives for the proposed anesthesia with the patient or authorized representative who has indicated his/her understanding and acceptance.     Plan Discussed with:   Anesthesia Plan Comments:         Anesthesia Quick Evaluation

## 2017-11-07 NOTE — Anesthesia Post-op Follow-up Note (Signed)
Anesthesia QCDR form completed.        

## 2017-11-07 NOTE — Anesthesia Postprocedure Evaluation (Signed)
Anesthesia Post Note  Patient: Elijah Taylor  Procedure(s) Performed: COLONOSCOPY WITH PROPOFOL (N/A )  Patient location during evaluation: Endoscopy Anesthesia Type: General Level of consciousness: awake and alert Pain management: pain level controlled Vital Signs Assessment: post-procedure vital signs reviewed and stable Respiratory status: spontaneous breathing and respiratory function stable Cardiovascular status: stable Anesthetic complications: no     Last Vitals:  Vitals:   11/07/17 0958 11/07/17 1008  BP: 117/80 117/88  Pulse: 86 83  Resp: (!) 8 17  Temp:    SpO2: 100% 98%    Last Pain:  Vitals:   11/07/17 1008  TempSrc:   PainSc: 0-No pain                 KEPHART,WILLIAM K

## 2017-11-07 NOTE — H&P (Signed)
Elijah Bellows, MD [address] [place], [place], [place], [address] [address], [place], [place], [place], [address] Phone: [phone]  Fax: [phone]  Primary Care Physician:  Guadalupe Maple, MD   Pre-Procedure History & Physical: HPI:  Elijah Taylor is a 67 y.o. male is here for an colonoscopy.   Past Medical History:  Diagnosis Date  . Arthritis   . Chronic kidney disease    H/O KIDNEY STONES  . GERD (gastroesophageal reflux disease)   . Insomnia   . 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 Wears glasses     Past Surgical History:  Procedure Laterality Date  . CARPAL TUNNEL RELEASE     left  . CARPAL TUNNEL RELEASE Right 12/01/2014   Procedure: RIGHT CARPAL TUNNEL RELEASE;  Surgeon: Daryll Brod, MD;  Location: [facility];  Service: Orthopedics;  Laterality: Right;  . CERVICAL FUSION  2007  . COLONOSCOPY    . CYSTOSCOPY MACROPLASTIQUE IMPLANT N/A 09/15/2015   Procedure: CYSTOSCOPY MACROPLASTIQUE IMPLANT;  Surgeon: Royston Cowper, MD;  Location: [facility];  Service: Urology;  Laterality: N/A;  . RETROPUBIC PROSTATECTOMY  2010  . SHOULDER ARTHROSCOPY W/ ROTATOR CUFF REPAIR     right and left  . TRIGGER FINGER RELEASE     x 4  . TRIGGER FINGER RELEASE Left 09/02/2014   Procedure: RELEASE A-1 PULLEY LEFT INDEX FINGER;  Surgeon: Daryll Brod, MD;  Location: [facility];  Service: Orthopedics;  Laterality: Left;  ANESTHESIA: IV REGIONAL FAB  . TRIGGER FINGER RELEASE Right 05/16/2017   Procedure: RELEASE TRIGGER FINGER/A-1 PULLEY RIGHT MIDDLE FINGER;  Surgeon: Daryll Brod, MD;  Location: [place];  Service: Orthopedics;  Laterality: Right;    Prior to Admission medications   Medication Sig Start Date End Date Taking? Authorizing Provider  aspirin 81 MG tablet Take 81 mg by mouth daily.    [provider]  Calcium Carbonate Antacid (ALKA-SELTZER ANTACID PO) Take 1 tablet by mouth as needed.     [provider]  Multiple Vitamins-Minerals (MULTIVITAMIN WITH MINERALS) tablet Take 1 tablet by mouth daily.    [provider]  triazolam (HALCION) 0.25 MG tablet TAKE 1 TABLET BY MOUTH EVERYDAY AS NEEDED FOR INSOMNIA 03/06/15   [provider]    Allergies as of 11/02/2017  . (No Known Allergies)    Family History  Problem Relation Age of Onset  . Cancer Mother        Brain Tumor  . Heart disease Brother   . Heart attack Maternal Grandfather     Social History   Socioeconomic History  . Marital status: Married    Spouse name: Not on file  . Number of children: Not on file  . Years of education: Not on file  . Highest education level: Not on file  Occupational History  . Not on file  Social Needs  . Financial resource strain: Not on file  . Food insecurity:    Worry: Not on file    Inability: Not on file  . Transportation needs:    Medical: Not on file    Non-medical: Not on file  Tobacco Use  . Smoking status: Never Smoker  . Smokeless tobacco: Never Used  Substance and Sexual Activity  . Alcohol use: No  . Drug use: No  . Sexual activity: Not Currently  Lifestyle  . Physical activity:    Days per week: Not on file  Minutes per session: Not on file  . Stress: Not on file  Relationships  . Social connections:    Talks on phone: Not on file    Gets together: Not on file    Attends religious service: Not on file    Active member of club or organization: Not on file    Attends meetings of clubs or organizations: Not on file    Relationship status: Not on file  . Intimate partner violence:    Fear of current or ex partner: Not on file    Emotionally abused: Not on file    Physically abused: Not on file    Forced sexual activity: Not on file  Other Topics Concern  . Not on file  Social History Narrative  . Not on file    Review of Systems: See HPI, otherwise negative ROS  Physical Exam: There were no vitals taken for this  visit. General:   Alert,  pleasant and cooperative in NAD Head:  Normocephalic and atraumatic. Neck:  Supple; no masses or thyromegaly. Lungs:  Clear throughout to auscultation, normal respiratory effort.    Heart:  +S1, +S2, Regular rate and rhythm, No edema. Abdomen:  Soft, nontender and nondistended. Normal bowel sounds, without guarding, and without rebound.   Neurologic:  Alert and  oriented x4;  grossly normal neurologically.  Impression/Plan: Elijah Taylor is here for an colonoscopy to be performed for surveillance due to prior history of colon polyps   Risks, benefits, limitations, and alternatives regarding  colonoscopy have been reviewed with the patient.  Questions have been answered.  All parties agreeable.   Elijah Bellows, MD  11/07/2017, 8:51 AM

## 2017-11-07 NOTE — Transfer of Care (Signed)
Immediate Anesthesia Transfer of Care Note  Patient: Elijah Taylor  Procedure(s) Performed: COLONOSCOPY WITH PROPOFOL (N/A )  Patient Location: PACU  Anesthesia Type:General  Level of Consciousness: awake, oriented and drowsy  Airway & Oxygen Therapy: Patient Spontanous Breathing and Patient connected to nasal cannula oxygen  Post-op Assessment: Report given to RN and Post -op Vital signs reviewed and stable  Post vital signs: Reviewed and stable  Last Vitals:  Vitals Value Taken Time  BP 108/70 11/07/2017  9:48 AM  Temp 36.1 C 11/07/2017  9:48 AM  Pulse 91 11/07/2017  9:48 AM  Resp 12 11/07/2017  9:48 AM  SpO2 100 % 11/07/2017  9:48 AM  Vitals shown include unvalidated device data.  Last Pain:  Vitals:   11/07/17 0948  TempSrc: Tympanic  PainSc:          Complications: No apparent anesthesia complications and Patient re-intubated

## 2017-11-07 NOTE — Anesthesia Procedure Notes (Addendum)
Procedure Name: MAC Date/Time: 11/07/2017 9:23 AM Performed by: Rudean Hitt, CRNA Pre-anesthesia Checklist: Emergency Drugs available, Patient identified, Suction available, Patient being monitored and Timeout performed Patient Re-evaluated:Patient Re-evaluated prior to induction Oxygen Delivery Method: Nasal cannula Induction Type: IV induction

## 2017-11-07 NOTE — Op Note (Signed)
[facility]astroenter[facility] Patient Name: Elijah Taylor Procedure Date: 11/07/2017 9:19 AM MRN: [id number] Account #: [id number] Date of Birth: 27-Sep-1950 Admit Type: Outpatient Age: 67 Room: [facility] ENDO ROOM 1 Gender: Male Note Status: Finalized Procedure:            Colonoscopy Indications:          High risk colon cancer surveillance: Personal history                        of colonic polyps Providers:            [name] MD, MD Referring MD:         Guadalupe Maple, MD (Referring MD) Medicines:            Monitored Anesthesia Care Complications:        No immediate complications. Procedure:            Pre-Anesthesia Assessment:                       - Prior to the procedure, a History and Physical was                        performed, and patient medications, allergies and                        sensitivities were reviewed. The patient's tolerance of                        previous anesthesia was reviewed.                       - The risks and benefits of the procedure and the                        sedation options and risks were discussed with the                        patient. All questions were answered and informed                        consent was obtained.                       - ASA Grade Assessment: III - A patient with severe                        systemic disease.                       After obtaining informed consent, the colonoscope was                        passed under direct vision. Throughout the procedure,                        the patient's blood pressure, pulse, and oxygen                        saturations were monitored continuously. The  Colonoscope was introduced through the anus and                        advanced to the the cecum, identified by the                        appendiceal orifice, IC valve and transillumination.                        The colonoscopy was performed with ease. The patient                     tolerated the procedure well. The quality of the bowel                        preparation was good. Findings:      The perianal and digital rectal examinations were normal.      Multiple small-mouthed diverticula were found in the sigmoid colon.      A 5 mm polyp was found in the descending colon. The polyp was sessile.       The polyp was removed with a cold snare. Resection and retrieval were       complete.      The exam was otherwise without abnormality on direct and retroflexion       views. Impression:           - Diverticulosis in the sigmoid colon.                       - One 5 mm polyp in the descending colon, removed with                        a cold snare. Resected and retrieved.                       - The examination was otherwise normal on direct and                        retroflexion views. Recommendation:       - Discharge patient to home (with escort).                       - Resume previous diet.                       - Continue present medications.                       - Await pathology results.                       - Repeat colonoscopy in 5 years for surveillance. Procedure Code(s):    --- Professional ---                       [id number], Colonoscopy, flexible; with removal of tumor(s),                        polyp(s), or other lesion(s) by snare technique Diagnosis Code(s):    --- Professional ---  Z86.010, Personal history of colonic polyps                       D12.4, Benign neoplasm of descending colon                       K57.30, Diverticulosis of large intestine without                        perforation or abscess without bleeding CPT copyright 2017 [employer]. All rights reserved. The codes documented in this report are preliminary and upon coder review may  be revised to meet current compliance requirements. [name], MD [name] MD, MD 11/07/2017 9:44:43 AM This report has been signed  electronically. Number of Addenda: 0 Note Initiated On: 11/07/2017 9:19 AM Scope Withdrawal Time: 0 hours 14 minutes 47 seconds  Total Procedure Duration: 0 hours 17 minutes 17 seconds       [facility]

## 2017-11-08 LAB — SURGICAL PATHOLOGY

## 2017-11-09 ENCOUNTER — Encounter: Payer: Self-pay | Admitting: Gastroenterology

## 2018-02-13 DIAGNOSIS — H2513 Age-related nuclear cataract, bilateral: Secondary | ICD-10-CM | POA: Diagnosis not present

## 2018-05-08 DIAGNOSIS — H2511 Age-related nuclear cataract, right eye: Secondary | ICD-10-CM | POA: Diagnosis not present

## 2018-05-08 DIAGNOSIS — H18413 Arcus senilis, bilateral: Secondary | ICD-10-CM | POA: Diagnosis not present

## 2018-05-08 DIAGNOSIS — H2513 Age-related nuclear cataract, bilateral: Secondary | ICD-10-CM | POA: Diagnosis not present

## 2018-05-08 DIAGNOSIS — H25043 Posterior subcapsular polar age-related cataract, bilateral: Secondary | ICD-10-CM | POA: Diagnosis not present

## 2018-05-08 DIAGNOSIS — H25013 Cortical age-related cataract, bilateral: Secondary | ICD-10-CM | POA: Diagnosis not present

## 2018-05-08 DIAGNOSIS — H02831 Dermatochalasis of right upper eyelid: Secondary | ICD-10-CM | POA: Diagnosis not present

## 2018-05-30 ENCOUNTER — Encounter

## 2018-05-30 ENCOUNTER — Ambulatory Visit
Admission: RE | Admit: 2018-05-30 | Discharge: 2018-05-30 | Disposition: A | Discharge status: Home / Self Care | Payer: Medicare Other | Source: Ambulatory Visit | Attending: Family Medicine | Admitting: Family Medicine

## 2018-05-30 ENCOUNTER — Ambulatory Visit (INDEPENDENT_AMBULATORY_CARE_PROVIDER_SITE_OTHER): Payer: Medicare Other | Admitting: Family Medicine

## 2018-05-30 ENCOUNTER — Encounter: Payer: Self-pay | Admitting: Family Medicine

## 2018-05-30 VITALS — BP 129/88 | HR 97 | Temp 98.1°F | Wt 186.0 lb

## 2018-05-30 DIAGNOSIS — K5900 Constipation, unspecified: Secondary | ICD-10-CM | POA: Insufficient documentation

## 2018-05-30 DIAGNOSIS — R109 Unspecified abdominal pain: Secondary | ICD-10-CM

## 2018-05-30 DIAGNOSIS — R319 Hematuria, unspecified: Secondary | ICD-10-CM

## 2018-05-30 LAB — CBC WITH DIFFERENTIAL/PLATELET
HEMATOCRIT: 47.5 % (ref 37.5–51.0)
Hemoglobin: 16.8 g/dL (ref 13.0–17.7)
LYMPHS ABS: 2.2 10*3/uL (ref 0.7–3.1)
LYMPHS: 21 %
MCH: 32.6 pg (ref 26.6–33.0)
MCHC: 35.4 g/dL (ref 31.5–35.7)
MCV: 92 fL (ref 79–97)
MID (Absolute): 1.1 10*3/uL (ref 0.1–1.6)
MID: 10 %
NEUTROS PCT: 69 %
Neutrophils Absolute: 7.3 10*3/uL — ABNORMAL HIGH (ref 1.4–7.0)
Platelets: 275 10*3/uL (ref 150–450)
RBC: 5.15 x10E6/uL (ref 4.14–5.80)
RDW: 12.8 % (ref 12.3–15.4)
WBC: 10.6 10*3/uL (ref 3.4–10.8)

## 2018-05-30 LAB — UA/M W/RFLX CULTURE, ROUTINE
BILIRUBIN UA: NEGATIVE
Glucose, UA: NEGATIVE
Ketones, UA: NEGATIVE
LEUKOCYTES UA: NEGATIVE
Nitrite, UA: NEGATIVE
PH UA: 6 (ref 5.0–7.5)
PROTEIN UA: NEGATIVE
Specific Gravity, UA: 1.015 (ref 1.005–1.030)
Urobilinogen, Ur: 0.2 mg/dL (ref 0.2–1.0)

## 2018-05-30 LAB — MICROSCOPIC EXAMINATION: Bacteria, UA: NONE SEEN

## 2018-05-30 MED ORDER — TAMSULOSIN HCL 0.4 MG PO CAPS: 0.4000 mg | ORAL_CAPSULE | Freq: Every day | ORAL | 0 refills | Status: DC

## 2018-05-30 MED ORDER — KETOROLAC TROMETHAMINE 60 MG/2ML IM SOLN
60.0000 mg | Freq: Once | INTRAMUSCULAR | Status: AC
  Administered 2018-05-30: 60 mg via INTRAMUSCULAR

## 2018-05-30 MED ORDER — TRAMADOL HCL 50 MG PO TABS: 50.0000 mg | ORAL_TABLET | Freq: Four times a day (QID) | ORAL | 0 refills | Status: DC | PRN

## 2018-05-30 NOTE — Progress Notes (Signed)
BP 129/88   Pulse 97   Temp 98.1 F (36.7 C) (Oral)   Wt 186 lb (84.4 kg)   SpO2 96%   BMI 28.28 kg/m    Subjective:    Patient ID: Elijah Taylor, male    DOB: 1951-05-21, 67 y.o.   MRN: [id number]  HPI: Elijah Taylor is a 67 y.o. male  Chief Complaint  Patient presents with  . Flank Pain    Left side goes around to back, acute. No BM since Sunday.   3 day hx of left flank pain, severe at first but eased off after about 5 hours. Now severe again today for several hours. Constant dull acheHas not had a BM for 4 days. Has had kidney stones twice in the past, always on the right. Feels like this may be consistent with what that was like. Denies fevers, N/V, hematuria, dysuria. Has not been taking anything OTC.    Relevant past medical, surgical, family and social history reviewed and updated as indicated. Interim medical history since our last visit reviewed. Allergies and medications reviewed and updated.  Review of Systems  Per HPI unless specifically indicated above     Objective:    BP 129/88   Pulse 97   Temp 98.1 F (36.7 C) (Oral)   Wt 186 lb (84.4 kg)   SpO2 96%   BMI 28.28 kg/m   Wt Readings from Last 3 Encounters:  05/30/18 186 lb (84.4 kg)  11/07/17 175 lb (79.4 kg)  10/30/17 182 lb 9 oz (82.8 kg)    Physical Exam  Constitutional: He is oriented to person, place, and time. He appears well-developed and well-nourished. He appears distressed (appears in pain, standing holding flank).  HENT:  Head: Atraumatic.  Eyes: Conjunctivae and EOM are normal.  Neck: Normal range of motion. Neck supple.  Cardiovascular: Normal rate, regular rhythm and normal heart sounds.  Pulmonary/Chest: Effort normal and breath sounds normal.  Abdominal: Soft. Bowel sounds are normal. He exhibits no distension and no mass. There is no tenderness. There is no rebound and no guarding.  Musculoskeletal: Normal range of motion. He exhibits no tenderness (No CVA tenderness  b/l).  Neurological: He is alert and oriented to person, place, and time.  Skin: Skin is warm and dry.  Psychiatric: He has a normal mood and affect. His behavior is normal.  Nursing note and vitals reviewed.   Results for orders placed or performed in visit on 05/30/18  Microscopic Examination  Result Value Ref Range   WBC, UA 0-5 0 - 5 /hpf   RBC, UA >30 (A) 0 - 2 /hpf   Epithelial Cells (non renal) CANCELED    Bacteria, UA None seen None seen/Few  CBC With Differential/Platelet  Result Value Ref Range   WBC 10.6 3.4 - 10.8 x10E3/uL   RBC 5.15 4.14 - 5.80 x10E6/uL   Hemoglobin 16.8 13.0 - 17.7 g/dL   Hematocrit 47.5 37.5 - 51.0 %   MCV 92 79 - 97 fL   MCH 32.6 26.6 - 33.0 pg   MCHC 35.4 31.5 - 35.7 g/dL   RDW 12.8 12.3 - 15.4 %   Platelets 275 150 - 450 x10E3/uL   Neutrophils 69 Not Estab. %   Lymphs 21 Not Estab. %   MID 10 Not Estab. %   Neutrophils Absolute 7.3 (H) 1.4 - 7.0 x10E3/uL   Lymphocytes Absolute 2.2 0.7 - 3.1 x10E3/uL   MID (Absolute) 1.1 0.1 - 1.6 X10E3/uL  UA/M w/rflx  Culture, Routine  Result Value Ref Range   Specific Gravity, UA 1.015 1.005 - 1.030   pH, UA 6.0 5.0 - 7.5   Color, UA Yellow Yellow   Appearance Ur Hazy (A) Clear   Leukocytes, UA Negative Negative   Protein, UA Negative Negative/Trace   Glucose, UA Negative Negative   Ketones, UA Negative Negative   RBC, UA 3+ (A) Negative   Bilirubin, UA Negative Negative   Urobilinogen, Ur 0.2 0.2 - 1.0 mg/dL   Nitrite, UA Negative Negative   Microscopic Examination See below:       Assessment & Plan:   Problem List Items Addressed This Visit    None    Visit Diagnoses    Flank pain    -  Primary   Relevant Medications   ketorolac (TORADOL) injection 60 mg (Completed)   Other Relevant Orders   CBC With Differential/Platelet (Completed)   UA/M w/rflx Culture, Routine (Completed)   DG Abd 2 Views (Completed)   Hematuria, unspecified type        Will obtain KUB to look for stones/stool  burden and colonic gas. CT if abnormal or unable to identify cause of pain. Hematuria on U/A and pain consistent with nephrolithiasis but will await imaging for confirmation and sizing if confirmed. Flomax, OTC pain relievers, and tramadol prn sent with sedation and addiction precautions.   Follow up plan: Return if symptoms worsen or fail to improve.

## 2018-05-31 ENCOUNTER — Telehealth: Payer: Self-pay | Admitting: Family Medicine

## 2018-05-31 ENCOUNTER — Encounter

## 2018-05-31 ENCOUNTER — Ambulatory Visit: Payer: Medicare Other | Admitting: Family Medicine

## 2018-05-31 DIAGNOSIS — N200 Calculus of kidney: Secondary | ICD-10-CM

## 2018-05-31 NOTE — Telephone Encounter (Signed)
Called pt to discuss x-ray results showing large stone. Urgent Urology referral placed for further management. Advised him to return call as soon as possible - please let me know if he calls back

## 2018-05-31 NOTE — Patient Instructions (Signed)
Follow up as needed

## 2018-05-31 NOTE — Telephone Encounter (Signed)
Pt returned call, discussed findings on x-ray and that referral folks would be in touch about his Urology appt. Will call next week and discuss follow up imaging for  depending on Urology management

## 2018-06-04 ENCOUNTER — Telehealth: Payer: Self-pay | Admitting: Urology

## 2018-06-04 ENCOUNTER — Ambulatory Visit (INDEPENDENT_AMBULATORY_CARE_PROVIDER_SITE_OTHER): Payer: Medicare Other | Admitting: Urology

## 2018-06-04 ENCOUNTER — Encounter: Payer: Self-pay | Admitting: Urology

## 2018-06-04 VITALS — BP 133/84 | HR 106 | Ht 69.0 in | Wt 182.3 lb

## 2018-06-04 DIAGNOSIS — R109 Unspecified abdominal pain: Secondary | ICD-10-CM

## 2018-06-04 DIAGNOSIS — K6289 Other specified diseases of anus and rectum: Secondary | ICD-10-CM | POA: Diagnosis not present

## 2018-06-04 DIAGNOSIS — R3129 Other microscopic hematuria: Secondary | ICD-10-CM

## 2018-06-04 DIAGNOSIS — Z8546 Personal history of malignant neoplasm of prostate: Secondary | ICD-10-CM | POA: Diagnosis not present

## 2018-06-04 DIAGNOSIS — Z87442 Personal history of urinary calculi: Secondary | ICD-10-CM

## 2018-06-04 LAB — URINALYSIS, COMPLETE
Bilirubin, UA: NEGATIVE
Glucose, UA: NEGATIVE
Ketones, UA: NEGATIVE
Leukocytes, UA: NEGATIVE
NITRITE UA: NEGATIVE
SPEC GRAV UA: 1.025 (ref 1.005–1.030)
Urobilinogen, Ur: 0.2 mg/dL (ref 0.2–1.0)
pH, UA: 5.5 (ref 5.0–7.5)

## 2018-06-04 LAB — MICROSCOPIC EXAMINATION: RBC, UA: >30 /hpf — ABNORMAL HIGH (ref 0–2)

## 2018-06-04 MED ORDER — OXYCODONE-ACETAMINOPHEN 10-325 MG PO TABS: 1.0000 | ORAL_TABLET | ORAL | 0 refills | Status: DC | PRN

## 2018-06-04 NOTE — Progress Notes (Signed)
06/04/2018 12:57 PM   Elijah Taylor 11-Sep-1950 [id number]  Referring provider: Guadalupe Maple, MD [address] [place], [place] [address]  Chief Complaint  Patient presents with  . Nephrolithiasis    left    HPI: Patient is a 67 year old Caucasian male with a history of prostate cancer and a history of nephrolithiasis who was referred by Volney American, PA-C for nephrolithiasis.    History of prostate cancer RRP performed in 2011.  Pathology unknown at this time.  History of nephrolithiasis Prior history of stones.  Patient presented to his primary care office on May 30, 2017 with a complaint of 3 days of left-sided flank pain that was continually worsening.  Abdominal x-rays noted 12 mm calculus overlies the expected location of the lower pole of the left kidney or proximal left ureter. A second 7 mm potential left ureteral calculus is seen at the level of L4 vertebral body. A third round 3 mm density in the left pelvis may represent a phleboliths.  There is a 18 mm hyperdense structure overlying the right pubic ramus with uncertain etiology. Surgical clips in the pelvis.    Labs with PCP:  UA >30RBC's.  WBC count 10.6.     Meds given by PCP: Toradol IM, Tramadol and Flomax   Current NSAID/anticoagulation:   ASA 81 mg daily   Today, he is experiencing urgency and incontinence.  He knows that a stone is present.  His pain 3/10 pain.  Patient denies any gross hematuria, dysuria or suprapubic/flank pain.  Patient denies any fevers, chills, nausea or vomiting.   UA > 30 RBC's and moderate bacteria.  He states the Tramadol is not helping the pain.    He is a patient of Dr. Letta Kocher, but he is concerned that his insurance would not pay since he was already here with [place].    PMH: Past Medical History:  Diagnosis Date  . Arthritis   . Chronic kidney disease    H/O KIDNEY STONES  . GERD (gastroesophageal reflux disease)   . Insomnia   . Prostate cancer ([place])   .  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 Wears glasses     Surgical History: Past Surgical History:  Procedure Laterality Date  . CARPAL TUNNEL RELEASE     left  . CARPAL TUNNEL RELEASE Right 12/01/2014   Procedure: RIGHT CARPAL TUNNEL RELEASE;  Surgeon: Daryll Brod, MD;  Location: [facility];  Service: Orthopedics;  Laterality: Right;  . CERVICAL FUSION  2007  . COLONOSCOPY    . COLONOSCOPY WITH PROPOFOL N/A 11/07/2017   Procedure: COLONOSCOPY WITH PROPOFOL;  Surgeon: [name], MD;  Location: [facility] ENDOSCOPY;  Service: Gastroenterology;  Laterality: N/A;  . CYSTOSCOPY MACROPLASTIQUE IMPLANT N/A 09/15/2015   Procedure: CYSTOSCOPY MACROPLASTIQUE IMPLANT;  Surgeon: Royston Cowper, MD;  Location: [facility] ORS;  Service: Urology;  Laterality: N/A;  . RETROPUBIC PROSTATECTOMY  2010  . SHOULDER ARTHROSCOPY W/ ROTATOR CUFF REPAIR     right and left  . TRIGGER FINGER RELEASE     x 4  . TRIGGER FINGER RELEASE Left 09/02/2014   Procedure: RELEASE A-1 PULLEY LEFT INDEX FINGER;  Surgeon: Daryll Brod, MD;  Location: [facility];  Service: Orthopedics;  Laterality: Left;  ANESTHESIA: IV REGIONAL FAB  . TRIGGER FINGER RELEASE Right 05/16/2017   Procedure: RELEASE TRIGGER FINGER/A-1 PULLEY RIGHT MIDDLE FINGER;  Surgeon: Daryll Brod, MD;  Location: [facility];  Service:  Orthopedics;  Laterality: Right;    Home Medications:  Allergies as of 06/04/2018   No Known Allergies     Medication List        Accurate as of 06/04/18 11:59 PM. Always use your most recent med list.          ALKA-SELTZER ANTACID PO Take 1 tablet by mouth as needed.   aspirin 81 MG tablet Take 81 mg by mouth daily.   multivitamin with minerals tablet Take 1 tablet by mouth daily.   oxyCODONE-acetaminophen 10-325 MG tablet Commonly known as:  PERCOCET Take 1 tablet by mouth every 4 (four) hours as needed for pain.   tamsulosin 0.4 MG Caps capsule Commonly known  as:  FLOMAX Take 1 capsule (0.4 mg total) by mouth daily.   traMADol 50 MG tablet Commonly known as:  ULTRAM Take 1 tablet (50 mg total) by mouth every 6 (six) hours as needed.   triazolam 0.25 MG tablet Commonly known as:  HALCION TAKE 1 TABLET BY MOUTH EVERYDAY AS NEEDED FOR INSOMNIA       Allergies: No Known Allergies  Family History: Family History  Problem Relation Age of Onset  . Cancer Mother        Brain Tumor  . Kidney failure Father   . Prostate cancer Father   . Heart disease Brother   . Heart attack Maternal Grandfather     Social History:  reports that he has never smoked. He has never used smokeless tobacco. He reports that he drinks about 1.0 standard drinks of alcohol per week. He reports that he does not use drugs.  ROS: UROLOGY Frequent Urination?: No Hard to postpone urination?: Yes Burning/pain with urination?: No Get up at night to urinate?: No Leakage of urine?: No Urine stream starts and stops?: Yes Trouble starting stream?: No Do you have to strain to urinate?: No Blood in urine?: Yes Urinary tract infection?: Yes(urine is dark and was told blood is in his urine) Sexually transmitted disease?: No Injury to kidneys or bladder?: No Painful intercourse?: No Weak stream?: No Erection problems?: Yes Penile pain?: No  Gastrointestinal Nausea?: Yes Vomiting?: No Indigestion/heartburn?: No Diarrhea?: No Constipation?: Yes  Constitutional Fever: No Night sweats?: No Weight loss?: No Fatigue?: No  Skin Skin rash/lesions?: No Itching?: No  Eyes Blurred vision?: No Double vision?: No  Ears/Nose/Throat Sore throat?: No Sinus problems?: Yes  Hematologic/Lymphatic Swollen glands?: No Easy bruising?: No  Cardiovascular Leg swelling?: No Chest pain?: No  Respiratory Cough?: No Shortness of breath?: No  Endocrine Excessive thirst?: No  Musculoskeletal Back pain?: Yes Joint pain?: No  Neurological Headaches?:  No Dizziness?: No  Psychologic Depression?: No Anxiety?: No  Physical Exam: BP 133/84 (BP Location: Left Arm, Patient Position: Sitting, Cuff Size: Normal)   Pulse (!) 106   Ht 5\' 9"  (1.753 m)   Wt 182 lb 4.8 oz (82.7 kg)   BMI 26.92 kg/m   Constitutional:  Well nourished. Alert and oriented, No acute distress. HEENT: [place] AT, moist mucus membranes.  Trachea midline, no masses. Cardiovascular: No clubbing, cyanosis, or edema. Respiratory: Normal respiratory effort, no increased work of breathing. GI: Abdomen is soft, non tender, non distended, no abdominal masses. Liver and spleen not palpable.  No hernias appreciated.  Stool sample for occult testing is not indicated.   GU: No CVA tenderness.  No bladder fullness or masses.  Patient with circumcised phallus.   Urethral meatus is patent.  No penile discharge. No penile lesions or rashes. Scrotum  without lesions, cysts, rashes and/or edema.  Testicles are located scrotally bilaterally. No masses are appreciated in the testicles. Left and right epididymis are normal. Rectal: Patient with  normal sphincter tone. Anus and perineum without scarring or rashes. No rectal masses are appreciated. Prostate is surgically absent.  A 3 cm x 1 cm rectal mass is appreciated.   Skin: No rashes, bruises or suspicious lesions. Lymph: No cervical or inguinal adenopathy. Neurologic: Grossly intact, no focal deficits, moving all 4 extremities. Psychiatric: Normal mood and affect.  Laboratory Data: Lab Results  Component Value Date   WBC 7.5 06/04/2018   HGB 15.0 06/04/2018   HCT 43.7 06/04/2018   MCV 91 06/04/2018   PLT 350 06/04/2018    Lab Results  Component Value Date   CREATININE 1.19 06/04/2018    No results found for: PSA  No results found for: TESTOSTERONE  No results found for: HGBA1C  Lab Results  Component Value Date   TSH 1.170 10/30/2017       Component Value Date/Time   CHOL 203 (H) 10/30/2017 1124   HDL 44 10/30/2017  1124   CHOLHDL 4.6 10/30/2017 1124   LDLCALC 138 (H) 10/30/2017 1124    Lab Results  Component Value Date   AST 19 10/30/2017   Lab Results  Component Value Date   ALT 21 10/30/2017   No components found for: ALKALINEPHOPHATASE No components found for: BILIRUBINTOTAL  No results found for: ESTRADIOL  Urinalysis > 30 RBC's and moderate bacteria.  See Epic.   I have reviewed the labs.   Pertinent Imaging: CLINICAL DATA:  Left flank pain for several days.  EXAM: ABDOMEN - 2 VIEW  COMPARISON:  None.  FINDINGS: The bowel gas pattern is normal. There is no evidence of free air. Large amount of formed stool throughout the colon.  12 mm calculus overlies the expected location of the lower pole of the left kidney or proximal left ureter. A second 7 mm potential left ureteral calculus is seen at the level of L4 vertebral body. A third round 3 mm density in the left pelvis may represent a phleboliths.  There is a 18 mm hyperdense structure overlying the right pubic ramus with uncertain etiology.  Surgical clips in the pelvis.  IMPRESSION: Suspected 12 mm calculus within the lower pole of the left kidney or proximal left ureter.  Suspected 7 mm left mid ureteral calculus at the level of L4 vertebral body.  18 mm hyperdense structure overlies the right renal pubic ramus, etiology uncertain. Attention on cross-sectional imaging to determine whether this represents a bone lesion or is within the rectum.   Electronically Signed   By: Fidela Salisbury M.D.   On: 05/31/2018 09:00  I have independently reviewed the films calcification in the mid left ureter suspicious for a stone and a density in the right pubic ramus   CLINICAL DATA:  Hematuria, flank pain, rectal mass  EXAM: CT ABDOMEN AND PELVIS WITHOUT AND WITH CONTRAST  TECHNIQUE: Multidetector CT imaging of the abdomen and pelvis was performed following the standard protocol before and  following the bolus administration of intravenous contrast.  CONTRAST:  129mL OMNIPAQUE IOHEXOL 300 MG/ML  SOLN  COMPARISON:  None.  FINDINGS: Lower chest: Lung bases are clear.  Hepatobiliary: Liver is within normal limits.  Gallbladder is underdistended but unremarkable. No intrahepatic or extrahepatic ductal dilatation.  Pancreas: Within normal limits.  Spleen: Within normal limits.  Adrenals/Urinary Tract: Adrenal glands within normal limits.  6.0 cm lateral  left lower pole renal cyst, benign (Bosniak I). No enhancing renal lesions.  Two nonobstructing right lower pole renal calculi measuring up to 5 mm (series 2/image 41).  Two left upper pole renal calculi measuring up to 12 mm (series 2/image 41).  5 mm distal left ureteral calculus (coronal image 55). No hydronephrosis.  On delayed imaging, there are no filling defects in the bilateral opacified proximal collecting systems, ureters, or bladder.  Stomach/Bowel: Stomach is within normal limits.  No evidence of bowel obstruction.  Normal appendix (series 4/image 87).  Left colonic diverticulosis, without evidence of diverticulitis.  No colonic wall thickening or mass is seen. Specifically, the rectum is unremarkable.  Vascular/Lymphatic: No evidence of abdominal aortic aneurysm.  Atherosclerotic calcifications of the abdominal aorta and branch vessels.  No suspicious abdominopelvic lymphadenopathy.  Reproductive: Status post prostatectomy.  2.1 x 1.6 cm enhancing soft tissue lesion at the vesicourethral anastomosis (series 4/image 96), worrisome for recurrence.  Prominent enhancement involving the base of the ventral penile shaft/corpus spongiosum (series 4/image 103), relatively symmetric, likely physiologic.  Other: No abdominopelvic ascites.  Musculoskeletal: 14 mm sclerotic lesion in the right parasymphyseal region (series 2/image 91).  Degenerative changes of the  visualized thoracolumbar spine.  IMPRESSION: 5 mm distal left ureteral calculus. Additional bilateral nonobstructing renal calculi measuring up to 12 mm in the left upper pole. No hydronephrosis.  Status post prostatectomy. 2.1 x 1.6 cm enhancing soft tissue lesion at the vesicourethral anastomosis, worrisome for recurrence. 14 mm sclerotic lesion in the right parasymphyseal region, suspicious for osseous metastasis. Correlate with PSA.  No colonic wall thickening or mass is evident on CT in this patient with history of rectal mass.  Additional ancillary findings as above.   Electronically Signed   By: Julian Hy M.D.   On: 06/05/2018 11:52    I have independently reviewed the films with Dr. Bernardo Heater and 5 mm stone is seen in left ureter and bilateral non obstructing renal calculi   Assessment & Plan:    1. Microscopic hematuria I explained to the patient that there are a number of causes that can be associated with blood in the urine, such as stones, BPH, UTI's, damage to the urinary tract and/or cancer - he has findings suspicious for stones on previous X-ray and palpable mass in rectum At this time, I felt that the patient warranted further urologic evaluation with 3 or greater RBC's/hpf on microscopic evaluation of the urine.  The AUA guidelines state that a CT urogram is the preferred imaging study to evaluate hematuria. I explained to the patient that a contrast material will be injected into a vein and that in rare instances, an allergic reaction can result and may even life threatening   The patient denies any allergies to contrast, iodine and/or seafood and is not taking metformin. Following the imaging study,  I've recommended a cystoscopy. I described how this is performed, typically in an office setting with a flexible cystoscope. We described the risks, benefits, and possible side effects, the most common of which is a minor amount of blood in the urine and/or  burning which usually resolves in 24 to 48 hours.   The patient had the opportunity to ask questions which were answered. Based upon this discussion, the patient is willing to proceed. Therefore, I've ordered: a CT Urogram and cystoscopy. The patient will return following all of the above for discussion of the results.  UA Urine culture BUN + creatinine    CTU results above  2. History of prostate cancer PSA undetectable   3. History of nephrolithiasis Findings on abdominal X-ray suspicious for stones  CTU reveals 5 mm left ureteral stone  Patient is advised that if they should start to experience pain that is not able to be controlled with pain medication, intractable nausea and/or vomiting and/or fevers greater than 103 or shaking chills to contact the office immediately or seek treatment in the emergency department for emergent intervention.      4. Flank pain Patient given Percocet 10/325, # 10, q 4 hours prn for pain [place] Controlled Substance consulted -no recent prescriptions filled -advised patient to take the medication and do not drive or operate dangerous medication, understanding that it is potentially habit-forming respiratory depressant and not to make any life changing decision while taking the medication  5. Rectal mass Palpable on rectal exam PSA undetectable Refer to oncology  Has cataract surgery on Friday.  Advised patient to notify the surgeon regarding his current situation.    Return for CTU report .  These notes generated with voice recognition software. I apologize for typographical errors.  Zara Council, PA-C  [facility] [address]  [place], [place] [address] [phone]

## 2018-06-05 ENCOUNTER — Ambulatory Visit
Admission: RE | Admit: 2018-06-05 | Discharge: 2018-06-05 | Disposition: A | Discharge status: Home / Self Care | Payer: Medicare Other | Source: Ambulatory Visit | Attending: Urology | Admitting: Urology

## 2018-06-05 DIAGNOSIS — Z9079 Acquired absence of other genital organ(s): Secondary | ICD-10-CM | POA: Insufficient documentation

## 2018-06-05 DIAGNOSIS — K6289 Other specified diseases of anus and rectum: Secondary | ICD-10-CM | POA: Insufficient documentation

## 2018-06-05 DIAGNOSIS — R109 Unspecified abdominal pain: Secondary | ICD-10-CM | POA: Insufficient documentation

## 2018-06-05 DIAGNOSIS — R3129 Other microscopic hematuria: Secondary | ICD-10-CM | POA: Insufficient documentation

## 2018-06-05 DIAGNOSIS — N202 Calculus of kidney with calculus of ureter: Secondary | ICD-10-CM | POA: Diagnosis not present

## 2018-06-05 DIAGNOSIS — N200 Calculus of kidney: Secondary | ICD-10-CM | POA: Diagnosis not present

## 2018-06-05 LAB — CBC WITH DIFFERENTIAL/PLATELET
BASOS: 1 %
Basophils Absolute: 0.1 10*3/uL (ref 0.0–0.2)
EOS (ABSOLUTE): 0.1 10*3/uL (ref 0.0–0.4)
Eos: 2 %
Hematocrit: 43.7 % (ref 37.5–51.0)
Hemoglobin: 15 g/dL (ref 13.0–17.7)
IMMATURE GRANULOCYTES: 0 %
Immature Grans (Abs): 0 10*3/uL (ref 0.0–0.1)
LYMPHS: 27 %
Lymphocytes Absolute: 2.1 10*3/uL (ref 0.7–3.1)
MCH: 31.1 pg (ref 26.6–33.0)
MCHC: 34.3 g/dL (ref 31.5–35.7)
MCV: 91 fL (ref 79–97)
MONOS ABS: 0.9 10*3/uL (ref 0.1–0.9)
Monocytes: 13 %
NEUTROS PCT: 57 %
Neutrophils Absolute: 4.3 10*3/uL (ref 1.4–7.0)
PLATELETS: 350 10*3/uL (ref 150–450)
RBC: 4.83 x10E6/uL (ref 4.14–5.80)
RDW: 12 % — AB (ref 12.3–15.4)
WBC: 7.5 10*3/uL (ref 3.4–10.8)

## 2018-06-05 LAB — BASIC METABOLIC PANEL
BUN/Creatinine Ratio: 15 (ref 10–24)
BUN: 18 mg/dL (ref 8–27)
CALCIUM: 9.6 mg/dL (ref 8.6–10.2)
CHLORIDE: 104 mmol/L (ref 96–106)
CO2: 24 mmol/L (ref 20–29)
Creatinine, Ser: 1.19 mg/dL (ref 0.76–1.27)
GFR calc Af Amer: 72 mL/min/{1.73_m2} (ref >59)
GFR calc non Af Amer: 62 mL/min/{1.73_m2} (ref >59)
GLUCOSE: 69 mg/dL (ref 65–99)
POTASSIUM: 4.4 mmol/L (ref 3.5–5.2)
SODIUM: 145 mmol/L — AB (ref 134–144)

## 2018-06-05 LAB — PSA: Prostate Specific Ag, Serum: <0.1 ng/mL (ref 0.0–4.0)

## 2018-06-05 MED ORDER — IOHEXOL 300 MG/ML  SOLN
150.0000 mL | Freq: Once | INTRAMUSCULAR | Status: AC | PRN
  Administered 2018-06-05: 125 mL via INTRAVENOUS

## 2018-06-06 LAB — CULTURE, URINE COMPREHENSIVE

## 2018-06-11 ENCOUNTER — Telehealth: Payer: Self-pay | Admitting: Urology

## 2018-06-11 ENCOUNTER — Encounter: Payer: Self-pay | Admitting: Internal Medicine

## 2018-06-11 ENCOUNTER — Inpatient Hospital Stay: Payer: Medicare Other | Attending: Internal Medicine | Admitting: Internal Medicine

## 2018-06-11 ENCOUNTER — Other Ambulatory Visit: Payer: Self-pay

## 2018-06-11 VITALS — BP 125/82 | HR 103 | Temp 96.7°F | Resp 18 | Ht 70.08 in | Wt 183.6 lb

## 2018-06-11 DIAGNOSIS — G40909 Epilepsy, unspecified, not intractable, without status epilepticus: Secondary | ICD-10-CM | POA: Diagnosis not present

## 2018-06-11 DIAGNOSIS — Z9079 Acquired absence of other genital organ(s): Secondary | ICD-10-CM | POA: Diagnosis not present

## 2018-06-11 DIAGNOSIS — C61 Malignant neoplasm of prostate: Secondary | ICD-10-CM | POA: Diagnosis not present

## 2018-06-11 DIAGNOSIS — N281 Cyst of kidney, acquired: Secondary | ICD-10-CM | POA: Diagnosis not present

## 2018-06-11 DIAGNOSIS — N202 Calculus of kidney with calculus of ureter: Secondary | ICD-10-CM | POA: Diagnosis not present

## 2018-06-11 DIAGNOSIS — Z9221 Personal history of antineoplastic chemotherapy: Secondary | ICD-10-CM

## 2018-06-11 DIAGNOSIS — D509 Iron deficiency anemia, unspecified: Secondary | ICD-10-CM | POA: Diagnosis not present

## 2018-06-11 DIAGNOSIS — K6289 Other specified diseases of anus and rectum: Secondary | ICD-10-CM

## 2018-06-11 DIAGNOSIS — Z9049 Acquired absence of other specified parts of digestive tract: Secondary | ICD-10-CM

## 2018-06-11 DIAGNOSIS — R161 Splenomegaly, not elsewhere classified: Secondary | ICD-10-CM

## 2018-06-11 DIAGNOSIS — Z85038 Personal history of other malignant neoplasm of large intestine: Secondary | ICD-10-CM | POA: Diagnosis not present

## 2018-06-11 DIAGNOSIS — Z7982 Long term (current) use of aspirin: Secondary | ICD-10-CM | POA: Insufficient documentation

## 2018-06-11 NOTE — Telephone Encounter (Signed)
Elijah Taylor was seen by Dr. Rogue Bussing today and he is suggesting the patient have a cystoscopy for further evaluation as the mass appears to be emanating from the vesicourethral anastomosis.   Recent PSA is undetectable.    He also needs an appointment next week for KUB for his kidney stone.

## 2018-06-11 NOTE — Telephone Encounter (Signed)
Spoke with Dr. Rogue Bussing regarding his visit with the patient today.  He is recommending a cystoscopy for further evaluation of the area seen on CT

## 2018-06-11 NOTE — Assessment & Plan Note (Addendum)
#    Prostate cancer- pT2c [2011]-no high risk features.  Status post prostatectomy.  Most recent PSA October 2019-less than 0.1.  Clinically no evidence of recurrence-however see discussion below.  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  Await cystoscopy as planned by urology.   #Right pubic rami bone lesion-benign versus malignant.  Await above work-up  #Renal stones/microscopic hematuria-defer to urology.  #Discussed with urology Natasha Mead.   Thank you Ms.McGowan for allowing me to participate in the care of your pleasant patient. Please do not hesitate to contact me with questions or concerns in the interim.  # Addendum: Reviewed cystoscopy-no evidence of any malignancy; cystoscopy urethral bulge not suspicious of malignancy; consistent with Macroplastique injection at the anastomosis.  Would not recommend further work-up.  Reviewed the imaging at the tumor conference.   #Addendum: Discussed with the patient the recommendations of the tumor conference.  With regards to sclerotic lesion in the pubic rami-recommend repeating x-rays in 6 months patient agreeable.  We will set up an appointment/x-rays prior.pt agrees with the plan.   # I reviewed the blood work- with the patient in detail; also reviewed the imaging independently [as summarized above]; and with the patient in detail.   # 60 minutes face-to-face with the patient discussing the above plan of care; more than 50% of time spent on prognosis/ natural history; counseling and coordination.  # pt- h- [phone]; cell- [phone].

## 2018-06-11 NOTE — Progress Notes (Signed)
Here for new pt evaluation.  

## 2018-06-11 NOTE — Progress Notes (Signed)
[facility] CONSULT NOTE  Patient Care Team: Guadalupe Maple, MD as PCP - General (Family Medicine)  CHIEF COMPLAINTS/PURPOSE OF CONSULTATION:  Rectal mass  #  Oncology History   # 2012- Prostate cancer- pT2c [2011]-no high risk features [Gleason score].  Status post prostatectomy.  Most recent PSA October 2019-less than 0.1. [Dr.Wolfe];   # OCT 2019- UV junction mass consistent with macroplastique injection for urinary incontinence  # right Pubic rami- bone lesion -? Etiology- bone island vs sclerotic met [clinically less likley]     Prostate cancer ([facility])   10/06/2016 Initial Diagnosis    Prostate cancer ([place])      HISTORY OF PRESENTING ILLNESS:  Elijah Taylor 67 y.o.  male with prior history of prostate cancer status post surgery about 8 years ago [as summarized above]; had a recent work-up with PCP for left-sided sharp flank pain.  As part of further work up, patient was found to have microscopic hematuria.  Patient was subsequently referred to urology for further work-up -and on rectal exam noted to have a  " rectal mass". Patient had a CT urogram with urology that showed-bilateral kidney stones/left ureteral stone; also approximately 2.5 cm soft tissue mass at the junction of the urethra/bladder neck.  No mastoid rectum.  Denies any pain anywhere else.  No nausea no vomiting.  No weight loss.  No other bone pain.  Patient also complaining of rectal pain for many years/about 15 to 30 minutes; happens every 6 to 12 months.  He had a colonoscopy in April 2019 which was fairly unremarkable.  Review of Systems  Constitutional: Negative for chills, diaphoresis, fever, malaise/fatigue and weight loss.  HENT: Negative for nosebleeds and sore throat.   Eyes: Negative for double vision.  Respiratory: Negative for cough, hemoptysis, sputum production, shortness of breath and wheezing.   Cardiovascular: Negative for chest pain, palpitations, orthopnea and leg  swelling.  Gastrointestinal: Positive for abdominal pain. Negative for blood in stool, constipation, diarrhea, heartburn, melena, nausea and vomiting.  Genitourinary: Positive for flank pain. Negative for dysuria, frequency and urgency.  Musculoskeletal: Negative for back pain and joint pain.  Skin: Negative.  Negative for itching and rash.  Neurological: Negative for dizziness, tingling, focal weakness, weakness and headaches.  Endo/Heme/Allergies: Does not bruise/bleed easily.  Psychiatric/Behavioral: Negative for depression. The patient is not nervous/anxious and does not have insomnia.      MEDICAL HISTORY:  Past Medical History:  Diagnosis Date  . Arthritis   . Chronic kidney disease    H/O KIDNEY STONES  . GERD (gastroesophageal reflux disease)   . Insomnia   . Prostate cancer ([place])   . 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 Wears glasses     SURGICAL HISTORY: Past Surgical History:  Procedure Laterality Date  . CARPAL TUNNEL RELEASE     left  . CARPAL TUNNEL RELEASE Right 12/01/2014   Procedure: RIGHT CARPAL TUNNEL RELEASE;  Surgeon: Daryll Brod, MD;  Location: [facility];  Service: Orthopedics;  Laterality: Right;  . CERVICAL FUSION  2007  . COLONOSCOPY    . COLONOSCOPY WITH PROPOFOL N/A 11/07/2017   Procedure: COLONOSCOPY WITH PROPOFOL;  Surgeon: [name], MD;  Location: [facility] ENDOSCOPY;  Service: Gastroenterology;  Laterality: N/A;  . CYSTOSCOPY MACROPLASTIQUE IMPLANT N/A 09/15/2015   Procedure: CYSTOSCOPY MACROPLASTIQUE IMPLANT;  Surgeon: Royston Cowper, MD;  Location: [facility];  Service: Urology;  Laterality: N/A;  . RETROPUBIC PROSTATECTOMY  2010  . SHOULDER  ARTHROSCOPY W/ ROTATOR CUFF REPAIR     right and left  . TRIGGER FINGER RELEASE     x 4  . TRIGGER FINGER RELEASE Left 09/02/2014   Procedure: RELEASE A-1 PULLEY LEFT INDEX FINGER;  Surgeon: Daryll Brod, MD;  Location: [facility];  Service: Orthopedics;   Laterality: Left;  ANESTHESIA: IV REGIONAL FAB  . TRIGGER FINGER RELEASE Right 05/16/2017   Procedure: RELEASE TRIGGER FINGER/A-1 PULLEY RIGHT MIDDLE FINGER;  Surgeon: Daryll Brod, MD;  Location: [facility];  Service: Orthopedics;  Laterality: Right;    SOCIAL HISTORY: Social History   Socioeconomic History  . Marital status: Married    Spouse name: Not on file  . Number of children: 2  . Years of education: Not on file  . Highest education level: Not on file  Occupational History  . Not on file  Social Needs  . Financial resource strain: Not on file  . Food insecurity:    Worry: Not on file    Inability: Not on file  . Transportation needs:    Medical: Not on file    Non-medical: Not on file  Tobacco Use  . Smoking status: Never Smoker  . Smokeless tobacco: Never Used  Substance and Sexual Activity  . Alcohol use: Yes    Alcohol/week: 1.0 standard drinks    Types: 1 Glasses of wine per week    Comment: occasionally  . Drug use: No  . Sexual activity: Not Currently  Lifestyle  . Physical activity:    Days per week: Not on file    Minutes per session: Not on file  . Stress: Not on file  Relationships  . Social connections:    Talks on phone: Not on file    Gets together: Not on file    Attends religious service: Not on file    Active member of club or organization: Not on file    Attends meetings of clubs or organizations: Not on file    Relationship status: Not on file  . Intimate partner violence:    Fear of current or ex partner: Not on file    Emotionally abused: Not on file    Physically abused: Not on file    Forced sexual activity: Not on file  Other Topics Concern  . Not on file  Social History Narrative   Lives with wife; in [place]; retd. mechanic maintenance; no smoking; on alcohol     FAMILY HISTORY: Family History  Problem Relation Age of Onset  . Cancer Mother        Brain Tumor  . Kidney failure Father   . Prostate cancer  Father   . Heart disease Brother   . Heart attack Maternal Grandfather   . Prostate cancer Maternal Uncle   . Prostate cancer Maternal Uncle     ALLERGIES:  has No Known Allergies.  MEDICATIONS:  Current Outpatient Medications  Medication Sig Dispense Refill  . aspirin 81 MG tablet Take 81 mg by mouth daily.    . Multiple Vitamins-Minerals (MULTIVITAMIN WITH MINERALS) tablet Take 1 tablet by mouth daily.    Marland Kitchen oxyCODONE-acetaminophen (PERCOCET) 10-325 MG tablet Take 1 tablet by mouth every 4 (four) hours as needed for pain. 10 tablet 0  . tamsulosin (FLOMAX) 0.4 MG CAPS capsule Take 1 capsule (0.4 mg total) by mouth daily. 30 capsule 0  . Calcium Carbonate Antacid (ALKA-SELTZER ANTACID PO) Take 1 tablet by mouth as needed.    . diphenhydrAMINE (BENADRYL) 25  mg capsule Take 25 mg by mouth every 6 (six) hours as needed.    Marland Kitchen HYDROcodone-acetaminophen (NORCO) 5-325 MG tablet Take 1 tablet by mouth every 6 (six) hours as needed for moderate pain. 15 tablet 0  . traMADol (ULTRAM) 50 MG tablet Take 1 tablet (50 mg total) by mouth every 6 (six) hours as needed. (Patient not taking: Reported on 06/04/2018) 20 tablet 0  . triazolam (HALCION) 0.25 MG tablet TAKE 1 TABLET BY MOUTH EVERYDAY AS NEEDED FOR INSOMNIA  5   No current facility-administered medications for this visit.       Marland Kitchen  PHYSICAL EXAMINATION: ECOG PERFORMANCE STATUS: 0 - Asymptomatic  Vitals:   06/11/18 1502  BP: 125/82  Pulse: (!) 103  Resp: 18  Temp: (!) 96.7 F (35.9 C)   Filed Weights   06/11/18 1502  Weight: 183 lb 9.6 oz (83.3 kg)    Physical Exam  Constitutional: He is oriented to person, place, and time and well-developed, well-nourished, and in no distress.  HENT:  Head: Normocephalic and atraumatic.  Mouth/Throat: Oropharynx is clear and moist. No oropharyngeal exudate.  Eyes: Pupils are equal, round, and reactive to light.  Neck: Normal range of motion. Neck supple.  Cardiovascular: Normal rate and  regular rhythm.  Pulmonary/Chest: No respiratory distress. He has no wheezes.  Abdominal: Soft. Bowel sounds are normal. He exhibits no distension and no mass. There is no tenderness. There is no rebound and no guarding.  Musculoskeletal: Normal range of motion. He exhibits no edema or tenderness.  Neurological: He is alert and oriented to person, place, and time.  Skin: Skin is warm.  Psychiatric: Affect normal.     LABORATORY DATA:  I have reviewed the data as listed Lab Results  Component Value Date   WBC 7.5 06/04/2018   HGB 15.0 06/04/2018   HCT 43.7 06/04/2018   MCV 91 06/04/2018   PLT 350 06/04/2018   Recent Labs    10/30/17 1124 06/04/18 1351  NA 143 145*  K 4.6 4.4  CL 103 104  CO2 27 24  GLUCOSE 91 69  BUN 16 18  CREATININE 1.09 1.19  CALCIUM 9.9 9.6  GFRNONAA 69 62  GFRAA 80 72  PROT 7.2  --   ALBUMIN 4.4  --   AST 19  --   ALT 21  --   ALKPHOS 59  --   BILITOT 0.5  --     RADIOGRAPHIC STUDIES: I have personally reviewed the radiological images as listed and agreed with the findings in the report. Abdomen 1 View (kub)  Result Date: 06/12/2018 CLINICAL DATA:  Follow bilateral renal calculi. Patient reports rectal or bladder mass and history of prostate cancer. EXAM: ABDOMEN - 1 VIEW COMPARISON:  Radiographs 05/30/2018.  CT 06/05/2018. FINDINGS: Bilateral renal calculi are grossly stable, measuring up to 9 mm in the upper pole of the left kidney. The distal left ureteral calculus seen on the recent CT is likely unchanged in location, measuring 5 mm and overlapping the left sacrum. The visualized bowel gas pattern is normal. There are surgical clips from previous prostatectomy. Sclerotic lesion again noted in the right parasymphyseal region. There are degenerative changes within the lower lumbar spine. IMPRESSION: 1. No apparent change in position of bilateral renal and distal left ureteral calculi. 2. Indeterminate sclerotic lesion again noted in the right  parasymphyseal region. Electronically Signed   By: Richardean Sale M.D.   On: 06/12/2018 16:23   Dg Abd 2 Views  Result  Date: 05/31/2018 CLINICAL DATA:  Left flank pain for several days. EXAM: ABDOMEN - 2 VIEW COMPARISON:  None. FINDINGS: The bowel gas pattern is normal. There is no evidence of free air. Large amount of formed stool throughout the colon. 12 mm calculus overlies the expected location of the lower pole of the left kidney or proximal left ureter. A second 7 mm potential left ureteral calculus is seen at the level of L4 vertebral body. A third round 3 mm density in the left pelvis may represent a phleboliths. There is a 18 mm hyperdense structure overlying the right pubic ramus with uncertain etiology. Surgical clips in the pelvis. IMPRESSION: Suspected 12 mm calculus within the lower pole of the left kidney or proximal left ureter. Suspected 7 mm left mid ureteral calculus at the level of L4 vertebral body. 18 mm hyperdense structure overlies the right renal pubic ramus, etiology uncertain. Attention on cross-sectional imaging to determine whether this represents a bone lesion or is within the rectum. Electronically Signed   By: Fidela Salisbury M.D.   On: 05/31/2018 09:00   Ct Hematuria Workup  Result Date: 06/05/2018 CLINICAL DATA:  Hematuria, flank pain, rectal mass EXAM: CT ABDOMEN AND PELVIS WITHOUT AND WITH CONTRAST TECHNIQUE: Multidetector CT imaging of the abdomen and pelvis was performed following the standard protocol before and following the bolus administration of intravenous contrast. CONTRAST:  154m OMNIPAQUE IOHEXOL 300 MG/ML  SOLN COMPARISON:  None. FINDINGS: Lower chest: Lung bases are clear. Hepatobiliary: Liver is within normal limits. Gallbladder is underdistended but unremarkable. No intrahepatic or extrahepatic ductal dilatation. Pancreas: Within normal limits. Spleen: Within normal limits. Adrenals/Urinary Tract: Adrenal glands within normal limits. 6.0 cm lateral  left lower pole renal cyst, benign (Bosniak I). No enhancing renal lesions. Two nonobstructing right lower pole renal calculi measuring up to 5 mm (series 2/image 41). Two left upper pole renal calculi measuring up to 12 mm (series 2/image 41). 5 mm distal left ureteral calculus (coronal image 55). No hydronephrosis. On delayed imaging, there are no filling defects in the bilateral opacified proximal collecting systems, ureters, or bladder. Stomach/Bowel: Stomach is within normal limits. No evidence of bowel obstruction. Normal appendix (series 4/image 87). Left colonic diverticulosis, without evidence of diverticulitis. No colonic wall thickening or mass is seen. Specifically, the rectum is unremarkable. Vascular/Lymphatic: No evidence of abdominal aortic aneurysm. Atherosclerotic calcifications of the abdominal aorta and branch vessels. No suspicious abdominopelvic lymphadenopathy. Reproductive: Status post prostatectomy. 2.1 x 1.6 cm enhancing soft tissue lesion at the vesicourethral anastomosis (series 4/image 96), worrisome for recurrence. Prominent enhancement involving the base of the ventral penile shaft/corpus spongiosum (series 4/image 103), relatively symmetric, likely physiologic. Other: No abdominopelvic ascites. Musculoskeletal: 14 mm sclerotic lesion in the right parasymphyseal region (series 2/image 91). Degenerative changes of the visualized thoracolumbar spine. IMPRESSION: 5 mm distal left ureteral calculus. Additional bilateral nonobstructing renal calculi measuring up to 12 mm in the left upper pole. No hydronephrosis. Status post prostatectomy. 2.1 x 1.6 cm enhancing soft tissue lesion at the vesicourethral anastomosis, worrisome for recurrence. 14 mm sclerotic lesion in the right parasymphyseal region, suspicious for osseous metastasis. Correlate with PSA. No colonic wall thickening or mass is evident on CT in this patient with history of rectal mass. Additional ancillary findings as above.  Electronically Signed   By: SJulian HyM.D.   On: 06/05/2018 11:52    ASSESSMENT & PLAN:   Prostate cancer ([facility] #  Prostate cancer- pT2c [2011]-no high risk features.  Status post prostatectomy.  Most recent PSA October 2019-less than 0.1.  Clinically no evidence of recurrence-however see discussion below.  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  Await cystoscopy as planned by urology.   #Right pubic rami bone lesion-benign versus malignant.  Await above work-up  #Renal stones/microscopic hematuria-defer to urology.  #Discussed with urology Natasha Mead.   Thank you Ms.McGowan for allowing me to participate in the care of your pleasant patient. Please do not hesitate to contact me with questions or concerns in the interim.  # Addendum: Reviewed cystoscopy-no evidence of any malignancy; cystoscopy urethral bulge not suspicious of malignancy; consistent with Macroplastique injection at the anastomosis.  Would not recommend further work-up.  Reviewed the imaging at the tumor conference.   #Addendum: Discussed with the patient the recommendations of the tumor conference.  With regards to sclerotic lesion in the pubic rami-recommend repeating x-rays in 6 months patient agreeable.  We will set up an appointment/x-rays prior.pt agrees with the plan.   # I reviewed the blood work- with the patient in detail; also reviewed the imaging independently [as summarized above]; and with the patient in detail.   # 60 minutes face-to-face with the patient discussing the above plan of care; more than 50% of time spent on prognosis/ natural history; counseling and coordination.  # pt- h- [phone]; cell- [phone].    All questions were answered. The patient knows to call the clinic with any problems, questions or concerns.    Cammie Sickle, MD 06/15/2018 8:51 AM

## 2018-06-12 ENCOUNTER — Ambulatory Visit
Admission: RE | Admit: 2018-06-12 | Discharge: 2018-06-12 | Disposition: A | Discharge status: Home / Self Care | Payer: Medicare Other | Source: Ambulatory Visit | Attending: Urology | Admitting: Urology

## 2018-06-12 ENCOUNTER — Other Ambulatory Visit: Payer: Self-pay

## 2018-06-12 ENCOUNTER — Other Ambulatory Visit: Payer: Self-pay | Admitting: Family Medicine

## 2018-06-12 DIAGNOSIS — N202 Calculus of kidney with calculus of ureter: Secondary | ICD-10-CM | POA: Diagnosis not present

## 2018-06-12 DIAGNOSIS — N200 Calculus of kidney: Secondary | ICD-10-CM | POA: Diagnosis not present

## 2018-06-12 DIAGNOSIS — Z87442 Personal history of urinary calculi: Secondary | ICD-10-CM

## 2018-06-12 DIAGNOSIS — M899 Disorder of bone, unspecified: Secondary | ICD-10-CM | POA: Insufficient documentation

## 2018-06-12 NOTE — Telephone Encounter (Signed)
Ok I will make the app for the cysto but I need an order for the [place]

## 2018-06-13 ENCOUNTER — Ambulatory Visit (INDEPENDENT_AMBULATORY_CARE_PROVIDER_SITE_OTHER): Payer: Medicare Other | Admitting: Urology

## 2018-06-13 ENCOUNTER — Encounter: Payer: Self-pay | Admitting: Urology

## 2018-06-13 ENCOUNTER — Other Ambulatory Visit: Payer: Self-pay

## 2018-06-13 VITALS — BP 117/71 | HR 83 | Ht 70.0 in | Wt 182.4 lb

## 2018-06-13 DIAGNOSIS — R319 Hematuria, unspecified: Secondary | ICD-10-CM | POA: Diagnosis not present

## 2018-06-13 DIAGNOSIS — R896 Abnormal cytological findings in specimens from other organs, systems and tissues: Secondary | ICD-10-CM | POA: Diagnosis not present

## 2018-06-13 DIAGNOSIS — R3129 Other microscopic hematuria: Secondary | ICD-10-CM | POA: Diagnosis not present

## 2018-06-13 DIAGNOSIS — N201 Calculus of ureter: Secondary | ICD-10-CM

## 2018-06-13 LAB — URINALYSIS, COMPLETE
Bilirubin, UA: NEGATIVE
Glucose, UA: NEGATIVE
Ketones, UA: NEGATIVE
Leukocytes, UA: NEGATIVE
NITRITE UA: NEGATIVE
PH UA: 7 (ref 5.0–7.5)
Protein, UA: NEGATIVE
Specific Gravity, UA: 1.02 (ref 1.005–1.030)
UUROB: 0.2 mg/dL (ref 0.2–1.0)

## 2018-06-13 LAB — MICROSCOPIC EXAMINATION
EPITHELIAL CELLS (NON RENAL): NONE SEEN /HPF (ref 0–10)
WBC UA: NONE SEEN /HPF (ref 0–5)

## 2018-06-13 MED ORDER — HYDROCODONE-ACETAMINOPHEN 5-325 MG PO TABS: 1.0000 | ORAL_TABLET | Freq: Four times a day (QID) | ORAL | 0 refills | Status: DC | PRN

## 2018-06-13 NOTE — Progress Notes (Signed)
Cystoscopy Procedure Note:  Indication: History of open prostatectomy, pathology unknown, possible recurrence at vesicourethral junction  Elijah Taylor is a 67 year old male with a interesting urologic history.  He was previously followed by Dr. Eliberto Ivory.  He underwent open radical prostatectomy in 2011, and fortunately the pathology is unavailable to me today.  Patient is also unsure what his Gleason score or stage was.  PSA has been undetectable since that time, and was last checked 06/04/2018.  He also underwent Macroplastique injection x2 with Dr. Eliberto Ivory for stress incontinence in 2014 and 2017.  He was also seen by our PA Zara Council.  She ordered a CT urogram in the setting of hematuria as well as some flank pain.  I personally reviewed the CT urogram dated 06/05/2018.  There is a 5 mm left distal ureteral stone, with 1.2 cm renal stone on the left.  There is a 2 cm soft tissue lesion at the vesicourethral anastomosis.  This does not enhance on my review of the films.  This is likely most consistent with prior Macroplastique injection.  After informed consent and discussion of the procedure and its risks, Aul H Mauriello was positioned and prepped in the standard fashion. Cystoscopy was performed with a flexible cystoscope. The urethra, bladder neck and entire bladder was visualized in a standard fashion. The prostate was surgically absent.  There was a slight bulge consistent with Macroplastique injection at the anastomosis consistent with the CT findings discussed above.  There are no papillary or irregular lesions worrisome for bladder cancer or prostate cancer recurrence.  The ureteral orifices were visualized in their normal location and orientation.  Bladder mucosa was grossly normal throughout.  Flexion demonstrated no concerning findings at the bladder neck  Findings: Surgically absent prostate No concerning findings for recurrence, findings consistent with macroplastique  injection Schedule left ureteroscopy, laser lithotripsy, stent placement   Assessment and Plan: Defer any further work-up of indeterminate sclerotic bone lesion to PCP, very low suspicion for PCa recurrence Obtain outside prostatectomy pathology  We discussed various treatment options for urolithiasis including observation with or without medical expulsive therapy, shockwave lithotripsy (SWL), ureteroscopy and laser lithotripsy with stent placement, and percutaneous nephrolithotomy.  We discussed that management is based on stone size, location, density, patient co-morbidities, and patient preference.   Stones <3mm in size have a >80% spontaneous passage rate. Data surrounding the use of tamsulosin for medical expulsive therapy is controversial, but meta analyses suggests it is most efficacious for distal stones between 5-9mm in size. Possible side effects include dizziness/lightheadedness, and retrograde ejaculation.  SWL has a lower stone free rate in a single procedure, but also a lower complication rate compared to ureteroscopy and avoids a stent and associated stent related symptoms. Possible complications include renal hematoma, steinstrasse, and need for additional treatment.  Ureteroscopy with laser lithotripsy and stent placement has a higher stone free rate than SWL in a single procedure, however increased complication rate including possible infection, ureteral injury, bleeding, and stent related morbidity. Common stent related symptoms include dysuria, urgency/frequency, and flank pain.  After an extensive discussion of the risks and benefits of the above treatment options, the patient would like to proceed with L URS/LL/Stent  Nickolas Madrid, MD 06/13/2018

## 2018-06-14 ENCOUNTER — Telehealth: Payer: Self-pay | Admitting: Radiology

## 2018-06-14 ENCOUNTER — Other Ambulatory Visit: Payer: Medicare Other

## 2018-06-14 ENCOUNTER — Other Ambulatory Visit: Payer: Self-pay | Admitting: Radiology

## 2018-06-14 DIAGNOSIS — N201 Calculus of ureter: Secondary | ICD-10-CM

## 2018-06-14 NOTE — Progress Notes (Signed)
Tumor Board Documentation  JERROL HELMERS was presented by Dr Rogue Bussing at our Tumor Board on 06/14/2018, which included representatives from medical oncology, radiation oncology, surgical, radiology, pathology, navigation, internal medicine, pharmacy, research, pulmonology.  Azarion currently presents as a new patient with history of the following treatments: surgical intervention(s).  Additionally, we reviewed previous medical and familial history, history of present illness, and recent lab results along with all available histopathologic and imaging studies. The tumor board considered available treatment options and made the following recommendations: Active surveillance    The following procedures/referrals were also placed: No orders of the defined types were placed in this encounter.   Clinical Trial Status: not eligible   Staging used: AJCC Stage Group  National site-specific guidelines NCCN were discussed with respect to the case.  Tumor board is a meeting of clinicians from various specialty areas who evaluate and discuss patients for whom a multidisciplinary approach is being considered. Final determinations in the plan of care are those of the provider(s). The responsibility for follow up of recommendations given during tumor board is that of the provider.   Today's extended care, comprehensive team conference, [place] was not present for the discussion and was not examined.   Multidisciplinary Tumor Board is a multidisciplinary case peer review process.  Decisions discussed in the Multidisciplinary Tumor Board reflect the opinions of the specialists present at the conference without having examined the patient.  Ultimately, treatment and diagnostic decisions rest with the primary provider(s) and the patient.

## 2018-06-14 NOTE — Telephone Encounter (Signed)
Patient was given the [facility] Information form below as well as the Instructions for Pre-Admission Testing form & a map of [facility].   [address], [address], [place] [address] Telephone: [phone] Fax: [phone]   Thank you for choosing [facility] for your upcoming surgery!  We are always here to assist in your urological needs.  Please read the following information with specific details for your upcoming appointments related to your surgery. Please contact Tephanie Escorcia at [phone] Option 3 with any questions.  The Name of Your Surgery: Left ureteroscopy, laser lithotripsy, left ureteral stent placement Your Surgery Date: 06/29/2018 Your Surgeon: Nickolas Madrid  Please call Same Day Surgery at [phone] between the hours of 1pm-3pm one day prior to your surgery. They will inform you of the time to arrive at Same Day Surgery which is located on the second floor of the [facility].   Please refer to the attached letter regarding instructions for Pre-Admission Testing. You will receive a call from the [place] office regarding your appointment with them.  The Pre-Admission Testing office is located at [place], on the first floor of the [facility] [facility] in [place] (office is to the right as you enter through the [employer] of the [facility]). Please have all medications you are currently taking and your insurance card available.   Patient was advised to have nothing to eat or drink after midnight the night prior to surgery except that he may have only water until 2 hours before surgery with nothing to drink within 2 hours of surgery.  The patient states he currently takes aspirin 81mg  & was informed to continue aspirin per Dr Diamantina Providence. Patient's questions were answered and he expressed understanding of  these instructions.

## 2018-06-15 ENCOUNTER — Telehealth: Payer: Self-pay | Admitting: Internal Medicine

## 2018-06-15 DIAGNOSIS — M899 Disorder of bone, unspecified: Secondary | ICD-10-CM

## 2018-06-15 NOTE — Telephone Encounter (Signed)
Please schedule an appointment for the patient to follow-up with me in 6 months/x-rays [ordered] few days before.

## 2018-06-18 ENCOUNTER — Other Ambulatory Visit: Payer: Self-pay

## 2018-06-18 ENCOUNTER — Encounter
Admission: RE | Admit: 2018-06-18 | Discharge: 2018-06-18 | Disposition: A | Discharge status: Home / Self Care | Payer: Medicare Other | Source: Ambulatory Visit | Attending: Urology | Admitting: Urology

## 2018-06-18 ENCOUNTER — Other Ambulatory Visit: Payer: Self-pay | Admitting: Radiology

## 2018-06-18 DIAGNOSIS — I10 Essential (primary) hypertension: Secondary | ICD-10-CM | POA: Diagnosis not present

## 2018-06-18 DIAGNOSIS — Z01818 Encounter for other preprocedural examination: Secondary | ICD-10-CM | POA: Diagnosis not present

## 2018-06-18 HISTORY — DX: Personal history of urinary calculi: Z87.442

## 2018-06-18 LAB — URINALYSIS, ROUTINE W REFLEX MICROSCOPIC
BILIRUBIN URINE: NEGATIVE
GLUCOSE, UA: NEGATIVE mg/dL
HGB URINE DIPSTICK: NEGATIVE
Ketones, ur: NEGATIVE mg/dL
Leukocytes, UA: NEGATIVE
Nitrite: NEGATIVE
PH: 5 (ref 5.0–8.0)
Protein, ur: NEGATIVE mg/dL
Specific Gravity, Urine: 1.019 (ref 1.005–1.030)

## 2018-06-18 NOTE — Patient Instructions (Signed)
Your procedure is scheduled on: Friday 06/29/18 Report to [place]. To find out your arrival time please call [phone] between 1PM - 3PM on Thursday 06/28/18.  Remember: Instructions that are not followed completely may result in serious medical risk, up to and including death, or upon the discretion of your surgeon and anesthesiologist your surgery may need to be rescheduled.     _X__ 1. Do not eat food after midnight the night before your procedure.                 No gum chewing or hard candies. You may drink clear liquids up to 2 hours                 before you are scheduled to arrive for your surgery- DO not drink clear                 liquids within 2 hours of the start of your surgery.                 Clear Liquids include:  water, apple juice without pulp, clear carbohydrate                 drink such as Clearfast or Gatorade, Black Coffee or Tea (Do not add                 anything to coffee or tea).  __X__2.  On the morning of surgery brush your teeth with toothpaste and water, you                 may rinse your mouth with mouthwash if you wish.  Do not swallow any              toothpaste of mouthwash.     _X__ 3.  No Alcohol for 24 hours before or after surgery.   _X__ 4.  Do Not Smoke or use e-cigarettes For 24 Hours Prior to Your Surgery.                 Do not use any chewable tobacco products for at least 6 hours prior to                 surgery.  ____  5.  Bring all medications with you on the day of surgery if instructed.   __X__  6.  Notify your doctor if there is any change in your medical condition      (cold, fever, infections).     Do not wear jewelry, make-up, hairpins, clips or nail polish. Do not wear lotions, powders, or perfumes.  Do not shave 48 hours prior to surgery. Men may shave face and neck. Do not bring valuables to the hospital.    [facility] is not responsible for any belongings or  valuables.  Contacts, dentures/partials or body piercings may not be worn into surgery. Bring a case for your contacts, glasses or hearing aids, a denture cup will be supplied. Leave your suitcase in the car. After surgery it may be brought to your room. For patients admitted to the hospital, discharge time is determined by your treatment team.   Patients discharged the day of surgery will not be allowed to drive home.   Please read over the following fact sheets that you were given:   MRSA Information  __X__ Take these medicines the morning of surgery with A SIP OF WATER:  1. none  2.   3.   4.  5.  6.  ____ Fleet Enema (as directed)   ____ Use CHG Soap/SAGE wipes as directed  ____ Use inhalers on the day of surgery  ____ Stop metformin/Janumet/Farxiga 2 days prior to surgery    ____ Take 1/2 of usual insulin dose the night before surgery. No insulin the morning          of surgery.   ____ Stop Blood Thinners Coumadin/Plavix/Xarelto/Pleta/Pradaxa/Eliquis/Effient/Aspirin  on   Or contact your Surgeon, Cardiologist or Medical Doctor regarding  ability to stop your blood thinners  __X__ Stop Anti-inflammatories 7 days before surgery such as Advil, Ibuprofen, Motrin,  BC or Goodies Powder, Naprosyn, Naproxen, Aleve, Aspirin, Alka Seltzer    __X__ Stop all herbal supplements, fish oil or vitamin E until after surgery.    ____ Bring C-Pap to the hospital.

## 2018-06-19 LAB — URINE CULTURE: Culture: NO GROWTH

## 2018-06-19 NOTE — Pre-Procedure Instructions (Signed)
EKG OK BY DR Amie Critchley

## 2018-06-21 ENCOUNTER — Other Ambulatory Visit: Payer: Self-pay | Admitting: Family Medicine

## 2018-06-22 DIAGNOSIS — H2512 Age-related nuclear cataract, left eye: Secondary | ICD-10-CM | POA: Diagnosis not present

## 2018-06-22 DIAGNOSIS — H2511 Age-related nuclear cataract, right eye: Secondary | ICD-10-CM | POA: Diagnosis not present

## 2018-06-22 DIAGNOSIS — H2513 Age-related nuclear cataract, bilateral: Secondary | ICD-10-CM | POA: Diagnosis not present

## 2018-06-23 ENCOUNTER — Other Ambulatory Visit: Payer: Self-pay | Admitting: Family Medicine

## 2018-06-25 ENCOUNTER — Other Ambulatory Visit: Payer: Self-pay

## 2018-06-25 NOTE — Telephone Encounter (Signed)
Requested medication (s) are due for refill today: no  Requested medication (s) are on the active medication list: yes  Last refill:  06/21/18 for 30 and 1 refill  Future visit scheduled: yes  Notes to clinic:  Request for 90 day supply and 1 refill per pharmacy  Requested Prescriptions  Pending Prescriptions Disp Refills   tamsulosin (FLOMAX) 0.4 MG CAPS capsule [Pharmacy Med Name: TAMSULOSIN HCL 0.4 MG CAPSULE] 90 capsule 1    Sig: TAKE 1 [place]     Urology: Alpha-Adrenergic Blocker Passed - 06/23/2018 11:19 AM      Passed - Last BP in normal range    BP Readings from Last 1 Encounters:  06/18/18 127/79         Passed - Valid encounter within last 12 months    Recent Outpatient Visits          3 weeks ago Flank pain   [facility] Volney American, [place]   7 months ago Prostate cancer [facility])   [facility] Guadalupe Maple, MD   1 year ago Actinic keratosis   [facility] Crissman, Jeannette How, MD   1 year ago Annual physical exam   [facility] Guadalupe Maple, MD   3 years ago Right-sided low back pain with right-sided sciatica   [facility] Valerie Roys, DO      Future Appointments            In 4 months Crissman, Jeannette How, MD [facility], [facility]

## 2018-06-28 ENCOUNTER — Encounter: Payer: Self-pay | Admitting: Anesthesiology

## 2018-06-29 ENCOUNTER — Encounter: Payer: Self-pay | Admitting: Emergency Medicine

## 2018-06-29 ENCOUNTER — Ambulatory Visit: Payer: Medicare Other | Admitting: Anesthesiology

## 2018-06-29 ENCOUNTER — Ambulatory Visit
Admission: RE | Admit: 2018-06-29 | Discharge: 2018-06-29 | Disposition: A | Discharge status: Home / Self Care | Payer: Medicare Other | Source: Ambulatory Visit | Attending: Urology | Admitting: Urology

## 2018-06-29 ENCOUNTER — Other Ambulatory Visit: Payer: Self-pay

## 2018-06-29 ENCOUNTER — Encounter
Admission: RE | Disposition: A | Discharge status: Home / Self Care | Payer: Self-pay | Source: Ambulatory Visit | Attending: Urology

## 2018-06-29 DIAGNOSIS — N201 Calculus of ureter: Secondary | ICD-10-CM

## 2018-06-29 DIAGNOSIS — Z79899 Other long term (current) drug therapy: Secondary | ICD-10-CM | POA: Insufficient documentation

## 2018-06-29 DIAGNOSIS — E7800 Pure hypercholesterolemia, unspecified: Secondary | ICD-10-CM | POA: Diagnosis not present

## 2018-06-29 DIAGNOSIS — N189 Chronic kidney disease, unspecified: Secondary | ICD-10-CM | POA: Diagnosis not present

## 2018-06-29 DIAGNOSIS — N202 Calculus of kidney with calculus of ureter: Secondary | ICD-10-CM | POA: Diagnosis not present

## 2018-06-29 DIAGNOSIS — K219 Gastro-esophageal reflux disease without esophagitis: Secondary | ICD-10-CM | POA: Insufficient documentation

## 2018-06-29 HISTORY — PX: CYSTOSCOPY/URETEROSCOPY/HOLMIUM LASER/STENT PLACEMENT: SHX6546

## 2018-06-29 SURGERY — CYSTOSCOPY/URETEROSCOPY/HOLMIUM LASER/STENT PLACEMENT
Anesthesia: General | Laterality: Left

## 2018-06-29 MED ORDER — FENTANYL CITRATE (PF) 100 MCG/2ML IJ SOLN
INTRAMUSCULAR | Status: AC
  Filled 2018-06-29: qty 2

## 2018-06-29 MED ORDER — FENTANYL CITRATE (PF) 100 MCG/2ML IJ SOLN
INTRAMUSCULAR | Status: AC
  Administered 2018-06-29: 25 ug via INTRAVENOUS
  Filled 2018-06-29: qty 2

## 2018-06-29 MED ORDER — SUGAMMADEX SODIUM 200 MG/2ML IV SOLN
INTRAVENOUS | Status: AC
  Filled 2018-06-29: qty 2

## 2018-06-29 MED ORDER — MIDAZOLAM HCL 2 MG/2ML IJ SOLN
INTRAMUSCULAR | Status: DC | PRN
  Administered 2018-06-29: 2 mg via INTRAVENOUS

## 2018-06-29 MED ORDER — ONDANSETRON HCL 4 MG/2ML IJ SOLN: 4.0000 mg | Freq: Once | INTRAMUSCULAR | Status: DC | PRN

## 2018-06-29 MED ORDER — CEFAZOLIN SODIUM-DEXTROSE 1-4 GM/50ML-% IV SOLN
1.0000 g | INTRAVENOUS | Status: AC
  Administered 2018-06-29: 1 g via INTRAVENOUS

## 2018-06-29 MED ORDER — CEFAZOLIN SODIUM-DEXTROSE 1-4 GM/50ML-% IV SOLN
INTRAVENOUS | Status: AC
  Filled 2018-06-29: qty 50

## 2018-06-29 MED ORDER — PHENYLEPHRINE HCL 10 MG/ML IJ SOLN
INTRAMUSCULAR | Status: DC | PRN
  Administered 2018-06-29: 50 ug via INTRAVENOUS
  Administered 2018-06-29: 100 ug via INTRAVENOUS
  Administered 2018-06-29: 50 ug via INTRAVENOUS
  Administered 2018-06-29: 100 ug via INTRAVENOUS

## 2018-06-29 MED ORDER — PROPOFOL 10 MG/ML IV BOLUS
INTRAVENOUS | Status: AC
  Filled 2018-06-29: qty 20

## 2018-06-29 MED ORDER — OXYBUTYNIN CHLORIDE ER 10 MG PO TB24: 10.0000 mg | ORAL_TABLET | Freq: Every day | ORAL | 0 refills | Status: AC | PRN

## 2018-06-29 MED ORDER — IOPAMIDOL (ISOVUE-M 200) INJECTION 41%: INTRAMUSCULAR | Status: DC | PRN

## 2018-06-29 MED ORDER — ONDANSETRON HCL 4 MG/2ML IJ SOLN
INTRAMUSCULAR | Status: DC | PRN
  Administered 2018-06-29: 4 mg via INTRAVENOUS

## 2018-06-29 MED ORDER — ROCURONIUM BROMIDE 50 MG/5ML IV SOLN
INTRAVENOUS | Status: AC
  Filled 2018-06-29: qty 1

## 2018-06-29 MED ORDER — SUCCINYLCHOLINE CHLORIDE 20 MG/ML IJ SOLN
INTRAMUSCULAR | Status: DC | PRN
  Administered 2018-06-29: 100 mg via INTRAVENOUS

## 2018-06-29 MED ORDER — FENTANYL CITRATE (PF) 100 MCG/2ML IJ SOLN
25.0000 ug | INTRAMUSCULAR | Status: DC | PRN
  Administered 2018-06-29 (×4): 25 ug via INTRAVENOUS

## 2018-06-29 MED ORDER — FAMOTIDINE 20 MG PO TABS
ORAL_TABLET | ORAL | Status: AC
  Administered 2018-06-29: 20 mg via ORAL
  Filled 2018-06-29: qty 1

## 2018-06-29 MED ORDER — FAMOTIDINE 20 MG PO TABS
20.0000 mg | ORAL_TABLET | Freq: Once | ORAL | Status: AC
  Administered 2018-06-29: 20 mg via ORAL

## 2018-06-29 MED ORDER — HYDROCODONE-ACETAMINOPHEN 5-325 MG PO TABS: 1.0000 | ORAL_TABLET | ORAL | 0 refills | Status: DC | PRN

## 2018-06-29 MED ORDER — LIDOCAINE HCL (PF) 2 % IJ SOLN
INTRAMUSCULAR | Status: AC
  Filled 2018-06-29: qty 10

## 2018-06-29 MED ORDER — LACTATED RINGERS IV SOLN
INTRAVENOUS | Status: DC
  Administered 2018-06-29: 13:00:00 via INTRAVENOUS

## 2018-06-29 MED ORDER — LIDOCAINE HCL (CARDIAC) PF 100 MG/5ML IV SOSY
PREFILLED_SYRINGE | INTRAVENOUS | Status: DC | PRN
  Administered 2018-06-29: 100 mg via INTRAVENOUS

## 2018-06-29 MED ORDER — MIDAZOLAM HCL 2 MG/2ML IJ SOLN
INTRAMUSCULAR | Status: AC
  Filled 2018-06-29: qty 2

## 2018-06-29 MED ORDER — PROPOFOL 10 MG/ML IV BOLUS
INTRAVENOUS | Status: DC | PRN
  Administered 2018-06-29: 60 mg via INTRAVENOUS
  Administered 2018-06-29: 140 mg via INTRAVENOUS

## 2018-06-29 MED ORDER — SUGAMMADEX SODIUM 200 MG/2ML IV SOLN
INTRAVENOUS | Status: DC | PRN
  Administered 2018-06-29: 170 mg via INTRAVENOUS

## 2018-06-29 MED ORDER — ROCURONIUM BROMIDE 100 MG/10ML IV SOLN
INTRAVENOUS | Status: DC | PRN
  Administered 2018-06-29: 20 mg via INTRAVENOUS

## 2018-06-29 MED ORDER — SUCCINYLCHOLINE CHLORIDE 20 MG/ML IJ SOLN
INTRAMUSCULAR | Status: AC
  Filled 2018-06-29: qty 1

## 2018-06-29 MED ORDER — FENTANYL CITRATE (PF) 100 MCG/2ML IJ SOLN
INTRAMUSCULAR | Status: DC | PRN
  Administered 2018-06-29 (×2): 50 ug via INTRAVENOUS

## 2018-06-29 MED ORDER — ONDANSETRON HCL 4 MG/2ML IJ SOLN
INTRAMUSCULAR | Status: AC
  Filled 2018-06-29: qty 2

## 2018-06-29 SURGICAL SUPPLY — 35 items
BAG DRAIN CYSTO-URO LG1000N (MISCELLANEOUS) ×2 IMPLANT
BRUSH SCRUB EZ 1% IODOPHOR (MISCELLANEOUS) ×2 IMPLANT
BULB IRRIG PATHFIND (MISCELLANEOUS) IMPLANT
CATH URETL 5X70 OPEN END (CATHETERS) ×1 IMPLANT
CNTNR SPEC 2.5X3XGRAD LEK (MISCELLANEOUS)
CONT SPEC 4OZ STER OR WHT (MISCELLANEOUS)
CONT SPEC 4OZ STRL OR WHT (MISCELLANEOUS)
CONTAINER SPEC 2.5X3XGRAD LEK (MISCELLANEOUS) IMPLANT
DRAPE UTILITY 15X26 TOWEL STRL (DRAPES) ×2 IMPLANT
FIBER LASER LITHO 273 (Laser) ×1 IMPLANT
GLOVE BIOGEL PI IND STRL 7.5 (GLOVE) ×1 IMPLANT
GLOVE BIOGEL PI INDICATOR 7.5 (GLOVE) ×1
GOWN STRL REUS W/ TWL LRG LVL3 (GOWN DISPOSABLE) ×1 IMPLANT
GOWN STRL REUS W/ TWL XL LVL3 (GOWN DISPOSABLE) ×1 IMPLANT
GOWN STRL REUS W/TWL LRG LVL3 (GOWN DISPOSABLE) ×2
GOWN STRL REUS W/TWL XL LVL3 (GOWN DISPOSABLE) ×2
GUIDEWIRE INTRO SET STRAIGHT (WIRE) ×1 IMPLANT
INFUSOR MANOMETER BAG 3000ML (MISCELLANEOUS) ×2 IMPLANT
INTRODUCER DILATOR DOUBLE (INTRODUCER) IMPLANT
KIT TURNOVER CYSTO (KITS) ×2 IMPLANT
PACK CYSTO AR (MISCELLANEOUS) ×2 IMPLANT
SENSORWIRE 0.038 NOT ANGLED (WIRE) ×2
SET CYSTO W/LG BORE CLAMP LF (SET/KITS/TRAYS/PACK) ×2 IMPLANT
SHEATH URETERAL 12FRX35CM (MISCELLANEOUS) IMPLANT
SOL .9 NS 3000ML IRR  AL (IV SOLUTION) ×1
SOL .9 NS 3000ML IRR AL (IV SOLUTION) ×1
SOL .9 NS 3000ML IRR UROMATIC (IV SOLUTION) ×1 IMPLANT
STENT URET 6FRX24 CONTOUR (STENTS) IMPLANT
STENT URET 6FRX26 CONTOUR (STENTS) IMPLANT
SURGILUBE 2OZ TUBE FLIPTOP (MISCELLANEOUS) ×2 IMPLANT
SYR 10ML LL (SYRINGE) ×2 IMPLANT
TUBING ART PRESS 48 MALE/FEM (TUBING) IMPLANT
VALVE UROSEAL ADJ ENDO (VALVE) ×1 IMPLANT
WATER STERILE IRR 1000ML POUR (IV SOLUTION) ×2 IMPLANT
WIRE SENSOR 0.038 NOT ANGLED (WIRE) ×1 IMPLANT

## 2018-06-29 NOTE — H&P (Signed)
UROLOGY H&P UPDATE  Agree with prior H&P dated 10/28.  Left 73mm distal ureteral stone, and 1cm upper pole stone  Cardiac: RRR Lungs: CTA bilaterally  Laterality: LEFT Procedure: LEFT ureteroscopy, laser lithotripsy, stent placement  Urinalysis:  Urine cx 11/11 negative  Informed consent obtained, we specifically discussed the risk of bleeding, infection, ureteral injury, need for additional procedures, inability to access stone, and stent related symptoms.  Billey Co, MD 06/29/2018

## 2018-06-29 NOTE — Anesthesia Procedure Notes (Signed)
Procedure Name: Intubation Date/Time: 06/29/2018 2:53 PM Performed by: Lavone Orn, CRNA Pre-anesthesia Checklist: Patient identified, Emergency Drugs available, Suction available, Patient being monitored and Timeout performed Patient Re-evaluated:Patient Re-evaluated prior to induction Oxygen Delivery Method: Circle system utilized Preoxygenation: Pre-oxygenation with 100% oxygen Induction Type: IV induction Ventilation: Mask ventilation without difficulty Laryngoscope Size: Mac and 4 Grade View: Grade II Tube type: Oral Tube size: 7.5 mm Number of attempts: 1 Airway Equipment and Method: Stylet Placement Confirmation: ETT inserted through vocal cords under direct vision,  positive ETCO2 and breath sounds checked- equal and bilateral Secured at: 23 cm Tube secured with: Tape Dental Injury: Teeth and Oropharynx as per pre-operative assessment

## 2018-06-29 NOTE — Transfer of Care (Signed)
Immediate Anesthesia Transfer of Care Note  Patient: Elijah Taylor  Procedure(s) Performed: CYSTOSCOPY/URETEROSCOPY/HOLMIUM LASER/STENT PLACEMENT (Left )  Patient Location: PACU  Anesthesia Type:General  Level of Consciousness: awake, alert  and responds to stimulation  Airway & Oxygen Therapy: Patient Spontanous Breathing and Patient connected to face mask oxygen  Post-op Assessment: Report given to RN and Post -op Vital signs reviewed and stable  Post vital signs: Reviewed and stable  Last Vitals:  Vitals Value Taken Time  BP 129/98 06/29/2018  3:45 PM  Temp    Pulse 90 06/29/2018  3:45 PM  Resp 16 06/29/2018  3:45 PM  SpO2 100 % 06/29/2018  3:45 PM  Vitals shown include unvalidated device data.  Last Pain:  Vitals:   06/29/18 1321  TempSrc: Tympanic  PainSc: 0-No pain         Complications: No apparent anesthesia complications

## 2018-06-29 NOTE — Discharge Instructions (Signed)
  AMBULATORY SURGERY  DISCHARGE INSTRUCTIONS   1) The drugs that you were given will stay in your system until tomorrow so for the next 24 hours you should not:  A) Drive an automobile B) Make any legal decisions C) Drink any alcoholic beverage   2) You may resume regular meals tomorrow.  Today it is better to start with liquids and gradually work up to solid foods.  You may eat anything you prefer, but it is better to start with liquids, then soup and crackers, and gradually work up to solid foods.   3) Please notify your doctor immediately if you have any unusual bleeding, trouble breathing, redness and pain at the surgery site, drainage, fever, or pain not relieved by medication.    4) Additional Instructions: TAKE A STOOL SOFTENER TWICE A DAY WHILE TAKING NARCOTIC PAIN MEDICINE TO PREVENT CONSTIPATION   Please contact your physician with any problems or Same Day Surgery at [phone], Monday through Friday 6 am to 4 pm, or [place] [facility] [place] Main number at [phone].   

## 2018-06-29 NOTE — Anesthesia Post-op Follow-up Note (Signed)
Anesthesia QCDR form completed.        

## 2018-06-29 NOTE — Anesthesia Preprocedure Evaluation (Signed)
Anesthesia Evaluation  Patient identified by MRN, date of birth, ID band Patient awake    Reviewed: Allergy & Precautions, NPO status , Patient's Chart, lab work & pertinent test results, reviewed documented beta blocker date and time   Airway Mallampati: II  TM Distance: >3 FB     Dental  (+) Chipped   Pulmonary           Cardiovascular      Neuro/Psych    GI/Hepatic GERD  Controlled,  Endo/Other    Renal/GU Renal disease     Musculoskeletal  (+) Arthritis ,   Abdominal   Peds  Hematology   Anesthesia Other Findings Neck movement ok. EKG poor R's otherwise ok.  Reproductive/Obstetrics                             Anesthesia Physical Anesthesia Plan  ASA: III  Anesthesia Plan: General   Post-op Pain Management:    Induction: Intravenous  PONV Risk Score and Plan:   Airway Management Planned: Oral ETT and LMA  Additional Equipment:   Intra-op Plan:   Post-operative Plan:   Informed Consent: I have reviewed the patients History and Physical, chart, labs and discussed the procedure including the risks, benefits and alternatives for the proposed anesthesia with the patient or authorized representative who has indicated his/her understanding and acceptance.     Plan Discussed with: CRNA  Anesthesia Plan Comments:         Anesthesia Quick Evaluation

## 2018-06-29 NOTE — Anesthesia Postprocedure Evaluation (Signed)
Anesthesia Post Note  Patient: Elijah Taylor  Procedure(s) Performed: CYSTOSCOPY/URETEROSCOPY/HOLMIUM LASER/STENT PLACEMENT (Left )  Patient location during evaluation: PACU Anesthesia Type: General Level of consciousness: awake and alert Pain management: pain level controlled Vital Signs Assessment: post-procedure vital signs reviewed and stable Respiratory status: spontaneous breathing, nonlabored ventilation, respiratory function stable and patient connected to nasal cannula oxygen Cardiovascular status: blood pressure returned to baseline and stable Postop Assessment: no apparent nausea or vomiting Anesthetic complications: no     Last Vitals:  Vitals:   06/29/18 1638 06/29/18 1700  BP: (!) 142/78 118/76  Pulse: 82 88  Resp: 16 16  Temp: (!) 36.4 C   SpO2: 98% 96%    Last Pain:  Vitals:   06/29/18 1700  TempSrc:   PainSc: 4                  Precious Haws Piscitello

## 2018-06-29 NOTE — Op Note (Signed)
Date of procedure: 06/29/18  Preoperative diagnosis:  1. Left 5 mm distal ureteral stone  Postoperative diagnosis:  1. Same  Procedure: 1. Cystoscopy, left ureteroscopy, laser lithotripsy, stent placement  Surgeon: Nickolas Madrid, MD  Anesthesia: General  Complications: None  Intraoperative findings:  1.  No suspicious lesions in the bladder 2.  Prostate surgically absent 3.  Left distal ureteral stone dusted 4.  Unable to pass a flexible ureteroscope passed narrowing and left ureter, uncomplicated stent placement  EBL: Minimal  Specimens: None  Drains: Left 6 French by 26 cm ureteral stent  Indication: Elijah Taylor is a 67 y.o. patient with left 5 mm distal ureteral stone seen on CT scan, as well as a 1 cm renal stone.  After reviewing the management options for treatment, they elected to proceed with the above surgical procedure(s). We have discussed the potential benefits and risks of the procedure, side effects of the proposed treatment, the likelihood of the patient achieving the goals of the procedure, and any potential problems that might occur during the procedure or recuperation. Informed consent has been obtained.  Description of procedure:  The patient was taken to the operating room and general anesthesia was induced.  The patient was placed in the dorsal lithotomy position, prepped and draped in the usual sterile fashion, and preoperative antibiotics(Cipro) were administered. A preoperative time-out was performed.   A 21 French rigid cystoscope was used to intubate the urethra.  Normal-appearing urethra was followed proximally into the bladder.  The prostate was surgically absent.  There is a small amount of tissue consistent with his prior microplastique injections.  A sensor wire was advanced into the left ureteral orifice and passed up to the renal pelvis under fluoroscopic vision.  The semirigid ureteroscope was then advanced alongside the wire and we  identified a black 5 mm stone in the distal ureter.  This was fragmented to dust using the 270 m laser fiber on settings of 1.0 J and 10 Hz.  All fragments were irrigated free of the ureter.  I then tried to pass the digital flexible ureteroscope over the wire but met resistance in the distal ureter.  I then used 8/10French dilators to dilate the ureter up to the level of the proximal ureter.  I then again tried to repassed flexible ureteroscope but again continued to meet resistance in the distal ureter.  I then looked back alongside the wire with the semirigid scope and noted some narrowing at that area where the stone had previously been lodged.  At this point I elected to terminate the case with plan for a follow-up shockwave lithotripsy on his left renal stone.  A left 6 [place] by 26 cm stent was uneventfully placed over the wire under direct vision with a good curl noted in the renal pelvis as well as under direct vision in the bladder.  Urine was seen to drain through the side-ports of the stent.  His left renal stone is 950HU, 6 cm skin to stone distance, and clearly seen on KUB.  Disposition: Stable to PACU  Plan: Follow-up for shockwave lithotripsy on the left side on 07/12/2018, likely remove stent 5 days after.  Nickolas Madrid, MD

## 2018-06-30 ENCOUNTER — Encounter: Payer: Self-pay | Admitting: Urology

## 2018-07-02 ENCOUNTER — Telehealth: Payer: Self-pay | Admitting: Urology

## 2018-07-02 NOTE — Telephone Encounter (Signed)
Spoke with patient regarding his concerns.  He has not passed anything bigger than a quarter size and does not have a fever, he just wanted to be sure.

## 2018-07-02 NOTE — Telephone Encounter (Signed)
Yes, normal to have some blood with the stent in. Drink plenty of fluids to keep the bladder flushed. Call if clots bigger than a quarter size in the urine, or fever over 101.3.  Amy will reach out to him to schedule the right shockwave procedure, and we will remove his stent about a week after that.  Nickolas Madrid, MD 07/02/2018

## 2018-07-02 NOTE — Telephone Encounter (Signed)
Pt left message that he is having post op problems and would like for someone to give him a call.  [phone].

## 2018-07-02 NOTE — Telephone Encounter (Signed)
I called and spoke with the patient and he is concerned about the blood he is having and wanted to make sure that it was normal to bleed this long after surgery? He said that when he urinates it is red and then turns dark brown?  He is just checking.   Thanks, Sharyn Lull

## 2018-07-03 ENCOUNTER — Other Ambulatory Visit: Payer: Self-pay | Admitting: Radiology

## 2018-07-03 DIAGNOSIS — N201 Calculus of ureter: Secondary | ICD-10-CM

## 2018-07-04 ENCOUNTER — Other Ambulatory Visit: Payer: Medicare Other

## 2018-07-04 ENCOUNTER — Other Ambulatory Visit: Payer: Self-pay | Admitting: Radiology

## 2018-07-04 DIAGNOSIS — N200 Calculus of kidney: Secondary | ICD-10-CM

## 2018-07-04 DIAGNOSIS — N201 Calculus of ureter: Secondary | ICD-10-CM

## 2018-07-07 LAB — CULTURE, URINE COMPREHENSIVE

## 2018-07-10 ENCOUNTER — Other Ambulatory Visit: Payer: Medicare Other | Admitting: Urology

## 2018-07-12 ENCOUNTER — Ambulatory Visit
Admission: RE | Admit: 2018-07-12 | Discharge: 2018-07-12 | Disposition: A | Discharge status: Home / Self Care | Payer: Medicare Other | Source: Ambulatory Visit | Attending: Urology | Admitting: Urology

## 2018-07-12 ENCOUNTER — Encounter
Admission: RE | Disposition: A | Discharge status: Home / Self Care | Payer: Self-pay | Source: Ambulatory Visit | Attending: Urology

## 2018-07-12 ENCOUNTER — Ambulatory Visit: Payer: Medicare Other

## 2018-07-12 ENCOUNTER — Other Ambulatory Visit: Payer: Self-pay

## 2018-07-12 DIAGNOSIS — Z8042 Family history of malignant neoplasm of prostate: Secondary | ICD-10-CM | POA: Diagnosis not present

## 2018-07-12 DIAGNOSIS — M1990 Unspecified osteoarthritis, unspecified site: Secondary | ICD-10-CM | POA: Diagnosis not present

## 2018-07-12 DIAGNOSIS — Z841 Family history of disorders of kidney and ureter: Secondary | ICD-10-CM | POA: Diagnosis not present

## 2018-07-12 DIAGNOSIS — Z981 Arthrodesis status: Secondary | ICD-10-CM | POA: Diagnosis not present

## 2018-07-12 DIAGNOSIS — Z808 Family history of malignant neoplasm of other organs or systems: Secondary | ICD-10-CM | POA: Diagnosis not present

## 2018-07-12 DIAGNOSIS — G4700 Insomnia, unspecified: Secondary | ICD-10-CM | POA: Insufficient documentation

## 2018-07-12 DIAGNOSIS — Z8249 Family history of ischemic heart disease and other diseases of the circulatory system: Secondary | ICD-10-CM | POA: Insufficient documentation

## 2018-07-12 DIAGNOSIS — Z87442 Personal history of urinary calculi: Secondary | ICD-10-CM | POA: Diagnosis not present

## 2018-07-12 DIAGNOSIS — K219 Gastro-esophageal reflux disease without esophagitis: Secondary | ICD-10-CM | POA: Insufficient documentation

## 2018-07-12 DIAGNOSIS — I10 Essential (primary) hypertension: Secondary | ICD-10-CM | POA: Diagnosis not present

## 2018-07-12 DIAGNOSIS — Z8546 Personal history of malignant neoplasm of prostate: Secondary | ICD-10-CM | POA: Insufficient documentation

## 2018-07-12 DIAGNOSIS — N200 Calculus of kidney: Secondary | ICD-10-CM | POA: Diagnosis not present

## 2018-07-12 HISTORY — PX: EXTRACORPOREAL SHOCK WAVE LITHOTRIPSY: SHX1557

## 2018-07-12 SURGERY — LITHOTRIPSY, ESWL
Anesthesia: Moderate Sedation | Laterality: Left

## 2018-07-12 MED ORDER — DIPHENHYDRAMINE HCL 25 MG PO CAPS
25.0000 mg | ORAL_CAPSULE | ORAL | Status: AC
  Administered 2018-07-12: 25 mg via ORAL

## 2018-07-12 MED ORDER — CIPROFLOXACIN HCL 500 MG PO TABS
ORAL_TABLET | ORAL | Status: AC
  Administered 2018-07-12: 500 mg via ORAL
  Filled 2018-07-12: qty 1

## 2018-07-12 MED ORDER — ONDANSETRON HCL 2 MG/ML IV SOLN: 4.0000 mg | Freq: Once | INTRAVENOUS | Status: DC

## 2018-07-12 MED ORDER — SODIUM CHLORIDE 0.9 % IV SOLN
INTRAVENOUS | Status: DC
  Administered 2018-07-12: 07:00:00 via INTRAVENOUS

## 2018-07-12 MED ORDER — DIAZEPAM 5 MG PO TABS
10.0000 mg | ORAL_TABLET | ORAL | Status: AC
  Administered 2018-07-12: 10 mg via ORAL

## 2018-07-12 MED ORDER — DIAZEPAM 5 MG PO TABS
ORAL_TABLET | ORAL | Status: AC
  Administered 2018-07-12: 10 mg via ORAL
  Filled 2018-07-12: qty 2

## 2018-07-12 MED ORDER — DIPHENHYDRAMINE HCL 25 MG PO CAPS
ORAL_CAPSULE | ORAL | Status: AC
  Administered 2018-07-12: 25 mg via ORAL
  Filled 2018-07-12: qty 1

## 2018-07-12 MED ORDER — CIPROFLOXACIN HCL 500 MG PO TABS
500.0000 mg | ORAL_TABLET | ORAL | Status: AC
  Administered 2018-07-12: 500 mg via ORAL

## 2018-07-12 MED ORDER — ONDANSETRON HCL 4 MG/2ML IJ SOLN
4.0000 mg | Freq: Once | INTRAMUSCULAR | Status: AC
  Administered 2018-07-12: 4 mg via INTRAVENOUS

## 2018-07-12 MED ORDER — ONDANSETRON HCL 4 MG/2ML IJ SOLN
INTRAMUSCULAR | Status: AC
  Administered 2018-07-12: 4 mg via INTRAVENOUS
  Filled 2018-07-12: qty 2

## 2018-07-12 MED ORDER — SULFAMETHOXAZOLE-TRIMETHOPRIM 800-160 MG PO TABS: 1.0000 | ORAL_TABLET | Freq: Two times a day (BID) | ORAL | 0 refills | Status: DC

## 2018-07-12 NOTE — Discharge Instructions (Signed)
Lithotripsy, Care After °This sheet gives you information about how to care for yourself after your procedure. Your health care provider may also give you more specific instructions. If you have problems or questions, contact your health care provider. °What can I expect after the procedure? °After the procedure, it is common to have: °· Some blood in your urine. This should only last for a few days. °· Soreness in your back, sides, or upper abdomen for a few days. °· Blotches or bruises on your back where the pressure wave entered the skin. °· Pain, discomfort, or nausea when pieces (fragments) of the kidney stone move through the tube that carries urine from the kidney to the bladder (ureter). Stone fragments may pass soon after the procedure, but they may continue to pass for up to 4-8 weeks. °? If you have severe pain or nausea, contact your health care provider. This may be caused by a large stone that was not broken up, and this may mean that you need more treatment. °· Some pain or discomfort during urination. °· Some pain or discomfort in the lower abdomen or (in men) at the base of the penis. ° °Follow these instructions at home: °Medicines °· Take over-the-counter and prescription medicines only as told by your health care provider. °· If you were prescribed an antibiotic medicine, take it as told by your health care provider. Do not stop taking the antibiotic even if you start to feel better. °· Do not drive for 24 hours if you were given a medicine to help you relax (sedative). °· Do not drive or use heavy machinery while taking prescription pain medicine. °Eating and drinking °· Drink enough water and fluids to keep your urine clear or pale yellow. This helps any remaining pieces of the stone to pass. It can also help prevent new stones from forming. °· Eat plenty of fresh fruits and vegetables. °· Follow instructions from your health care provider about eating and drinking restrictions. You may be  instructed: °? To reduce how much salt (sodium) you eat or drink. Check ingredients and nutrition facts on packaged foods and beverages. °? To reduce how much meat you eat. °· Eat the recommended amount of calcium for your age and gender. Ask your health care provider how much calcium you should have. °General instructions °· Get plenty of rest. °· Most people can resume normal activities 1-2 days after the procedure. Ask your health care provider what activities are safe for you. °· If directed, strain all urine through the strainer that was provided by your health care provider. °? Keep all fragments for your health care provider to see. Any stones that are found may be sent to a medical lab for examination. The stone may be as small as a grain of salt. °· Keep all follow-up visits as told by your health care provider. This is important. °Contact a health care provider if: °· You have pain that is severe or does not get better with medicine. °· You have nausea that is severe or does not go away. °· You have blood in your urine longer than your health care provider told you to expect. °· You have more blood in your urine. °· You have pain during urination that does not go away. °· You urinate more frequently than usual and this does not go away. °· You develop a rash or any other possible signs of an allergic reaction. °Get help right away if: °· You have severe pain in   your back, sides, or upper abdomen. °· You have severe pain while urinating. °· Your urine is very dark red. °· You have blood in your stool (feces). °· You cannot pass any urine at all. °· You feel a strong urge to urinate after emptying your bladder. °· You have a fever or chills. °· You develop shortness of breath, difficulty breathing, or chest pain. °· You have severe nausea that leads to persistent vomiting. °· You faint. °Summary °· After this procedure, it is common to have some pain, discomfort, or nausea when pieces (fragments) of the  kidney stone move through the tube that carries urine from the kidney to the bladder (ureter). If this pain or nausea is severe, however, you should contact your health care provider. °· Most people can resume normal activities 1-2 days after the procedure. Ask your health care provider what activities are safe for you. °· Drink enough water and fluids to keep your urine clear or pale yellow. This helps any remaining pieces of the stone to pass, and it can help prevent new stones from forming. °· If directed, strain your urine and keep all fragments for your health care provider to see. Fragments or stones may be as small as a grain of salt. °· Get help right away if you have severe pain in your back, sides, or upper abdomen or have severe pain while urinating. °This information is not intended to replace advice given to you by your health care provider. Make sure you discuss any questions you have with your health care provider. °Document Released: 08/14/2007 Document Revised: 06/15/2016 Document Reviewed: 06/15/2016 °Elsevier Interactive Patient Education © 2018 [employer] ° ° °AMBULATORY SURGERY  °DISCHARGE INSTRUCTIONS ° ° °1) The drugs that you were given will stay in your system until tomorrow so for the next 24 hours you should not: ° °A) Drive an automobile °B) Make any legal decisions °C) Drink any alcoholic beverage ° ° °2) You may resume regular meals tomorrow.  Today it is better to start with liquids and gradually work up to solid foods. ° °You may eat anything you prefer, but it is better to start with liquids, then soup and crackers, and gradually work up to solid foods. ° ° °3) Please notify your doctor immediately if you have any unusual bleeding, trouble breathing, redness and pain at the surgery site, drainage, fever, or pain not relieved by medication. ° ° ° °4) Additional Instructions: ° ° ° ° ° ° ° °Please contact your physician with any problems or Same Day Surgery at [phone], Monday  through Friday 6 am to 4 pm, or [place] [facility] [place] Main number at [phone]. °

## 2018-07-12 NOTE — H&P (Signed)
7:36 AM   Elijah Taylor 10/27/1950 [id number]   HPI: Mr. Elijah Taylor is a 67 year old M here for left SWL for 1cm mid-pole stone. Unable to be accessed via URS. Left stent in place, no stent symptoms at this time. Urine culture 11/27 no growth. Denies fevers/chills, chest pain, SOB.   PMH: Past Medical History:  Diagnosis Date  . Arthritis   . Chronic kidney disease    H/O KIDNEY STONES  . GERD (gastroesophageal reflux disease)   . History of kidney stones   . Insomnia   . Prostate cancer ([place])   . 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 Wears glasses     Surgical History: Past Surgical History:  Procedure Laterality Date  . CARPAL TUNNEL RELEASE     left  . CARPAL TUNNEL RELEASE Right 12/01/2014   Procedure: RIGHT CARPAL TUNNEL RELEASE;  Surgeon: Daryll Brod, MD;  Location: [place];  Service: Orthopedics;  Laterality: Right;  . CERVICAL FUSION  2007  . COLONOSCOPY    . COLONOSCOPY WITH PROPOFOL N/A 11/07/2017   Procedure: COLONOSCOPY WITH PROPOFOL;  Surgeon: [name], MD;  Location: [facility]COPY;  Service: Gastroenterology;  Laterality: N/A;  . CYSTOSCOPY MACROPLASTIQUE IMPLANT N/A 09/15/2015   Procedure: CYSTOSCOPY MACROPLASTIQUE IMPLANT;  Surgeon: Royston Cowper, MD;  Location: [facility] ORS;  Service: Urology;  Laterality: N/A;  . CYSTOSCOPY/URETEROSCOPY/HOLMIUM LASER/STENT PLACEMENT Left 06/29/2018   Procedure: CYSTOSCOPY/URETEROSCOPY/HOLMIUM LASER/STENT PLACEMENT;  Surgeon: Billey Co, MD;  Location: [facility] ORS;  Service: Urology;  Laterality: Left;  . RETROPUBIC PROSTATECTOMY  2010  . SHOULDER ARTHROSCOPY W/ ROTATOR CUFF REPAIR     right and left  . TRIGGER FINGER RELEASE     x 4  . TRIGGER FINGER RELEASE Left 09/02/2014   Procedure: RELEASE A-1 PULLEY LEFT INDEX FINGER;  Surgeon: Daryll Brod, MD;  Location: [place];  Service: Orthopedics;  Laterality: Left;  ANESTHESIA: IV REGIONAL FAB  . TRIGGER FINGER  RELEASE Right 05/16/2017   Procedure: RELEASE TRIGGER FINGER/A-1 PULLEY RIGHT MIDDLE FINGER;  Surgeon: Daryll Brod, MD;  Location: [place];  Service: Orthopedics;  Laterality: Right;    Allergies: No Known Allergies  Family History: Family History  Problem Relation Age of Onset  . Cancer Mother        Brain Tumor  . Kidney failure Father   . Prostate cancer Father   . Heart disease Brother   . Heart attack Maternal Grandfather   . Prostate cancer Maternal Uncle   . Prostate cancer Maternal Uncle     Social History:  reports that he has never smoked. He has never used smokeless tobacco. He reports that he drinks about 1.0 standard drinks of alcohol per week. He reports that he does not use drugs.  ROS: Please see flowsheet from today's date for complete review of systems.  Physical Exam: BP 121/80   Pulse 88   Temp (!) 97 F (36.1 C) (Tympanic)   Resp 18   Ht 5' 9.5" (1.765 m)   Wt 81.6 kg   SpO2 97%   BMI 26.20 kg/m    Constitutional:  Alert and oriented, No acute distress. Cardiovascular:RRR Respiratory: CTA b/l GI: Abdomen is soft, nontender, nondistended, no abdominal masses Lymph: No cervical or inguinal lymphadenopathy. Skin: No rashes, bruises or suspicious lesions. Neurologic: Grossly intact, no focal deficits, moving all 4 extremities. Psychiatric: Normal mood and affect.  Laboratory Data: Urine cx no growth 11/27  Pertinent Imaging: I have personally reviewed the KUB, 1 cm clearly visible mid pole stone, on CT SSD 7cm, 920HU  Assessment & Plan:   67 year old male with left ureteral stent in place, here for SWL of left mid pole 1cm renal stone, 7cm SSD, 920 HU.  Discussed risks and benefits of SWL including bleeding, infection, need for additional procedures, steinstrasse. Plan for stent removal in one week.   Billey Co, [facility] [address], [place], [place] [address] [phone]

## 2018-07-13 DIAGNOSIS — H2512 Age-related nuclear cataract, left eye: Secondary | ICD-10-CM | POA: Diagnosis not present

## 2018-07-13 DIAGNOSIS — H2513 Age-related nuclear cataract, bilateral: Secondary | ICD-10-CM | POA: Diagnosis not present

## 2018-07-18 ENCOUNTER — Ambulatory Visit
Admission: RE | Admit: 2018-07-18 | Discharge: 2018-07-18 | Disposition: A | Discharge status: Home / Self Care | Payer: Medicare Other | Source: Ambulatory Visit | Attending: Urology | Admitting: Urology

## 2018-07-18 ENCOUNTER — Ambulatory Visit
Admission: RE | Admit: 2018-07-18 | Discharge: 2018-07-18 | Disposition: A | Discharge status: Home / Self Care | Payer: Medicare Other | Attending: Urology | Admitting: Urology

## 2018-07-18 DIAGNOSIS — N201 Calculus of ureter: Secondary | ICD-10-CM | POA: Insufficient documentation

## 2018-07-18 DIAGNOSIS — N200 Calculus of kidney: Secondary | ICD-10-CM | POA: Diagnosis not present

## 2018-07-19 ENCOUNTER — Ambulatory Visit (INDEPENDENT_AMBULATORY_CARE_PROVIDER_SITE_OTHER): Payer: Medicare Other | Admitting: Urology

## 2018-07-19 ENCOUNTER — Encounter: Payer: Self-pay | Admitting: Urology

## 2018-07-19 VITALS — BP 131/77 | HR 83 | Ht 69.0 in | Wt 180.0 lb

## 2018-07-19 DIAGNOSIS — Z01812 Encounter for preprocedural laboratory examination: Secondary | ICD-10-CM

## 2018-07-19 DIAGNOSIS — N200 Calculus of kidney: Secondary | ICD-10-CM

## 2018-07-19 DIAGNOSIS — R3129 Other microscopic hematuria: Secondary | ICD-10-CM | POA: Diagnosis not present

## 2018-07-19 DIAGNOSIS — Z8546 Personal history of malignant neoplasm of prostate: Secondary | ICD-10-CM

## 2018-07-19 LAB — MICROSCOPIC EXAMINATION

## 2018-07-19 LAB — URINALYSIS, COMPLETE
Bilirubin, UA: NEGATIVE
Glucose, UA: NEGATIVE
Ketones, UA: NEGATIVE
Nitrite, UA: NEGATIVE
Specific Gravity, UA: 1.025 (ref 1.005–1.030)
Urobilinogen, Ur: 0.2 mg/dL (ref 0.2–1.0)
pH, UA: 7 (ref 5.0–7.5)

## 2018-07-19 MED ORDER — TAMSULOSIN HCL 0.4 MG PO CAPS: 0.4000 mg | ORAL_CAPSULE | Freq: Every day | ORAL | 0 refills | Status: DC

## 2018-07-19 NOTE — Progress Notes (Signed)
Cystoscopy Procedure Note:  Indication: Stent removal s/p LEFT URS/LL/stent 11/22 with follow up LEFT SWL for renal stone  After informed consent and discussion of the procedure and its risks, Elijah Taylor was positioned and prepped in the standard fashion. Cystoscopy was performed with a flexible cystoscope. The stent was grasped with flexible graspers and removed in its entirety. The patient tolerated the procedure well.  Findings: Uncomplicated stent removal  Assessment and Plan: Follow up in 6 months for KUB and PSA(Hx of prostatectomy with Dr. Yves Dill 2011)   Billey Co, MD 07/19/2018

## 2018-07-19 NOTE — Addendum Note (Signed)
Addended by: Tommy Rainwater on: 07/19/2018 02:12 PM   Modules accepted: Orders

## 2018-10-03 DIAGNOSIS — N200 Calculus of kidney: Secondary | ICD-10-CM | POA: Diagnosis not present

## 2018-10-03 DIAGNOSIS — N393 Stress incontinence (female) (male): Secondary | ICD-10-CM | POA: Diagnosis not present

## 2018-10-03 DIAGNOSIS — D400 Neoplasm of uncertain behavior of prostate: Secondary | ICD-10-CM | POA: Diagnosis not present

## 2018-10-03 DIAGNOSIS — G4700 Insomnia, unspecified: Secondary | ICD-10-CM | POA: Diagnosis not present

## 2018-10-03 DIAGNOSIS — C61 Malignant neoplasm of prostate: Secondary | ICD-10-CM | POA: Diagnosis not present

## 2018-10-18 ENCOUNTER — Encounter
Admission: RE | Disposition: A | Discharge status: Home / Self Care | Payer: Self-pay | Source: Home / Self Care | Attending: Urology

## 2018-10-18 ENCOUNTER — Ambulatory Visit
Admission: RE | Admit: 2018-10-18 | Discharge: 2018-10-18 | Disposition: A | Discharge status: Home / Self Care | Payer: Medicare Other | Attending: Urology | Admitting: Urology

## 2018-10-18 DIAGNOSIS — Z7982 Long term (current) use of aspirin: Secondary | ICD-10-CM | POA: Insufficient documentation

## 2018-10-18 DIAGNOSIS — N200 Calculus of kidney: Secondary | ICD-10-CM

## 2018-10-18 HISTORY — PX: EXTRACORPOREAL SHOCK WAVE LITHOTRIPSY: SHX1557

## 2018-10-18 SURGERY — LITHOTRIPSY, ESWL
Anesthesia: Moderate Sedation | Laterality: Right

## 2018-10-18 MED ORDER — MORPHINE SULFATE (PF) 10 MG/ML IV SOLN
10.0000 mg | Freq: Once | INTRAVENOUS | Status: AC
  Administered 2018-10-18: 10 mg via INTRAMUSCULAR

## 2018-10-18 MED ORDER — ONDANSETRON 8 MG PO TBDP: 4.0000 mg | ORAL_TABLET | Freq: Four times a day (QID) | ORAL | 3 refills | Status: DC | PRN

## 2018-10-18 MED ORDER — DIPHENHYDRAMINE HCL 25 MG PO CAPS
25.0000 mg | ORAL_CAPSULE | Freq: Once | ORAL | Status: AC
  Administered 2018-10-18: 25 mg via ORAL

## 2018-10-18 MED ORDER — PROMETHAZINE HCL 25 MG/ML IJ SOLN
INTRAMUSCULAR | Status: AC
  Administered 2018-10-18: 25 mg via INTRAMUSCULAR
  Filled 2018-10-18: qty 1

## 2018-10-18 MED ORDER — PROMETHAZINE HCL 25 MG/ML IJ SOLN
25.0000 mg | Freq: Once | INTRAMUSCULAR | Status: AC
  Administered 2018-10-18: 25 mg via INTRAMUSCULAR

## 2018-10-18 MED ORDER — FUROSEMIDE 10 MG/ML IJ SOLN
10.0000 mg | Freq: Once | INTRAMUSCULAR | Status: AC
  Administered 2018-10-18: 10 mg via INTRAVENOUS

## 2018-10-18 MED ORDER — LEVOFLOXACIN 500 MG PO TABS
ORAL_TABLET | ORAL | Status: AC
  Administered 2018-10-18: 500 mg via ORAL
  Filled 2018-10-18: qty 1

## 2018-10-18 MED ORDER — DEXTROSE-NACL 5-0.45 % IV SOLN
INTRAVENOUS | Status: DC
  Administered 2018-10-18: 13:00:00 via INTRAVENOUS

## 2018-10-18 MED ORDER — TAMSULOSIN HCL 0.4 MG PO CAPS: 0.4000 mg | ORAL_CAPSULE | Freq: Every day | ORAL | 0 refills | Status: DC

## 2018-10-18 MED ORDER — DIPHENHYDRAMINE HCL 25 MG PO CAPS
ORAL_CAPSULE | ORAL | Status: AC
  Administered 2018-10-18: 25 mg via ORAL
  Filled 2018-10-18: qty 1

## 2018-10-18 MED ORDER — LEVOFLOXACIN 500 MG PO TABS
500.0000 mg | ORAL_TABLET | Freq: Once | ORAL | Status: AC
  Administered 2018-10-18: 500 mg via ORAL

## 2018-10-18 MED ORDER — ACETAMINOPHEN-CODEINE #3 300-30 MG PO TABS: 1.0000 | ORAL_TABLET | ORAL | 2 refills | Status: DC | PRN

## 2018-10-18 MED ORDER — MIDAZOLAM HCL 2 MG/2ML IJ SOLN
INTRAMUSCULAR | Status: AC
  Administered 2018-10-18: 1 mg via INTRAMUSCULAR
  Filled 2018-10-18: qty 2

## 2018-10-18 MED ORDER — FUROSEMIDE 10 MG/ML IJ SOLN
INTRAMUSCULAR | Status: AC
  Administered 2018-10-18: 10 mg via INTRAVENOUS
  Filled 2018-10-18: qty 2

## 2018-10-18 MED ORDER — MIDAZOLAM HCL 2 MG/2ML IJ SOLN
1.0000 mg | Freq: Once | INTRAMUSCULAR | Status: AC
  Administered 2018-10-18: 1 mg via INTRAMUSCULAR

## 2018-10-18 MED ORDER — MORPHINE SULFATE (PF) 10 MG/ML IV SOLN
INTRAVENOUS | Status: AC
  Administered 2018-10-18: 10 mg via INTRAMUSCULAR
  Filled 2018-10-18: qty 1

## 2018-10-18 NOTE — Discharge Instructions (Signed)
Kidney Stones  Kidney stones (urolithiasis) are solid, rock-like deposits that form inside of the organs that make urine (kidneys). A kidney stone may form in a kidney and move into the bladder, where it can cause intense pain and block the flow of urine. Kidney stones are created when high levels of certain minerals are found in the urine. They are usually passed through urination, but in some cases, medical treatment may be needed to remove them. What are the causes? Kidney stones may be caused by:  A condition in which certain glands produce too much parathyroid hormone (primary hyperparathyroidism), which causes too much calcium buildup in the blood.  Buildup of uric acid crystals in the bladder (hyperuricosuria). Uric acid is a chemical that the body produces when you eat certain foods. It usually exits the body in the urine.  Narrowing (stricture) of one or both of the tubes that drain urine from the kidneys to the bladder (ureters).  A kidney blockage that is present at birth (congenital obstruction).  Past surgery on the kidney or the ureters, such as gastric bypass surgery. What increases the risk? The following factors make you more likely to develop kidney stones:  Having had a kidney stone in the past.  Having a family history of kidney stones.  Not drinking enough water.  Eating a diet that is high in protein, salt (sodium), or sugar.  Being overweight or obese. What are the signs or symptoms? Symptoms of a kidney stone may include:  Nausea.  Vomiting.  Blood in the urine (hematuria).  Pain in the side of the abdomen, right below the ribs (flank pain). Pain usually spreads (radiates) to the groin.  Needing to urinate frequently or urgently. How is this diagnosed? This condition may be diagnosed based on:  Your medical history.  A physical exam.  Blood tests.  Urine tests.  CT scan.  Abdominal X-ray.  A procedure to examine the inside of the bladder  (cystoscopy). How is this treated? Treatment for kidney stones depends on the size, location, and makeup of the stones. Treatment may involve:  Analyzing your urine before and after you pass the stone through urination.  Being monitored at the hospital until you pass the stone through urination.  Increasing your fluid intake and decreasing the amount of calcium and protein in your diet.  A procedure to break up kidney stones in the bladder using: ? A focused beam of light (laser therapy). ? Shock waves (extracorporeal shock wave lithotripsy).  Surgery to remove kidney stones. This may be needed if you have severe pain or have stones that block your urinary tract. Follow these instructions at home: Eating and drinking  Drink enough fluid to keep your urine clear or pale yellow. This will help you to pass the kidney stone.  If directed, change your diet. This may include: ? Limiting how much sodium you eat. ? Eating more fruits and vegetables. ? Limiting how much meat, poultry, fish, and eggs you eat.  Follow instructions from your health care provider about eating or drinking restrictions. General instructions  Collect urine samples as told by your health care provider. You may need to collect a urine sample: ? 24 hours after you pass the stone. ? 8-12 weeks after passing the kidney stone, and every 6-12 months after that.  Strain your urine every time you urinate, for as long as directed. Use the strainer that your health care provider recommends.  Do not throw out the kidney stone after passing  it. Keep the stone so it can be tested by your health care provider. Testing the makeup of your kidney stone may help prevent you from getting kidney stones in the future.  Take over-the-counter and prescription medicines only as told by your health care provider.  Keep all follow-up visits as told by your health care provider. This is important. You may need follow-up X-rays or  ultrasounds to make sure that your stone has passed. How is this prevented? To prevent another kidney stone:  Drink enough fluid to keep your urine clear or pale yellow. This is the best way to prevent kidney stones.  Eat a healthy diet and follow recommendations from your health care provider about foods to avoid. You may be instructed to eat a low-protein diet. Recommendations vary depending on the type of kidney stone that you have.  Maintain a healthy weight. Contact a health care provider if:  You have pain that gets worse or does not get better with medicine. Get help right away if:  You have a fever or chills.  You develop severe pain.  You develop new abdominal pain.  You faint.  You are unable to urinate. This information is not intended to replace advice given to you by your health care provider. Make sure you discuss any questions you have with your health care provider. Document Released: 07/25/2005 Document Revised: 01/05/2017 Document Reviewed: 01/08/2016 Elsevier Interactive Patient Education  2019 [employer] After This sheet gives you information about how to care for yourself after your procedure. Your health care provider may also give you more specific instructions. If you have problems or questions, contact your health care provider. What can I expect after the procedure? After the procedure, it is common to have:  Some blood in your urine. This should only last for a few days.  Soreness in your back, sides, or upper abdomen for a few days.  Blotches or bruises on your back where the pressure wave entered the skin.  Pain, discomfort, or nausea when pieces (fragments) of the kidney stone move through the tube that carries urine from the kidney to the bladder (ureter). Stone fragments may pass soon after the procedure, but they may continue to pass for up to 4-8 weeks. ? If you have severe pain or nausea, contact your health care  provider. This may be caused by a large stone that was not broken up, and this may mean that you need more treatment.  Some pain or discomfort during urination.  Some pain or discomfort in the lower abdomen or (in men) at the base of the penis. Follow these instructions at home: Medicines  Take over-the-counter and prescription medicines only as told by your health care provider.  If you were prescribed an antibiotic medicine, take it as told by your health care provider. Do not stop taking the antibiotic even if you start to feel better.  Do not drive for 24 hours if you were given a medicine to help you relax (sedative).  Do not drive or use heavy machinery while taking prescription pain medicine. Eating and drinking      Drink enough water and fluids to keep your urine clear or pale yellow. This helps any remaining pieces of the stone to pass. It can also help prevent new stones from forming.  Eat plenty of fresh fruits and vegetables.  Follow instructions from your health care provider about eating and drinking restrictions. You may be instructed: ? To reduce  how much salt (sodium) you eat or drink. Check ingredients and nutrition facts on packaged foods and beverages. ? To reduce how much meat you eat.  Eat the recommended amount of calcium for your age and gender. Ask your health care provider how much calcium you should have. General instructions  Get plenty of rest.  Most people can resume normal activities 1-2 days after the procedure. Ask your health care provider what activities are safe for you.  Your health care provider may direct you to lie in a certain position (postural drainage) and tap firmly (percuss) over your kidney area to help stone fragments pass. Follow instructions as told by your health care provider.  If directed, strain all urine through the strainer that was provided by your health care provider. ? Keep all fragments for your health care provider  to see. Any stones that are found may be sent to a medical lab for examination. The stone may be as small as a grain of salt.  Keep all follow-up visits as told by your health care provider. This is important. Contact a health care provider if:  You have pain that is severe or does not get better with medicine.  You have nausea that is severe or does not go away.  You have blood in your urine longer than your health care provider told you to expect.  You have more blood in your urine.  You have pain during urination that does not go away.  You urinate more frequently than usual and this does not go away.  You develop a rash or any other possible signs of an allergic reaction. Get help right away if:  You have severe pain in your back, sides, or upper abdomen.  You have severe pain while urinating.  Your urine is very dark red.  You have blood in your stool (feces).  You cannot pass any urine at all.  You feel a strong urge to urinate after emptying your bladder.  You have a fever or chills.  You develop shortness of breath, difficulty breathing, or chest pain.  You have severe nausea that leads to persistent vomiting.  You faint. Summary  After this procedure, it is common to have some pain, discomfort, or nausea when pieces (fragments) of the kidney stone move through the tube that carries urine from the kidney to the bladder (ureter). If this pain or nausea is severe, however, you should contact your health care provider.  Most people can resume normal activities 1-2 days after the procedure. Ask your health care provider what activities are safe for you.  Drink enough water and fluids to keep your urine clear or pale yellow. This helps any remaining pieces of the stone to pass, and it can help prevent new stones from forming.  If directed, strain your urine and keep all fragments for your health care provider to see. Fragments or stones may be as small as a grain of  salt.  Get help right away if you have severe pain in your back, sides, or upper abdomen or have severe pain while urinating. This information is not intended to replace advice given to you by your health care provider. Make sure you discuss any questions you have with your health care provider. Document Released: 08/14/2007 Document Revised: 01/03/2018 Document Reviewed: 06/15/2016 Elsevier Interactive Patient Education  2019 [employer].   Lithotripsy  Lithotripsy is a treatment that can sometimes help eliminate kidney stones and the pain that they cause. A form  of lithotripsy, also known as extracorporeal shock wave lithotripsy, is a nonsurgical procedure that crushes a kidney stone with shock waves. These shock waves pass through your body and focus on the kidney stone. They cause the kidney stone to break up while it is still in the urinary tract. This makes it easier for the smaller pieces of stone to pass in the urine. Tell a health care provider about:  Any allergies you have.  All medicines you are taking, including vitamins, herbs, eye drops, creams, and over-the-counter medicines.  Any blood disorders you have.  Any surgeries you have had.  Any medical conditions you have.  Whether you are pregnant or may be pregnant.  Any problems you or family members have had with anesthetic medicines. What are the risks? Generally, this is a safe procedure. However, problems may occur, including:  Infection.  Bleeding of the kidney.  Bruising of the kidney or skin.  Scarring of the kidney, which can lead to: ? Increased blood pressure. ? Poor kidney function. ? Return (recurrence) of kidney stones.  Damage to other structures or organs, such as the liver, colon, spleen, or pancreas.  Blockage (obstruction) of the the tube that carries urine from the kidney to the bladder (ureter).  Failure of the kidney stone to break into pieces (fragments). What happens before the  procedure? Staying hydrated Follow instructions from your health care provider about hydration, which may include:  Up to 2 hours before the procedure - you may continue to drink clear liquids, such as water, clear fruit juice, black coffee, and plain tea. Eating and drinking restrictions Follow instructions from your health care provider about eating and drinking, which may include:  8 hours before the procedure - stop eating heavy meals or foods such as meat, fried foods, or fatty foods.  6 hours before the procedure - stop eating light meals or foods, such as toast or cereal.  6 hours before the procedure - stop drinking milk or drinks that contain milk.  2 hours before the procedure - stop drinking clear liquids. General instructions  Plan to have someone take you home from the hospital or clinic.  Ask your health care provider about: ? Changing or stopping your regular medicines. This is especially important if you are taking diabetes medicines or blood thinners. ? Taking medicines such as aspirin and ibuprofen. These medicines and other NSAIDs can thin your blood. Do not take these medicines for 7 days before your procedure if your health care provider instructs you not to.  You may have tests, such as: ? Blood tests. ? Urine tests. ? Imaging tests, such as a CT scan. What happens during the procedure?  To lower your risk of infection: ? Your health care team will wash or sanitize their hands. ? Your skin will be washed with soap.  An IV tube will be inserted into one of your veins. This tube will give you fluids and medicines.  You will be given one or more of the following: ? A medicine to help you relax (sedative). ? A medicine to make you fall asleep (general anesthetic).  A water-filled cushion may be placed behind your kidney or on your abdomen. In some cases you may be placed in a tub of lukewarm water.  Your body will be positioned in a way that makes it easy to  target the kidney stone.  A flexible tube with holes in it (stent) may be placed in the ureter. This will help keep  urine flowing from the kidney if the fragments of the stone have been blocking the ureter.  An X-ray or ultrasound exam will be done to locate your stone.  Shock waves will be aimed at the stone. If you are awake, you may feel a tapping sensation as the shock waves pass through your body. The procedure may vary among health care providers and hospitals. What happens after the procedure?  You may have an X-ray to see whether the procedure was able to break up the kidney stone and how much of the stone has passed. If large stone fragments remain after treatment, you may need to have a second procedure at a later time.  Your blood pressure, heart rate, breathing rate, and blood oxygen level will be monitored until the medicines you were given have worn off.  You may be given antibiotics or pain medicine as needed.  If a stent was placed in your ureter during surgery, it may stay in place for a few weeks.  You may need strain your urine to collect pieces of the kidney stone for testing.  You will need to drink plenty of water.  Do not drive for 24 hours if you were given a sedative. Summary  Lithotripsy is a treatment that can sometimes help eliminate kidney stones and the pain that they cause.  A form of lithotripsy, also known as extracorporeal shock wave lithotripsy, is a nonsurgical procedure that crushes a kidney stone with shock waves.  Generally, this is a safe procedure. However, problems may occur, including damage to the kidney or other organs, infection, or obstruction of the tube that carries urine from the kidney to the bladder (ureter).  When you go home, you will need to drink plenty of water. You may be asked to strain your urine to collect pieces of the kidney stone for testing. This information is not intended to replace advice given to you by your health  care provider. Make sure you discuss any questions you have with your health care provider. Document Released: 07/22/2000 Document Revised: 08/10/2017 Document Reviewed: 06/15/2016 Elsevier Interactive Patient Education  2019 [employer].

## 2018-10-20 ENCOUNTER — Emergency Department
Admission: EM | Admit: 2018-10-20 | Discharge: 2018-10-20 | Disposition: A | Discharge status: Home / Self Care | Payer: Medicare Other | Attending: Emergency Medicine | Admitting: Emergency Medicine

## 2018-10-20 ENCOUNTER — Emergency Department: Payer: Medicare Other

## 2018-10-20 ENCOUNTER — Encounter: Payer: Self-pay | Admitting: Emergency Medicine

## 2018-10-20 ENCOUNTER — Other Ambulatory Visit: Payer: Self-pay

## 2018-10-20 DIAGNOSIS — N189 Chronic kidney disease, unspecified: Secondary | ICD-10-CM | POA: Insufficient documentation

## 2018-10-20 DIAGNOSIS — N201 Calculus of ureter: Secondary | ICD-10-CM | POA: Insufficient documentation

## 2018-10-20 DIAGNOSIS — Z8546 Personal history of malignant neoplasm of prostate: Secondary | ICD-10-CM | POA: Insufficient documentation

## 2018-10-20 DIAGNOSIS — Z79899 Other long term (current) drug therapy: Secondary | ICD-10-CM | POA: Insufficient documentation

## 2018-10-20 DIAGNOSIS — R109 Unspecified abdominal pain: Secondary | ICD-10-CM | POA: Diagnosis not present

## 2018-10-20 DIAGNOSIS — R110 Nausea: Secondary | ICD-10-CM | POA: Diagnosis not present

## 2018-10-20 DIAGNOSIS — R1031 Right lower quadrant pain: Secondary | ICD-10-CM | POA: Diagnosis not present

## 2018-10-20 LAB — URINALYSIS, COMPLETE (UACMP) WITH MICROSCOPIC
BILIRUBIN URINE: NEGATIVE
Bacteria, UA: NONE SEEN
Glucose, UA: NEGATIVE mg/dL
Ketones, ur: 20 mg/dL — AB
Leukocytes,Ua: NEGATIVE
Nitrite: NEGATIVE
Protein, ur: 30 mg/dL — AB
RBC / HPF: >50 RBC/hpf — ABNORMAL HIGH (ref 0–5)
Specific Gravity, Urine: 1.024 (ref 1.005–1.030)
Squamous Epithelial / HPF: NONE SEEN (ref 0–5)
pH: 6 (ref 5.0–8.0)

## 2018-10-20 LAB — CBC
HCT: 46.8 % (ref 39.0–52.0)
Hemoglobin: 15.8 g/dL (ref 13.0–17.0)
MCH: 30.5 pg (ref 26.0–34.0)
MCHC: 33.8 g/dL (ref 30.0–36.0)
MCV: 90.3 fL (ref 80.0–100.0)
PLATELETS: 254 10*3/uL (ref 150–400)
RBC: 5.18 MIL/uL (ref 4.22–5.81)
RDW: 12.4 % (ref 11.5–15.5)
WBC: 15.9 10*3/uL — ABNORMAL HIGH (ref 4.0–10.5)
nRBC: 0 % (ref 0.0–0.2)

## 2018-10-20 LAB — BASIC METABOLIC PANEL
Anion gap: 10 (ref 5–15)
BUN: 22 mg/dL (ref 8–23)
CALCIUM: 9.3 mg/dL (ref 8.9–10.3)
CO2: 25 mmol/L (ref 22–32)
Chloride: 108 mmol/L (ref 98–111)
Creatinine, Ser: 1.35 mg/dL — ABNORMAL HIGH (ref 0.61–1.24)
GFR calc Af Amer: >60 mL/min (ref >60)
GFR calc non Af Amer: 53 mL/min — ABNORMAL LOW (ref >60)
Glucose, Bld: 107 mg/dL — ABNORMAL HIGH (ref 70–99)
Potassium: 3.8 mmol/L (ref 3.5–5.1)
Sodium: 143 mmol/L (ref 135–145)

## 2018-10-20 MED ORDER — TAMSULOSIN HCL 0.4 MG PO CAPS: 0.4000 mg | ORAL_CAPSULE | Freq: Every day | ORAL | 0 refills | Status: DC

## 2018-10-20 MED ORDER — OXYCODONE HCL 5 MG PO TABS: 5.0000 mg | ORAL_TABLET | Freq: Three times a day (TID) | ORAL | 0 refills | Status: DC | PRN

## 2018-10-20 MED ORDER — FENTANYL CITRATE (PF) 100 MCG/2ML IJ SOLN
50.0000 ug | INTRAMUSCULAR | Status: DC | PRN
  Administered 2018-10-20: 50 ug via INTRAVENOUS
  Filled 2018-10-20: qty 2

## 2018-10-20 MED ORDER — OXYCODONE-ACETAMINOPHEN 5-325 MG PO TABS
2.0000 | ORAL_TABLET | Freq: Once | ORAL | Status: AC
  Administered 2018-10-20: 2 via ORAL
  Filled 2018-10-20: qty 2

## 2018-10-20 MED ORDER — ONDANSETRON HCL 4 MG/2ML IJ SOLN
4.0000 mg | Freq: Once | INTRAMUSCULAR | Status: AC
  Administered 2018-10-20: 4 mg via INTRAVENOUS
  Filled 2018-10-20: qty 2

## 2018-10-20 MED ORDER — SODIUM CHLORIDE 0.9 % IV SOLN
1000.0000 mL | Freq: Once | INTRAVENOUS | Status: AC
  Administered 2018-10-20: 1000 mL via INTRAVENOUS

## 2018-10-20 MED ORDER — KETOROLAC TROMETHAMINE 30 MG/ML IJ SOLN
15.0000 mg | Freq: Once | INTRAMUSCULAR | Status: AC
  Administered 2018-10-20: 15 mg via INTRAVENOUS
  Filled 2018-10-20: qty 1

## 2018-10-20 NOTE — ED Notes (Addendum)
Pt states R flank pain. States had lithotripsy on Thursday. Extensive hx of kidney stones. States hematuria. States N&V. A&O, ambulatory. No distress noted. Denies fever.

## 2018-10-20 NOTE — ED Provider Notes (Signed)
[facility] Emergency Department Provider Note   ____________________________________________    I have reviewed the triage vital signs and the nursing notes.   HISTORY  Chief Complaint Flank Pain     HPI Elijah Taylor is a 68 y.o. male who presents with complaints of right-sided flank pain.  Patient had lithotripsy performed on Thursday, 3 days ago, had been doing well until this morning when he describes severe right-sided flank pain consistent with kidney stone pain.  Denies fevers or chills.  No dysuria.  Positive nausea, no vomiting.  Does not take anything for this.  Past Medical History:  Diagnosis Date  . Arthritis   . Chronic kidney disease    H/O KIDNEY STONES  . GERD (gastroesophageal reflux disease)   . History of kidney stones   . Insomnia   . Prostate cancer ([place])   . 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 Wears glasses     Patient Active Problem List   Diagnosis Date Noted  . Advanced care planning/counseling discussion 10/30/2017  . Trigger middle finger of right hand 05/10/2017  . Actinic keratosis 03/01/2017  . Solar dermatitis 03/01/2017  . Prostate cancer ([place]) 10/06/2016  . Fatigue 10/06/2016  . Hypercholesteremia 10/06/2016    Past Surgical History:  Procedure Laterality Date  . CARPAL TUNNEL RELEASE     left  . CARPAL TUNNEL RELEASE Right 12/01/2014   Procedure: RIGHT CARPAL TUNNEL RELEASE;  Surgeon: Daryll Brod, MD;  Location: [place];  Service: Orthopedics;  Laterality: Right;  . CERVICAL FUSION  2007  . COLONOSCOPY    . COLONOSCOPY WITH PROPOFOL N/A 11/07/2017   Procedure: COLONOSCOPY WITH PROPOFOL;  Surgeon: [name], MD;  Location: [facility] ENDOSCOPY;  Service: Gastroenterology;  Laterality: N/A;  . CYSTOSCOPY MACROPLASTIQUE IMPLANT N/A 09/15/2015   Procedure: CYSTOSCOPY MACROPLASTIQUE IMPLANT;  Surgeon: Royston Cowper, MD;  Location: [facility] ORS;  Service: Urology;   Laterality: N/A;  . CYSTOSCOPY/URETEROSCOPY/HOLMIUM LASER/STENT PLACEMENT Left 06/29/2018   Procedure: CYSTOSCOPY/URETEROSCOPY/HOLMIUM LASER/STENT PLACEMENT;  Surgeon: Billey Co, MD;  Location: [facility] OR[facility];  Service: Urology;  Laterality: Left;  . EXTRACORPOREAL SHOCK WAVE LITHOTRIPSY Left 07/12/2018   Procedure: EXTRACORPOREAL SHOCK WAVE LITHOTRIPSY (ESWL);  Surgeon: Billey Co, MD;  Location: [facility] ORS;  Service: Urology;  Laterality: Left;  . EXTRACORPOREAL SHOCK WAVE LITHOTRIPSY Right 10/18/2018   Procedure: EXTRACORPOREAL SHOCK WAVE LITHOTRIPSY (ESWL);  Surgeon: Royston Cowper, MD;  Location: [facility] ORS;  Service: Urology;  Laterality: Right;  . RETROPUBIC PROSTATECTOMY  2010  . SHOULDER ARTHROSCOPY W/ ROTATOR CUFF REPAIR     right and left  . TRIGGER FINGER RELEASE     x 4  . TRIGGER FINGER RELEASE Left 09/02/2014   Procedure: RELEASE A-1 PULLEY LEFT INDEX FINGER;  Surgeon: Daryll Brod, MD;  Location: [place];  Service: Orthopedics;  Laterality: Left;  ANESTHESIA: IV REGIONAL FAB  . TRIGGER FINGER RELEASE Right 05/16/2017   Procedure: RELEASE TRIGGER FINGER/A-1 PULLEY RIGHT MIDDLE FINGER;  Surgeon: Daryll Brod, MD;  Location: [place];  Service: Orthopedics;  Laterality: Right;    Prior to Admission medications   Medication Sig Start Date End Date Taking? Authorizing Provider  acetaminophen-codeine (TYLENOL #3) 300-30 MG tablet Take 1-2 tablets by mouth every 4 (four) hours as needed for moderate pain. 10/18/18   Royston Cowper, MD  Besifloxacin HCl (BESIVANCE) 0.6 % SUSP Place 1 drop into the right eye 3 (three) times daily.  [provider]  Bromfenac Sodium (PROLENSA) 0.07 % SOLN Place 1 drop into the right eye at bedtime.    [provider]  Difluprednate (DUREZOL) 0.05 % EMUL Place 1 drop into the right eye 3 (three) times daily. 06/21/18   [provider]  diphenhydramine-acetaminophen (TYLENOL PM) 25-500 MG TABS  tablet Take 1 tablet by mouth at bedtime as needed (for sleep).     [provider]  Multiple Vitamins-Minerals (MULTIVITAMIN WITH MINERALS) tablet Take 1 tablet by mouth daily.    [provider]  ondansetron (ZOFRAN ODT) 8 MG disintegrating tablet Take 0.5 tablets (4 mg total) by mouth every 6 (six) hours as needed for nausea or vomiting. 10/18/18   Royston Cowper, MD  oxyCODONE (ROXICODONE) 5 MG immediate release tablet Take 1 tablet (5 mg total) by mouth every 8 (eight) hours as needed. 10/20/18 10/20/19  Lavonia Drafts, MD  Pseudoeph-Doxylamine-DM-APAP (NYQUIL PO) Take 5 mLs by mouth at bedtime as needed (for sleep).    [provider]  sulfamethoxazole-trimethoprim (BACTRIM DS,SEPTRA DS) 800-160 MG tablet Take 1 tablet by mouth 2 (two) times daily. Start taking 2 days prior to stent removal. Patient not taking: Reported on 10/18/2018 07/12/18   Billey Co, MD  tamsulosin (FLOMAX) 0.4 MG CAPS capsule Take 1 capsule (0.4 mg total) by mouth daily. 10/20/18   Lavonia Drafts, MD  triazolam (HALCION) 0.25 MG tablet Take 0.25 mg by mouth at bedtime as needed for sleep.  03/06/15   [provider]     Allergies Patient has no known allergies.  Family History  Problem Relation Age of Onset  . Cancer Mother        Brain Tumor  . Kidney failure Father   . Prostate cancer Father   . Heart disease Brother   . Heart attack Maternal Grandfather   . Prostate cancer Maternal Uncle   . Prostate cancer Maternal Uncle     Social History Social History   Tobacco Use  . Smoking status: Never Smoker  . Smokeless tobacco: Never Used  Substance Use Topics  . Alcohol use: Yes    Alcohol/week: 1.0 standard drinks    Types: 1 Glasses of wine per week    Comment: occasionally  . Drug use: No    Review of Systems  Constitutional: No fever/chills Eyes: No visual changes.  ENT: No sore throat. Cardiovascular: Denies chest pain. Respiratory: Denies shortness of  breath. Gastrointestinal: As above Genitourinary: As above Musculoskeletal: Negative for back pain. Skin: Negative for rash. Neurological: Negative for headache   ____________________________________________   PHYSICAL EXAM:  VITAL SIGNS: ED Triage Vitals  Enc Vitals Group     BP 10/20/18 1212 (!) 184/89     Pulse Rate 10/20/18 1212 (!) 111     Resp 10/20/18 1212 20     Temp 10/20/18 1212 98.9 F (37.2 C)     Temp Source 10/20/18 1212 Oral     SpO2 10/20/18 1212 97 %     Weight 10/20/18 1213 81.6 kg (180 lb)     Height 10/20/18 1213 1.778 m (5\' 10" )     Head Circumference --      Peak Flow --      Pain Score 10/20/18 1213 10     Pain Loc --      Pain Edu? --      Excl. in [place]? --     Constitutional: Alert and oriented Eyes: Conjunctivae are normal.   Nose: No congestion/rhinnorhea. Mouth/Throat:  Mucous membranes are moist.    Cardiovascular: Normal rate, regular rhythm. Grossly normal heart sounds.  Good peripheral circulation. Respiratory: Normal respiratory effort.  No retractions. Lungs CTAB. Gastrointestinal: Soft and nontender. No distention.  No CVA tenderness.  Musculoskeletal: No lower extremity tenderness nor edema.  Warm and well perfused Neurologic:  Normal speech and language. No gross focal neurologic deficits are appreciated.  Skin:  Skin is warm, dry and intact. No rash noted. Psychiatric: Mood and affect are normal. Speech and behavior are normal.  ____________________________________________   LABS (all labs ordered are listed, but only abnormal results are displayed)  Labs Reviewed  URINALYSIS, COMPLETE (UACMP) WITH MICROSCOPIC - Abnormal; Notable for the following components:      Result Value   Color, Urine YELLOW (*)    APPearance HAZY (*)    Hgb urine dipstick LARGE (*)    Ketones, ur 20 (*)    Protein, ur 30 (*)    RBC / HPF >50 (*)    All other components within normal limits  BASIC METABOLIC PANEL - Abnormal; Notable for the  following components:   Glucose, Bld 107 (*)    Creatinine, Ser 1.35 (*)    GFR calc non Af Amer 53 (*)    All other components within normal limits  CBC - Abnormal; Notable for the following components:   WBC 15.9 (*)    All other components within normal limits  URINE CULTURE   ____________________________________________  EKG  None ____________________________________________  RADIOLOGY  KUB ____________________________________________   PROCEDURES  Procedure(s) performed: No  Procedures   Critical Care performed: No ____________________________________________   INITIAL IMPRESSION / ASSESSMENT AND PLAN / ED COURSE  Pertinent labs & imaging results that were available during my care of the patient were reviewed by me and considered in my medical decision making (see chart for details).  Patient presents with severe right flank pain after lithotripsy, suspect ureterolithiasis, treated with IV fentanyl in the emergency department, still has some pain, we will add IV Toradol.  Pending KUB, labs, urinalysis   Pain significantly improved after Toradol.  Lab work is overall reassuring, elevated white blood cell count likely related to pain/discomfort, no bacteria seen on urinalysis, no nitrites, afebrile.  He has close follow-up with urology.  Analgesics prescribed, return precautions discussed    ____________________________________________   FINAL CLINICAL IMPRESSION(S) / ED DIAGNOSES  Final diagnoses:  Ureterolithiasis        Note:  This document was prepared using Dragon voice recognition software and may include unintentional dictation errors.   Lavonia Drafts, MD 10/20/18 [phone]

## 2018-10-20 NOTE — ED Triage Notes (Signed)
Pt iva pov from home. Had lithotripsy on Thursday and has had some particles pass, but has had constant pain since 0730 today. Pt alert & oriented, is unable to stay still due to the pain.

## 2018-10-20 NOTE — ED Notes (Signed)
Pt returned from xray

## 2018-10-22 LAB — URINE CULTURE: Culture: NO GROWTH

## 2018-11-01 DIAGNOSIS — N23 Unspecified renal colic: Secondary | ICD-10-CM | POA: Diagnosis not present

## 2018-11-01 DIAGNOSIS — D400 Neoplasm of uncertain behavior of prostate: Secondary | ICD-10-CM | POA: Diagnosis not present

## 2018-11-01 DIAGNOSIS — C61 Malignant neoplasm of prostate: Secondary | ICD-10-CM | POA: Diagnosis not present

## 2018-11-01 DIAGNOSIS — N200 Calculus of kidney: Secondary | ICD-10-CM | POA: Diagnosis not present

## 2018-11-05 ENCOUNTER — Ambulatory Visit: Payer: Medicare Other | Admitting: Family Medicine

## 2018-11-07 DIAGNOSIS — N200 Calculus of kidney: Secondary | ICD-10-CM | POA: Diagnosis not present

## 2018-11-07 DIAGNOSIS — C61 Malignant neoplasm of prostate: Secondary | ICD-10-CM | POA: Diagnosis not present

## 2018-11-07 DIAGNOSIS — N23 Unspecified renal colic: Secondary | ICD-10-CM | POA: Diagnosis not present

## 2018-11-07 DIAGNOSIS — D400 Neoplasm of uncertain behavior of prostate: Secondary | ICD-10-CM | POA: Diagnosis not present

## 2018-11-21 DIAGNOSIS — R1012 Left upper quadrant pain: Secondary | ICD-10-CM | POA: Diagnosis not present

## 2018-11-23 DIAGNOSIS — N201 Calculus of ureter: Secondary | ICD-10-CM | POA: Diagnosis not present

## 2018-11-23 DIAGNOSIS — C61 Malignant neoplasm of prostate: Secondary | ICD-10-CM | POA: Diagnosis not present

## 2018-11-23 DIAGNOSIS — D400 Neoplasm of uncertain behavior of prostate: Secondary | ICD-10-CM | POA: Diagnosis not present

## 2018-11-29 NOTE — Telephone Encounter (Signed)
error 

## 2018-12-14 ENCOUNTER — Ambulatory Visit: Payer: Medicare Other | Admitting: Internal Medicine

## 2019-01-22 ENCOUNTER — Ambulatory Visit: Payer: Medicare Other | Admitting: Urology

## 2019-02-11 DIAGNOSIS — Z20828 Contact with and (suspected) exposure to other viral communicable diseases: Secondary | ICD-10-CM | POA: Diagnosis not present

## 2019-02-11 DIAGNOSIS — J069 Acute upper respiratory infection, unspecified: Secondary | ICD-10-CM | POA: Diagnosis not present

## 2019-02-11 DIAGNOSIS — J189 Pneumonia, unspecified organism: Secondary | ICD-10-CM | POA: Diagnosis not present

## 2019-03-11 ENCOUNTER — Other Ambulatory Visit: Payer: Self-pay

## 2019-03-18 ENCOUNTER — Other Ambulatory Visit: Payer: Self-pay | Admitting: Orthopedic Surgery

## 2019-03-18 DIAGNOSIS — M65321 Trigger finger, right index finger: Secondary | ICD-10-CM | POA: Diagnosis not present

## 2019-03-21 ENCOUNTER — Other Ambulatory Visit: Payer: Self-pay

## 2019-03-21 ENCOUNTER — Encounter (HOSPITAL_BASED_OUTPATIENT_CLINIC_OR_DEPARTMENT_OTHER): Payer: Self-pay | Admitting: *Deleted

## 2019-03-25 ENCOUNTER — Other Ambulatory Visit (HOSPITAL_COMMUNITY)
Admission: RE | Admit: 2019-03-25 | Discharge: 2019-03-25 | Disposition: A | Discharge status: Home / Self Care | Payer: Medicare Other | Source: Ambulatory Visit | Attending: Orthopedic Surgery | Admitting: Orthopedic Surgery

## 2019-03-25 DIAGNOSIS — Z20828 Contact with and (suspected) exposure to other viral communicable diseases: Secondary | ICD-10-CM | POA: Diagnosis not present

## 2019-03-25 DIAGNOSIS — Z01812 Encounter for preprocedural laboratory examination: Secondary | ICD-10-CM | POA: Diagnosis not present

## 2019-03-25 LAB — SARS CORONAVIRUS 2 (TAT 6-24 HRS): SARS Coronavirus 2: NEGATIVE

## 2019-03-27 NOTE — Anesthesia Preprocedure Evaluation (Addendum)
Anesthesia Evaluation  Patient identified by MRN, date of birth, ID band Patient awake    Reviewed: Allergy & Precautions, NPO status , Patient's Chart, lab work & pertinent test results  Airway Mallampati: I  TM Distance: >3 FB Neck ROM: Full    Dental no notable dental hx.    Pulmonary neg pulmonary ROS,    Pulmonary exam normal breath sounds clear to auscultation       Cardiovascular negative cardio ROS Normal cardiovascular exam Rhythm:Regular Rate:Normal     Neuro/Psych negative neurological ROS  negative psych ROS   GI/Hepatic negative GI ROS, Neg liver ROS,   Endo/Other  negative endocrine ROS  Renal/GU negative Renal ROS     Musculoskeletal negative musculoskeletal ROS (+)   Abdominal   Peds  Hematology negative hematology ROS (+)   Anesthesia Other Findings TRIGGER RIGHT INDEX  Reproductive/Obstetrics                            Anesthesia Physical Anesthesia Plan  ASA: II  Anesthesia Plan: Bier Block and Bier Block-LIDOCAINE ONLY   Post-op Pain Management:    Induction:   PONV Risk Score and Plan: 1 and Propofol infusion, Treatment may vary due to age or medical condition, Ondansetron, Dexamethasone and Midazolam  Airway Management Planned: Simple Face Mask  Additional Equipment:   Intra-op Plan:   Post-operative Plan:   Informed Consent: I have reviewed the patients History and Physical, chart, labs and discussed the procedure including the risks, benefits and alternatives for the proposed anesthesia with the patient or authorized representative who has indicated his/her understanding and acceptance.     Dental advisory given  Plan Discussed with: CRNA  Anesthesia Plan Comments:        Anesthesia Quick Evaluation

## 2019-03-28 ENCOUNTER — Other Ambulatory Visit: Payer: Self-pay

## 2019-03-28 ENCOUNTER — Encounter (HOSPITAL_BASED_OUTPATIENT_CLINIC_OR_DEPARTMENT_OTHER): Payer: Self-pay | Admitting: Certified Registered"

## 2019-03-28 ENCOUNTER — Ambulatory Visit (HOSPITAL_BASED_OUTPATIENT_CLINIC_OR_DEPARTMENT_OTHER): Payer: Medicare Other | Admitting: Anesthesiology

## 2019-03-28 ENCOUNTER — Ambulatory Visit (HOSPITAL_BASED_OUTPATIENT_CLINIC_OR_DEPARTMENT_OTHER)
Admission: RE | Admit: 2019-03-28 | Discharge: 2019-03-28 | Disposition: A | Discharge status: Home / Self Care | Payer: Medicare Other | Attending: Orthopedic Surgery | Admitting: Orthopedic Surgery

## 2019-03-28 ENCOUNTER — Encounter (HOSPITAL_BASED_OUTPATIENT_CLINIC_OR_DEPARTMENT_OTHER)
Admission: RE | Disposition: A | Discharge status: Home / Self Care | Payer: Self-pay | Source: Home / Self Care | Attending: Orthopedic Surgery

## 2019-03-28 DIAGNOSIS — M1990 Unspecified osteoarthritis, unspecified site: Secondary | ICD-10-CM | POA: Insufficient documentation

## 2019-03-28 DIAGNOSIS — Z8546 Personal history of malignant neoplasm of prostate: Secondary | ICD-10-CM | POA: Diagnosis not present

## 2019-03-28 DIAGNOSIS — M65321 Trigger finger, right index finger: Secondary | ICD-10-CM | POA: Diagnosis not present

## 2019-03-28 DIAGNOSIS — M6588 Other synovitis and tenosynovitis, other site: Secondary | ICD-10-CM | POA: Diagnosis not present

## 2019-03-28 DIAGNOSIS — M65841 Other synovitis and tenosynovitis, right hand: Secondary | ICD-10-CM | POA: Insufficient documentation

## 2019-03-28 DIAGNOSIS — Z833 Family history of diabetes mellitus: Secondary | ICD-10-CM | POA: Diagnosis not present

## 2019-03-28 DIAGNOSIS — Z87442 Personal history of urinary calculi: Secondary | ICD-10-CM | POA: Diagnosis not present

## 2019-03-28 DIAGNOSIS — R7303 Prediabetes: Secondary | ICD-10-CM | POA: Insufficient documentation

## 2019-03-28 DIAGNOSIS — N189 Chronic kidney disease, unspecified: Secondary | ICD-10-CM | POA: Diagnosis not present

## 2019-03-28 DIAGNOSIS — E7800 Pure hypercholesterolemia, unspecified: Secondary | ICD-10-CM | POA: Diagnosis not present

## 2019-03-28 HISTORY — PX: TRIGGER FINGER RELEASE: SHX641

## 2019-03-28 SURGERY — RELEASE, A1 PULLEY, FOR TRIGGER FINGER
Anesthesia: Regional | Site: Hand | Laterality: Right

## 2019-03-28 MED ORDER — TRAMADOL HCL 50 MG PO TABS: 50.0000 mg | ORAL_TABLET | Freq: Four times a day (QID) | ORAL | 0 refills | Status: DC | PRN

## 2019-03-28 MED ORDER — CEFAZOLIN SODIUM-DEXTROSE 2-4 GM/100ML-% IV SOLN
2.0000 g | INTRAVENOUS | Status: AC
  Administered 2019-03-28: 2 g via INTRAVENOUS

## 2019-03-28 MED ORDER — ACETAMINOPHEN 500 MG PO TABS
ORAL_TABLET | ORAL | Status: AC
  Filled 2019-03-28: qty 2

## 2019-03-28 MED ORDER — LACTATED RINGERS IV SOLN
INTRAVENOUS | Status: DC
  Administered 2019-03-28: 08:00:00 via INTRAVENOUS

## 2019-03-28 MED ORDER — LIDOCAINE HCL (PF) 0.5 % IJ SOLN
INTRAMUSCULAR | Status: DC | PRN
  Administered 2019-03-28: 30 mL via INTRAVENOUS

## 2019-03-28 MED ORDER — FENTANYL CITRATE (PF) 100 MCG/2ML IJ SOLN
50.0000 ug | INTRAMUSCULAR | Status: DC | PRN
  Administered 2019-03-28 (×2): 50 ug via INTRAVENOUS

## 2019-03-28 MED ORDER — FENTANYL CITRATE (PF) 100 MCG/2ML IJ SOLN
INTRAMUSCULAR | Status: AC
  Filled 2019-03-28: qty 2

## 2019-03-28 MED ORDER — ONDANSETRON HCL 4 MG/2ML IJ SOLN
INTRAMUSCULAR | Status: DC | PRN
  Administered 2019-03-28: 4 mg via INTRAVENOUS

## 2019-03-28 MED ORDER — CEFAZOLIN SODIUM-DEXTROSE 2-4 GM/100ML-% IV SOLN
INTRAVENOUS | Status: AC
  Filled 2019-03-28: qty 100

## 2019-03-28 MED ORDER — BUPIVACAINE HCL (PF) 0.25 % IJ SOLN
INTRAMUSCULAR | Status: AC
  Filled 2019-03-28: qty 120

## 2019-03-28 MED ORDER — MIDAZOLAM HCL 2 MG/2ML IJ SOLN: 1.0000 mg | INTRAMUSCULAR | Status: DC | PRN

## 2019-03-28 MED ORDER — DEXAMETHASONE SODIUM PHOSPHATE 10 MG/ML IJ SOLN
INTRAMUSCULAR | Status: AC
  Filled 2019-03-28: qty 1

## 2019-03-28 MED ORDER — LIDOCAINE 2% (20 MG/ML) 5 ML SYRINGE
INTRAMUSCULAR | Status: AC
  Filled 2019-03-28: qty 25

## 2019-03-28 MED ORDER — CHLORHEXIDINE GLUCONATE 4 % EX LIQD: 60.0000 mL | Freq: Once | CUTANEOUS | Status: DC

## 2019-03-28 MED ORDER — ONDANSETRON HCL 4 MG/2ML IJ SOLN
INTRAMUSCULAR | Status: AC
  Filled 2019-03-28: qty 8

## 2019-03-28 MED ORDER — ACETAMINOPHEN 500 MG PO TABS
1000.0000 mg | ORAL_TABLET | Freq: Once | ORAL | Status: AC
  Administered 2019-03-28: 07:00:00 1000 mg via ORAL

## 2019-03-28 MED ORDER — PROPOFOL 500 MG/50ML IV EMUL
INTRAVENOUS | Status: AC
  Filled 2019-03-28: qty 50

## 2019-03-28 MED ORDER — ONDANSETRON HCL 4 MG/2ML IJ SOLN: 4.0000 mg | Freq: Once | INTRAMUSCULAR | Status: DC | PRN

## 2019-03-28 MED ORDER — SCOPOLAMINE 1 MG/3DAYS TD PT72: 1.0000 | MEDICATED_PATCH | Freq: Once | TRANSDERMAL | Status: DC

## 2019-03-28 MED ORDER — FENTANYL CITRATE (PF) 100 MCG/2ML IJ SOLN: 25.0000 ug | INTRAMUSCULAR | Status: DC | PRN

## 2019-03-28 MED ORDER — PROPOFOL 500 MG/50ML IV EMUL
INTRAVENOUS | Status: DC | PRN
  Administered 2019-03-28: 100 ug/kg/min via INTRAVENOUS

## 2019-03-28 MED ORDER — BUPIVACAINE HCL (PF) 0.25 % IJ SOLN
INTRAMUSCULAR | Status: DC | PRN
  Administered 2019-03-28: 4 mL

## 2019-03-28 SURGICAL SUPPLY — 36 items
APL PRP STRL LF DISP 70% ISPRP (MISCELLANEOUS) ×1
BLADE SURG 15 STRL LF DISP TIS (BLADE) ×1 IMPLANT
BLADE SURG 15 STRL SS (BLADE) ×3
BNDG CMPR 9X4 STRL LF SNTH (GAUZE/BANDAGES/DRESSINGS)
BNDG COHESIVE 2X5 TAN STRL LF (GAUZE/BANDAGES/DRESSINGS) ×3 IMPLANT
BNDG ESMARK 4X9 LF (GAUZE/BANDAGES/DRESSINGS) IMPLANT
CHLORAPREP W/TINT 26 (MISCELLANEOUS) ×3 IMPLANT
CORD BIPOLAR FORCEPS 12FT (ELECTRODE) IMPLANT
COVER BACK TABLE REUSABLE LG (DRAPES) ×3 IMPLANT
COVER MAYO STAND REUSABLE (DRAPES) ×3 IMPLANT
COVER WAND RF STERILE (DRAPES) IMPLANT
CUFF TOURN SGL QUICK 18X4 (TOURNIQUET CUFF) ×2 IMPLANT
DECANTER SPIKE VIAL GLASS SM (MISCELLANEOUS) IMPLANT
DRAPE EXTREMITY T 121X128X90 (DISPOSABLE) ×3 IMPLANT
DRAPE SURG 17X23 STRL (DRAPES) ×3 IMPLANT
GAUZE SPONGE 4X4 12PLY STRL (GAUZE/BANDAGES/DRESSINGS) ×3 IMPLANT
GAUZE XEROFORM 1X8 LF (GAUZE/BANDAGES/DRESSINGS) ×3 IMPLANT
GLOVE BIOGEL PI IND STRL 6.5 (GLOVE) IMPLANT
GLOVE BIOGEL PI IND STRL 8.5 (GLOVE) ×1 IMPLANT
GLOVE BIOGEL PI INDICATOR 6.5 (GLOVE) ×4
GLOVE BIOGEL PI INDICATOR 8.5 (GLOVE) ×2
GLOVE ECLIPSE 6.5 STRL STRAW (GLOVE) ×2 IMPLANT
GLOVE SURG ORTHO 8.0 STRL STRW (GLOVE) ×3 IMPLANT
GOWN STRL REUS W/ TWL LRG LVL3 (GOWN DISPOSABLE) ×1 IMPLANT
GOWN STRL REUS W/TWL LRG LVL3 (GOWN DISPOSABLE) ×3
GOWN STRL REUS W/TWL XL LVL3 (GOWN DISPOSABLE) ×3 IMPLANT
NDL PRECISIONGLIDE 27X1.5 (NEEDLE) ×1 IMPLANT
NEEDLE PRECISIONGLIDE 27X1.5 (NEEDLE) ×3 IMPLANT
NS IRRIG 1000ML POUR BTL (IV SOLUTION) ×3 IMPLANT
PACK BASIN DAY SURGERY FS (CUSTOM PROCEDURE TRAY) ×3 IMPLANT
STOCKINETTE 4X48 STRL (DRAPES) ×3 IMPLANT
SUT ETHILON 4 0 PS 2 18 (SUTURE) ×3 IMPLANT
SYR BULB 3OZ (MISCELLANEOUS) ×3 IMPLANT
SYR CONTROL 10ML LL (SYRINGE) ×3 IMPLANT
TOWEL GREEN STERILE FF (TOWEL DISPOSABLE) ×6 IMPLANT
UNDERPAD 30X30 (UNDERPADS AND DIAPERS) ×3 IMPLANT

## 2019-03-28 NOTE — Anesthesia Postprocedure Evaluation (Signed)
Anesthesia Post Note  Patient: Elijah Taylor  Procedure(s) Performed: RELEASE TRIGGER FINGER/A-1 PULLEY RIGHT INDEX (Right Hand)     Patient location during evaluation: PACU Anesthesia Type: Bier Block Level of consciousness: awake and alert Pain management: pain level controlled Vital Signs Assessment: post-procedure vital signs reviewed and stable Respiratory status: spontaneous breathing, nonlabored ventilation, respiratory function stable and patient connected to nasal cannula oxygen Cardiovascular status: stable and blood pressure returned to baseline Postop Assessment: no apparent nausea or vomiting Anesthetic complications: no    Last Vitals:  Vitals:   03/28/19 0917 03/28/19 0940  BP: 121/80 134/79  Pulse: 75 77  Resp: 12 16  Temp:  36.5 C  SpO2: 96% 100%    Last Pain:  Vitals:   03/28/19 0940  TempSrc:   PainSc: 0-No pain                 Britanie Harshman P Jeter Tomey

## 2019-03-28 NOTE — Anesthesia Procedure Notes (Signed)
Procedure Name: MAC Date/Time: 03/28/2019 8:32 AM Performed by: Signe Colt, CRNA Pre-anesthesia Checklist: Patient identified, Emergency Drugs available, Suction available, Patient being monitored and Timeout performed Patient Re-evaluated:Patient Re-evaluated prior to induction Oxygen Delivery Method: Simple face mask

## 2019-03-28 NOTE — Discharge Instructions (Addendum)

## 2019-03-28 NOTE — Brief Op Note (Signed)
03/28/2019  8:59 AM  PATIENT:  Elijah Taylor  68 y.o. male  PRE-OPERATIVE DIAGNOSIS:  TRIGGER RIGHT INDEX  POST-OPERATIVE DIAGNOSIS:  TRIGGER RIGHT INDEX  PROCEDURE:  Procedure(s) with comments: RELEASE TRIGGER FINGER/A-1 PULLEY RIGHT INDEX (Right) - FAB  SURGEON:  Surgeon(s) and Role:    * Daryll Brod, MD - Primary  PHYSICIAN ASSISTANT:   ASSISTANTS: none   ANESTHESIA:   local, regional and IV sedation  EBL:  35ml  BLOOD ADMINISTERED:none  DRAINS: none   LOCAL MEDICATIONS USED:  BUPIVICAINE   SPECIMEN:  No Specimen  DISPOSITION OF SPECIMEN:  N/A  COUNTS:  YES  TOURNIQUET:   Total Tourniquet Time Documented: Forearm (Right) - 15 minutes Total: Forearm (Right) - 15 minutes   DICTATION: .Dragon Dictation  PLAN OF CARE: Discharge to home after PACU  PATIENT DISPOSITION:  PACU - hemodynamically stable.

## 2019-03-28 NOTE — Transfer of Care (Signed)
Immediate Anesthesia Transfer of Care Note  Patient: Elijah Taylor  Procedure(s) Performed: RELEASE TRIGGER FINGER/A-1 PULLEY RIGHT INDEX (Right Hand)  Patient Location: PACU  Anesthesia Type:Bier block  Level of Consciousness: awake, alert , oriented and patient cooperative  Airway & Oxygen Therapy: Patient Spontanous Breathing and Patient connected to face mask oxygen  Post-op Assessment: Report given to RN and Post -op Vital signs reviewed and stable  Post vital signs: Reviewed and stable  Last Vitals:  Vitals Value Taken Time  BP    Temp    Pulse 81 03/28/19 0859  Resp    SpO2 98 % 03/28/19 0859  Vitals shown include unvalidated device data.  Last Pain:  Vitals:   03/28/19 0725  TempSrc: Oral  PainSc: 0-No pain         Complications: No apparent anesthesia complications

## 2019-03-28 NOTE — H&P (Signed)
Elijah Taylor is an 68 y.o. male.   Chief Complaint: catching index finger rightHPI: Elijah Taylor is a 68 yo male complaining of his right index finger. Was last seen in 2018 following a release of the trigger finger of his right middle. He recalls no history of injury. He states is been going on for at least a year. Is locking especially in the morning. He has burning tingling pain in his index finger at the metacarpal phalangeal joint. Is not had any treatment or tried anything for this. He states occasionally has to use his opposite hand to straighten it. He has borderline diabetes no history of thyroid problems arthritis or gout. Family history is positive diabetes negative for the remainder.    Past Medical History:  Diagnosis Date  . Arthritis   . Chronic kidney disease    H/O KIDNEY STONES  . History of kidney stones   . Insomnia   . Prostate cancer ([place])   . 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 Wears glasses     Past Surgical History:  Procedure Laterality Date  . CARPAL TUNNEL RELEASE     left  . CARPAL TUNNEL RELEASE Right 12/01/2014   Procedure: RIGHT CARPAL TUNNEL RELEASE;  Surgeon: Daryll Brod, MD;  Location: [place];  Service: Orthopedics;  Laterality: Right;  . CERVICAL FUSION  2007  . COLONOSCOPY    . COLONOSCOPY WITH PROPOFOL N/A 11/07/2017   Procedure: COLONOSCOPY WITH PROPOFOL;  Surgeon: [name], MD;  [facility];  Service: Gastroenterology;  Laterality: N/A;  . CYSTOSCOPY MACROPLASTIQUE IMPLANT N/A 09/15/2015   Procedure: CYSTOSCOPY MACROPLASTIQUE IMPLANT;  Surgeon: Royston Cowper, MD;  Location: [facility];  Service: Urology;  Laterality: N/A;  . CYSTOSCOPY/URETEROSCOPY/HOLMIUM LASER/STENT PLACEMENT Left 06/29/2018   Procedure: CYSTOSCOPY/URETEROSCOPY/HOLMIUM LASER/STENT PLACEMENT;  Surgeon: Billey Co, MD;  Location: [facility];  Service: Urology;  Laterality: Left;  . EXTRACORPOREAL SHOCK WAVE LITHOTRIPSY  Left 07/12/2018   Procedure: EXTRACORPOREAL SHOCK WAVE LITHOTRIPSY (ESWL);  Surgeon: Billey Co, MD;  Location: [facility];  Service: Urology;  Laterality: Left;  . EXTRACORPOREAL SHOCK WAVE LITHOTRIPSY Right 10/18/2018   Procedure: EXTRACORPOREAL SHOCK WAVE LITHOTRIPSY (ESWL);  Surgeon: Royston Cowper, MD;  Location: [facility];  Service: Urology;  Laterality: Right;  . RETROPUBIC PROSTATECTOMY  2010  . SHOULDER ARTHROSCOPY W/ ROTATOR CUFF REPAIR     right and left  . TRIGGER FINGER RELEASE     x 4  . TRIGGER FINGER RELEASE Left 09/02/2014   Procedure: RELEASE A-1 PULLEY LEFT INDEX FINGER;  Surgeon: Daryll Brod, MD;  Location: [place];  Service: Orthopedics;  Laterality: Left;  ANESTHESIA: IV REGIONAL FAB  . TRIGGER FINGER RELEASE Right 05/16/2017   Procedure: RELEASE TRIGGER FINGER/A-1 PULLEY RIGHT MIDDLE FINGER;  Surgeon: Daryll Brod, MD;  Location: [place];  Service: Orthopedics;  Laterality: Right;    Family History  Problem Relation Age of Onset  . Cancer Mother        Brain Tumor  . Kidney failure Father   . Prostate cancer Father   . Heart disease Brother   . Heart attack Maternal Grandfather   . Prostate cancer Maternal Uncle   . Prostate cancer Maternal Uncle    Social History:  reports that he has never smoked. He has never used smokeless tobacco. He reports current alcohol use of about 1.0 standard drinks of alcohol per week. He reports that he does not use drugs.  Allergies: No Known Allergies  No medications prior to admission.    No results found for this or any previous visit (from the past 48 hour(s)).  No results found.   Pertinent items are noted in HPI.  Height 5\' 9"  (1.753 m), weight 83 kg.  General appearance: alert, cooperative and appears stated age Head: Normocephalic, without obvious abnormality Neck: no JVD Resp: clear to auscultation bilaterally Cardio: regular rate and rhythm, S1, S2 normal, no murmur,  click, rub or gallop GI: soft, non-tender; bowel sounds normal; no masses,  no organomegaly Extremities: trigger right index Pulses: 2+ and symmetric Skin: Skin color, texture, turgor normal. No rashes or lesions Neurologic: Grossly normal Incision/Wound: na  Assessment/Plan Assessment:  1. Trigger index finger of right hand    Plan: We have discussed possible injections with him. He states he would just prefer to have this surgically released. Pre-peri-and postoperative course been discussed along with risk complications. He is aware that there is no guarantee to the surgery the possibility of infection recurrence injury to arteries nerves tendons complete week symptoms dystrophy. He is scheduled for release A1 pulley right index finger as an outpatient under regional anesthesia.   Daryll Brod 03/28/2019, 6:29 AM

## 2019-03-28 NOTE — Anesthesia Procedure Notes (Signed)
Anesthesia Regional Block: Bier block (IV Regional)   Pre-Anesthetic Checklist: ,, timeout performed, Correct Patient, Correct Site, Correct Laterality, Correct Procedure,, site marked, surgical consent,, at surgeon's request  Laterality: Right     Needles:  Injection technique: Single-shot  Needle Type: Other      Needle Gauge: 20     Additional Needles:   Procedures:,,,,, intact distal pulses, Esmarch exsanguination, single tourniquet utilized,  Narrative:   Performed by: Personally       

## 2019-03-28 NOTE — Op Note (Signed)
NAME: [name] MEDICAL RECORD NO: [id number] DATE OF BIRTH: May 02, 1951 FACILITY: [facility] LOCATION:  SURGERY CENTER PHYSICIAN: Wynonia Sours, MD   OPERATIVE REPORT   DATE OF PROCEDURE: 03/28/19    PREOPERATIVE DIAGNOSIS:   Stenosing tenosynovitis right index finger   POSTOPERATIVE DIAGNOSIS:   Same   PROCEDURE:   Release A1 pulley right index finger   SURGEON: Daryll Brod, M.D.   ASSISTANT: none   ANESTHESIA:  Bier block with sedation and Local   INTRAVENOUS FLUIDS:  Per anesthesia flow sheet.   ESTIMATED BLOOD LOSS:  Minimal.   COMPLICATIONS:  None.   SPECIMENS:  none   TOURNIQUET TIME:    Total Tourniquet Time Documented: Forearm (Right) - 15 minutes Total: Forearm (Right) - 15 minutes    DISPOSITION:  Stable to PACU.   INDICATIONS: Patient is a 68 year old male with a history of catching of his right index finger.  He is undergone releases of trigger fingers in the past and has elected to proceed with surgical intervention without injections.  Pre-peri-and postoperative course are well-known to him.  He is aware that there is no guarantee to the surgery the possibility of infection recurrence injury to arteries nerves tendons complete relief of symptoms dystrophy.  The preoperative area the patient is seen the extremity marked by both patient and surgeon antibiotic given  OPERATIVE COURSE: Patient is brought to the operating room where form based IV regional anesthetic was carried out without difficulty under the direction of the anesthesia department.  Was prepped using ChloraPrep supine position with the right arm free.  A three-minute dry time was allowed and a timeout taken to confirm patient procedure.  The oblique incision was made over the A1 pulley of the index finger right hand.  This was carried down through subcutaneous tissue.  Retractors were placed retracting neurovascular bundles radially and ulnarly.  The A1 pulley was identified found  to be markedly thickened.  This was released on its radial aspect a small incision was made centrally and A2 partial tenosynovectomy performed proximally.  The 2 tendons separated to break any adhesions.  The finger was put placed through full passive range of motion no further triggering was noted.  The wound was copiously irrigated with saline.  The skin was closed and opted for nylon sutures.  Local infiltration quarter percent bupivacaine without epinephrine was good approximately 6 cc was used.  A sterile compressive dressing with the fingers free was applied.  Inflation the tourniquet all fingers immediately pink.  He was taken to the recovery room for observation in satisfactory condition.  He will be discharged home to return the hand center [facility] in 1 week on Tylenol ibuprofen for pain with Ultram for backup breakthrough backup.   Daryll Brod, MD Electronically signed, 03/28/19

## 2019-03-29 ENCOUNTER — Encounter (HOSPITAL_BASED_OUTPATIENT_CLINIC_OR_DEPARTMENT_OTHER): Payer: Self-pay | Admitting: Orthopedic Surgery

## 2019-08-19 ENCOUNTER — Encounter: Payer: Self-pay | Admitting: Family Medicine

## 2019-08-19 ENCOUNTER — Ambulatory Visit
Admission: RE | Admit: 2019-08-19 | Discharge: 2019-08-19 | Disposition: A | Discharge status: Home / Self Care | Payer: Medicare Other | Source: Ambulatory Visit | Attending: Family Medicine | Admitting: Family Medicine

## 2019-08-19 ENCOUNTER — Ambulatory Visit (INDEPENDENT_AMBULATORY_CARE_PROVIDER_SITE_OTHER): Payer: Medicare Other | Admitting: Family Medicine

## 2019-08-19 ENCOUNTER — Telehealth: Payer: Self-pay | Admitting: Family Medicine

## 2019-08-19 ENCOUNTER — Other Ambulatory Visit: Payer: Self-pay

## 2019-08-19 VITALS — BP 162/92 | HR 111 | Temp 98.3°F

## 2019-08-19 DIAGNOSIS — M545 Low back pain, unspecified: Secondary | ICD-10-CM

## 2019-08-19 LAB — UA/M W/RFLX CULTURE, ROUTINE
Bilirubin, UA: NEGATIVE
Glucose, UA: NEGATIVE
Ketones, UA: NEGATIVE
Leukocytes,UA: NEGATIVE
Nitrite, UA: NEGATIVE
Protein,UA: NEGATIVE
RBC, UA: NEGATIVE
Specific Gravity, UA: 1.025 (ref 1.005–1.030)
Urobilinogen, Ur: 0.2 mg/dL (ref 0.2–1.0)
pH, UA: 5.5 (ref 5.0–7.5)

## 2019-08-19 MED ORDER — HYDROCODONE-ACETAMINOPHEN 5-325 MG PO TABS: 1.0000 | ORAL_TABLET | Freq: Four times a day (QID) | ORAL | 0 refills | Status: DC | PRN

## 2019-08-19 MED ORDER — CYCLOBENZAPRINE HCL 10 MG PO TABS: 10.0000 mg | ORAL_TABLET | Freq: Every day | ORAL | 0 refills | Status: DC

## 2019-08-19 MED ORDER — KETOROLAC TROMETHAMINE 60 MG/2ML IM SOLN
60.0000 mg | Freq: Once | INTRAMUSCULAR | Status: AC
  Administered 2019-08-19: 60 mg via INTRAMUSCULAR

## 2019-08-19 MED ORDER — NAPROXEN 500 MG PO TABS: 500.0000 mg | ORAL_TABLET | Freq: Two times a day (BID) | ORAL | 3 refills | Status: DC

## 2019-08-19 NOTE — Telephone Encounter (Signed)
Copied from [place] [phone]. Topic: General - Other >> Aug 19, 2019 11:52 AM Keene Breath wrote: Reason for CRM: Patient has decided that he does want an x-ray of his back after all because it is not  feeling better after the shot that the doctor gave him.  CB# [phone] or his wife at [phone]

## 2019-08-19 NOTE — Patient Instructions (Addendum)
You can go get an x-ray if you'd like. The order is in.  Take your medicine and I'll see you in 1 week.      Low Back Sprain or Strain Rehab Ask your health care provider which exercises are safe for you. Do exercises exactly as told by your health care provider and adjust them as directed. It is normal to feel mild stretching, pulling, tightness, or discomfort as you do these exercises. Stop right away if you feel sudden pain or your pain gets worse. Do not begin these exercises until told by your health care provider. Stretching and range-of-motion exercises These exercises warm up your muscles and joints and improve the movement and flexibility of your back. These exercises also help to relieve pain, numbness, and tingling. Lumbar rotation  1. Lie on your back on a firm surface and bend your knees. 2. Straighten your arms out to your sides so each arm forms a 90-degree angle (right angle) with a side of your body. 3. Slowly move (rotate) both of your knees to one side of your body until you feel a stretch in your lower back (lumbar). Try not to let your shoulders lift off the floor. 4. Hold this position for __________ seconds. 5. Tense your abdominal muscles and slowly move your knees back to the starting position. 6. Repeat this exercise on the other side of your body. Repeat __________ times. Complete this exercise __________ times a day. Single knee to chest  1. Lie on your back on a firm surface with both legs straight. 2. Bend one of your knees. Use your hands to move your knee up toward your chest until you feel a gentle stretch in your lower back and buttock. ? Hold your leg in this position by holding on to the front of your knee. ? Keep your other leg as straight as possible. 3. Hold this position for __________ seconds. 4. Slowly return to the starting position. 5. Repeat with your other leg. Repeat __________ times. Complete this exercise __________ times a day. Prone  extension on elbows  1. Lie on your abdomen on a firm surface (prone position). 2. Prop yourself up on your elbows. 3. Use your arms to help lift your chest up until you feel a gentle stretch in your abdomen and your lower back. ? This will place some of your body weight on your elbows. If this is uncomfortable, try stacking pillows under your chest. ? Your hips should stay down, against the surface that you are lying on. Keep your hip and back muscles relaxed. 4. Hold this position for __________ seconds. 5. Slowly relax your upper body and return to the starting position. Repeat __________ times. Complete this exercise __________ times a day. Strengthening exercises These exercises build strength and endurance in your back. Endurance is the ability to use your muscles for a long time, even after they get tired. Pelvic tilt This exercise strengthens the muscles that lie deep in the abdomen. 1. Lie on your back on a firm surface. Bend your knees and keep your feet flat on the floor. 2. Tense your abdominal muscles. Tip your pelvis up toward the ceiling and flatten your lower back into the floor. ? To help with this exercise, you may place a small towel under your lower back and try to push your back into the towel. 3. Hold this position for __________ seconds. 4. Let your muscles relax completely before you repeat this exercise. Repeat __________ times. Complete this exercise  __________ times a day. Alternating arm and leg raises  1. Get on your hands and knees on a firm surface. If you are on a hard floor, you may want to use padding, such as an exercise mat, to cushion your knees. 2. Line up your arms and legs. Your hands should be directly below your shoulders, and your knees should be directly below your hips. 3. Lift your left leg behind you. At the same time, raise your right arm and straighten it in front of you. ? Do not lift your leg higher than your hip. ? Do not lift your arm  higher than your shoulder. ? Keep your abdominal and back muscles tight. ? Keep your hips facing the ground. ? Do not arch your back. ? Keep your balance carefully, and do not hold your breath. 4. Hold this position for __________ seconds. 5. Slowly return to the starting position. 6. Repeat with your right leg and your left arm. Repeat __________ times. Complete this exercise __________ times a day. Abdominal set with straight leg raise  1. Lie on your back on a firm surface. 2. Bend one of your knees and keep your other leg straight. 3. Tense your abdominal muscles and lift your straight leg up, 4-6 inches (10-15 cm) off the ground. 4. Keep your abdominal muscles tight and hold this position for __________ seconds. ? Do not hold your breath. ? Do not arch your back. Keep it flat against the ground. 5. Keep your abdominal muscles tense as you slowly lower your leg back to the starting position. 6. Repeat with your other leg. Repeat __________ times. Complete this exercise __________ times a day. Single leg lower with bent knees 1. Lie on your back on a firm surface. 2. Tense your abdominal muscles and lift your feet off the floor, one foot at a time, so your knees and hips are bent in 90-degree angles (right angles). ? Your knees should be over your hips and your lower legs should be parallel to the floor. 3. Keeping your abdominal muscles tense and your knee bent, slowly lower one of your legs so your toe touches the ground. 4. Lift your leg back up to return to the starting position. ? Do not hold your breath. ? Do not let your back arch. Keep your back flat against the ground. 5. Repeat with your other leg. Repeat __________ times. Complete this exercise __________ times a day. Posture and body mechanics Good posture and healthy body mechanics can help to relieve stress in your body's tissues and joints. Body mechanics refers to the movements and positions of your body while you do  your daily activities. Posture is part of body mechanics. Good posture means:  Your spine is in its natural S-curve position (neutral).  Your shoulders are pulled back slightly.  Your head is not tipped forward. Follow these guidelines to improve your posture and body mechanics in your everyday activities. Standing   When standing, keep your spine neutral and your feet about hip width apart. Keep a slight bend in your knees. Your ears, shoulders, and hips should line up.  When you do a task in which you stand in one place for a long time, place one foot up on a stable object that is 2-4 inches (5-10 cm) high, such as a footstool. This helps keep your spine neutral. Sitting   When sitting, keep your spine neutral and keep your feet flat on the floor. Use a footrest, if necessary, and  keep your thighs parallel to the floor. Avoid rounding your shoulders, and avoid tilting your head forward.  When working at a desk or a computer, keep your desk at a height where your hands are slightly lower than your elbows. Slide your chair under your desk so you are close enough to maintain good posture.  When working at a computer, place your monitor at a height where you are looking straight ahead and you do not have to tilt your head forward or downward to look at the screen. Resting  When lying down and resting, avoid positions that are most painful for you.  If you have pain with activities such as sitting, bending, stooping, or squatting, lie in a position in which your body does not bend very much. For example, avoid curling up on your side with your arms and knees near your chest (fetal position).  If you have pain with activities such as standing for a long time or reaching with your arms, lie with your spine in a neutral position and bend your knees slightly. Try the following positions: ? Lying on your side with a pillow between your knees. ? Lying on your back with a pillow under your  knees. Lifting   When lifting objects, keep your feet at least shoulder width apart and tighten your abdominal muscles.  Bend your knees and hips and keep your spine neutral. It is important to lift using the strength of your legs, not your back. Do not lock your knees straight out.  Always ask for help to lift heavy or awkward objects. This information is not intended to replace advice given to you by your health care provider. Make sure you discuss any questions you have with your health care provider. Document Revised: 11/16/2018 Document Reviewed: 08/16/2018 Elsevier Patient Education  [place].

## 2019-08-19 NOTE — Progress Notes (Signed)
BP (!) 162/92   Pulse (!) 111   Temp 98.3 F (36.8 C)   SpO2 97%    Subjective:    Patient ID: Elijah Taylor, male    DOB: [birth year], 69 y.o.   MRN: [id number]  HPI: Elijah Taylor is a 69 y.o. male  Chief Complaint  Patient presents with  . Back Pain   BACK PAIN Duration: 4 days Mechanism of injury: unknown Location: Left and low back Onset: sudden Severity: severe Quality: sharp Frequency: constant, waxing and wanes Radiation: radiating into his groin Aggravating factors: movement and prolonged sitting Alleviating factors: rest and heat Status: worse Treatments attempted: rest, heat and APAP  Relief with NSAIDs?: No NSAIDs Taken Nighttime pain:  yes Paresthesias / decreased sensation:  no Bowel / bladder incontinence:  no Fevers:  no Dysuria / urinary frequency:  no  Relevant past medical, surgical, family and social history reviewed and updated as indicated. Interim medical history since our last visit reviewed. Allergies and medications reviewed and updated.  Review of Systems  Constitutional: Negative.   Respiratory: Negative.   Cardiovascular: Negative.   Musculoskeletal: Positive for back pain and myalgias. Negative for arthralgias, gait problem, joint swelling, neck pain and neck stiffness.  Skin: Negative.   Neurological: Negative.   Hematological: Negative.   Psychiatric/Behavioral: Negative.     Per HPI unless specifically indicated above     Objective:    BP (!) 162/92   Pulse (!) 111   Temp 98.3 F (36.8 C)   SpO2 97%   Wt Readings from Last 3 Encounters:  03/28/19 186 lb 1.1 oz (84.4 kg)  10/20/18 180 lb (81.6 kg)  10/18/18 181 lb (82.1 kg)    Physical Exam Constitutional:      General: He is not in acute distress.    Appearance: He is well-developed.     Comments: Uncomfortable appearing  HENT:     Head: Normocephalic and atraumatic.     Right Ear: Hearing normal.     Left Ear: Hearing normal.     Nose: Nose normal.   Eyes:     General: Lids are normal. No scleral icterus.       Right eye: No discharge.        Left eye: No discharge.     Conjunctiva/sclera: Conjunctivae normal.  Pulmonary:     Effort: Pulmonary effort is normal. No respiratory distress.  Musculoskeletal:        General: Tenderness (L low back with moderate spasm) present.  Skin:    Findings: No rash.  Neurological:     Mental Status: He is alert and oriented to person, place, and time.  Psychiatric:        Speech: Speech normal.        Behavior: Behavior normal.        Thought Content: Thought content normal.        Judgment: Judgment normal.     Results for orders placed or performed during the hospital encounter of 03/25/19  SARS CORONAVIRUS 2 Nasal Swab Aptima Multi Swab   Specimen: Aptima Multi Swab; Nasal Swab  Result Value Ref Range   SARS Coronavirus 2 NEGATIVE NEGATIVE      Assessment & Plan:   Problem List Items Addressed This Visit    None    Visit Diagnoses    Acute low back pain, unspecified back pain laterality, unspecified whether sciatica present    -  Primary   Normal urine. Will treat with flexeril,  naproxen, vicodin, toradol shot today. Exercises. Recheck 1 week. If not better, will get him back into PT.   Relevant Medications   ketorolac (TORADOL) injection 60 mg (Start on 08/19/2019  9:45 AM)   naproxen (NAPROSYN) 500 MG tablet   cyclobenzaprine (FLEXERIL) 10 MG tablet   HYDROcodone-acetaminophen (NORCO/VICODIN) 5-325 MG tablet   Other Relevant Orders   UA/M w/rflx Culture, Routine   DG Lumbar Spine Complete       Follow up plan: Return in about 1 week (around 08/26/2019).

## 2019-08-19 NOTE — Telephone Encounter (Signed)
The order is already in. He can go whenever he'd like.

## 2019-08-19 NOTE — Telephone Encounter (Signed)
Pt.notified

## 2019-08-20 ENCOUNTER — Telehealth: Payer: Self-pay

## 2019-08-20 NOTE — Telephone Encounter (Signed)
PA for Cyclobenazeprine initiated and submitted via Cover My Meds. Key: [id number]

## 2019-08-21 NOTE — Telephone Encounter (Signed)
PA approved.

## 2019-08-22 ENCOUNTER — Telehealth: Payer: Self-pay | Admitting: Family Medicine

## 2019-08-22 NOTE — Telephone Encounter (Signed)
Please let him know that his x-ray shows arthritis. We can't 100% compare a x-ray to an MRI, but the location of the arthritis is the same as on the MRI in 2016. Can we check to see how he's feeling?

## 2019-08-22 NOTE — Telephone Encounter (Signed)
Called patient. LVM for patient to return call to the office.

## 2019-08-23 NOTE — Telephone Encounter (Signed)
Called patient. LVM for patient to return my call.

## 2019-08-23 NOTE — Telephone Encounter (Signed)
Returned call to patient. Message relayed. Patient stated he is feeling better. Patient wanted to cancel the 08/27/19 appointment and he said he will call back to reschedule if he needs to. FYI

## 2019-08-23 NOTE — Telephone Encounter (Signed)
Pt returning call. Please advise. °

## 2019-08-27 ENCOUNTER — Ambulatory Visit: Payer: Medicare Other | Admitting: Family Medicine

## 2019-11-29 DIAGNOSIS — D400 Neoplasm of uncertain behavior of prostate: Secondary | ICD-10-CM | POA: Diagnosis not present

## 2019-11-29 DIAGNOSIS — N403 Nodular prostate with lower urinary tract symptoms: Secondary | ICD-10-CM | POA: Diagnosis not present

## 2019-11-29 DIAGNOSIS — C61 Malignant neoplasm of prostate: Secondary | ICD-10-CM | POA: Diagnosis not present

## 2020-02-11 ENCOUNTER — Telehealth: Payer: Self-pay | Admitting: Family Medicine

## 2020-02-11 NOTE — Telephone Encounter (Signed)
I contacted the patient to schedule his AWVS.  He declined due to being billed for the last AWVS he said.    He stated that he would be glad to schedule, if we could promise he would not be sent a bill. Could you look into this for him or forward to someone that can find out for the patient why he was billed? Thank you

## 2020-02-12 NOTE — Telephone Encounter (Signed)
I honestly do not know who to contact for this.

## 2020-02-20 ENCOUNTER — Other Ambulatory Visit: Payer: Self-pay | Admitting: Neurological Surgery

## 2020-02-20 DIAGNOSIS — M542 Cervicalgia: Secondary | ICD-10-CM

## 2020-02-20 DIAGNOSIS — R519 Headache, unspecified: Secondary | ICD-10-CM

## 2020-03-06 ENCOUNTER — Ambulatory Visit
Admission: RE | Admit: 2020-03-06 | Discharge: 2020-03-06 | Disposition: A | Discharge status: Home / Self Care | Payer: Medicare Other | Source: Ambulatory Visit | Attending: Neurological Surgery | Admitting: Neurological Surgery

## 2020-03-06 ENCOUNTER — Other Ambulatory Visit: Payer: Self-pay

## 2020-03-06 DIAGNOSIS — M542 Cervicalgia: Secondary | ICD-10-CM

## 2020-03-06 DIAGNOSIS — G9389 Other specified disorders of brain: Secondary | ICD-10-CM | POA: Diagnosis not present

## 2020-03-06 DIAGNOSIS — R519 Headache, unspecified: Secondary | ICD-10-CM

## 2020-03-06 DIAGNOSIS — M4803 Spinal stenosis, cervicothoracic region: Secondary | ICD-10-CM | POA: Diagnosis not present

## 2020-03-06 DIAGNOSIS — I6782 Cerebral ischemia: Secondary | ICD-10-CM | POA: Diagnosis not present

## 2020-03-06 DIAGNOSIS — M47812 Spondylosis without myelopathy or radiculopathy, cervical region: Secondary | ICD-10-CM | POA: Diagnosis not present

## 2020-03-31 DIAGNOSIS — M542 Cervicalgia: Secondary | ICD-10-CM | POA: Diagnosis not present

## 2020-04-09 DIAGNOSIS — M542 Cervicalgia: Secondary | ICD-10-CM | POA: Diagnosis not present

## 2020-04-09 DIAGNOSIS — M545 Low back pain: Secondary | ICD-10-CM | POA: Diagnosis not present

## 2020-04-14 ENCOUNTER — Other Ambulatory Visit: Payer: Self-pay | Admitting: Neurological Surgery

## 2020-04-14 DIAGNOSIS — M542 Cervicalgia: Secondary | ICD-10-CM | POA: Diagnosis not present

## 2020-04-28 NOTE — Progress Notes (Signed)
[id number] - [place], [place] - [address] [place] [address] Phone: [phone] Fax: [phone]      Your procedure is scheduled on September 24  Report to [facility] Main Entrance "A" at 0530 A.M., and check in at the Admitting office.  Call this number if you have problems the morning of surgery:  [phone]  Call [phone] if you have any questions prior to your surgery date Monday-Friday 8am-4pm    Remember:  Do not eat or drink after midnight the night before your surgery    There are NO medications that you need to take the morning of surgery  Follow your surgeon's instructions on when to stop Aspirin.  If no instructions were given by your surgeon then you will need to call the office to get those instructions.    As of today, STOP taking any Aspirin (unless otherwise instructed by your surgeon) Aleve, Naproxen, Ibuprofen, Motrin, Advil, Goody's, BC's, all herbal medications, fish oil, and all vitamins.                      Do not wear jewelry            Do not wear lotions, powders, colognes, or deodorant.            Men may shave face and neck.            Do not bring valuables to the hospital.            [facility] is not responsible for any belongings or valuables.  Do NOT Smoke (Tobacco/Vaping) or drink Alcohol 24 hours prior to your procedure If you use a CPAP at night, you may bring all equipment for your overnight stay.   Contacts, glasses, dentures or bridgework may not be worn into surgery.      For patients admitted to the hospital, discharge time will be determined by your treatment team.   Patients discharged the day of surgery will not be allowed to drive home, and someone needs to stay with them for 24 hours.    Special instructions:   [facility]- Preparing For Surgery  Before surgery, you can play an important role. Because skin is not sterile, your skin needs to be as free of germs as possible. You can reduce the  number of germs on your skin by washing with CHG (chlorahexidine gluconate) Soap before surgery.  CHG is an antiseptic cleaner which kills germs and bonds with the skin to continue killing germs even after washing.    Oral Hygiene is also important to reduce your risk of infection.  Remember - BRUSH YOUR TEETH THE MORNING OF SURGERY WITH YOUR REGULAR TOOTHPASTE  Please do not use if you have an allergy to CHG or antibacterial soaps. If your skin becomes reddened/irritated stop using the CHG.  Do not shave (including legs and underarms) for at least 48 hours prior to first CHG shower. It is OK to shave your face.  Please follow these instructions carefully.   1. Shower the NIGHT BEFORE SURGERY and the MORNING OF SURGERY with CHG Soap.   2. If you chose to wash your hair, wash your hair first as usual with your normal shampoo.  3. After you shampoo, rinse your hair and body thoroughly to remove the shampoo.  4. Use CHG as you would any other liquid soap. You can apply CHG directly to the skin and wash gently with a scrungie  or a clean washcloth.   5. Apply the CHG Soap to your body ONLY FROM THE NECK DOWN.  Do not use on open wounds or open sores. Avoid contact with your eyes, ears, mouth and genitals (private parts). Wash Face and genitals (private parts)  with your normal soap.   6. Wash thoroughly, paying special attention to the area where your surgery will be performed.  7. Thoroughly rinse your body with warm water from the neck down.  8. DO NOT shower/wash with your normal soap after using and rinsing off the CHG Soap.  9. Pat yourself dry with a CLEAN TOWEL.  10. Wear CLEAN PAJAMAS to bed the night before surgery  11. Place CLEAN SHEETS on your bed the night of your first shower and DO NOT SLEEP WITH PETS.   Day of Surgery: Wear Clean/Comfortable clothing the morning of surgery Do not apply any deodorants/lotions.   Remember to brush your teeth WITH YOUR REGULAR TOOTHPASTE.    Please read over the following fact sheets that you were given.

## 2020-04-29 ENCOUNTER — Other Ambulatory Visit: Payer: Self-pay

## 2020-04-29 ENCOUNTER — Encounter (HOSPITAL_COMMUNITY)
Admission: RE | Admit: 2020-04-29 | Discharge: 2020-04-29 | Disposition: A | Discharge status: Home / Self Care | Payer: Medicare Other | Source: Ambulatory Visit | Attending: Neurological Surgery | Admitting: Neurological Surgery

## 2020-04-29 ENCOUNTER — Other Ambulatory Visit (HOSPITAL_COMMUNITY)
Admission: RE | Admit: 2020-04-29 | Discharge: 2020-04-29 | Disposition: A | Discharge status: Home / Self Care | Payer: Medicare Other | Source: Ambulatory Visit | Attending: Neurological Surgery | Admitting: Neurological Surgery

## 2020-04-29 ENCOUNTER — Encounter (HOSPITAL_COMMUNITY): Payer: Self-pay

## 2020-04-29 DIAGNOSIS — Z01812 Encounter for preprocedural laboratory examination: Secondary | ICD-10-CM | POA: Insufficient documentation

## 2020-04-29 DIAGNOSIS — Z20822 Contact with and (suspected) exposure to covid-19: Secondary | ICD-10-CM | POA: Insufficient documentation

## 2020-04-29 LAB — CBC
HCT: 50.3 % (ref 39.0–52.0)
Hemoglobin: 16.7 g/dL (ref 13.0–17.0)
MCH: 31.3 pg (ref 26.0–34.0)
MCHC: 33.2 g/dL (ref 30.0–36.0)
MCV: 94.2 fL (ref 80.0–100.0)
Platelets: 293 K/uL (ref 150–400)
RBC: 5.34 MIL/uL (ref 4.22–5.81)
RDW: 12.4 % (ref 11.5–15.5)
WBC: 8.9 K/uL (ref 4.0–10.5)
nRBC: 0 % (ref 0.0–0.2)

## 2020-04-29 LAB — SURGICAL PCR SCREEN
MRSA, PCR: NEGATIVE
Staphylococcus aureus: NEGATIVE

## 2020-04-29 LAB — TYPE AND SCREEN
ABO/RH(D): A POS
Antibody Screen: NEGATIVE

## 2020-04-29 LAB — SARS CORONAVIRUS 2 (TAT 6-24 HRS): SARS Coronavirus 2: NEGATIVE

## 2020-04-29 NOTE — Progress Notes (Signed)
PCP - Dr. Jinny Blossom  Cardiologist - denies  PPM/ICD - denies  Chest x-ray - N/A EKG - N/A Stress Test - denies ECHO - denies Cardiac Cath - denies  Sleep Study - denies CPAP - N/A  DM: denies  Blood Thinner Instructions: N/A Aspirin Instructions: per patient, instructed to stop one week prior to surgery  ERAS Protcol - No  COVID TEST- Scheduled for today 04/29/2020. Patient verbalized understanding of self-quarantine instructions, appointment time and place.  Anesthesia review: No  Patient denies shortness of breath, fever, cough and chest pain at PAT appointment  All instructions explained to the patient, with a verbal understanding of the material. Patient agrees to go over the instructions while at home for a better understanding. Patient also instructed to self quarantine after being tested for COVID-19. The opportunity to ask questions was provided.

## 2020-04-30 NOTE — Anesthesia Preprocedure Evaluation (Addendum)
Anesthesia Evaluation  Patient identified by MRN, date of birth, ID band Patient awake    Reviewed: Allergy & Precautions, NPO status , Patient's Chart, lab work & pertinent test results  Airway Mallampati: I  TM Distance: >3 FB Neck ROM: Full    Dental  (+) Chipped,    Pulmonary neg pulmonary ROS,    Pulmonary exam normal breath sounds clear to auscultation       Cardiovascular negative cardio ROS Normal cardiovascular exam Rhythm:Regular Rate:Normal     Neuro/Psych negative psych ROS   GI/Hepatic negative GI ROS, Neg liver ROS,   Endo/Other  negative endocrine ROS  Renal/GU Renal disease     Musculoskeletal  (+) Arthritis ,   Abdominal   Peds  Hematology negative hematology ROS (+)   Anesthesia Other Findings Cervicalgia  Reproductive/Obstetrics                            Anesthesia Physical Anesthesia Plan  ASA: II  Anesthesia Plan: General   Post-op Pain Management:    Induction: Intravenous  PONV Risk Score and Plan: 3 and Midazolam, Dexamethasone, Ondansetron and Treatment may vary due to age or medical condition  Airway Management Planned: Oral ETT and Video Laryngoscope Planned  Additional Equipment:   Intra-op Plan:   Post-operative Plan: Extubation in OR  Informed Consent: I have reviewed the patients History and Physical, chart, labs and discussed the procedure including the risks, benefits and alternatives for the proposed anesthesia with the patient or authorized representative who has indicated his/her understanding and acceptance.     Dental advisory given  Plan Discussed with: CRNA  Anesthesia Plan Comments:        Anesthesia Quick Evaluation

## 2020-05-01 ENCOUNTER — Encounter (HOSPITAL_COMMUNITY): Payer: Self-pay | Admitting: Neurological Surgery

## 2020-05-01 ENCOUNTER — Ambulatory Visit (HOSPITAL_COMMUNITY): Payer: Medicare Other | Admitting: Anesthesiology

## 2020-05-01 ENCOUNTER — Encounter (HOSPITAL_COMMUNITY)
Admission: RE | Disposition: A | Discharge status: Home / Self Care | Payer: Self-pay | Source: Home / Self Care | Attending: Neurological Surgery

## 2020-05-01 ENCOUNTER — Ambulatory Visit (HOSPITAL_COMMUNITY): Payer: Medicare Other

## 2020-05-01 ENCOUNTER — Other Ambulatory Visit: Payer: Self-pay

## 2020-05-01 ENCOUNTER — Ambulatory Visit (HOSPITAL_COMMUNITY)
Admission: RE | Admit: 2020-05-01 | Discharge: 2020-05-01 | Disposition: A | Discharge status: Home / Self Care | Payer: Medicare Other | Attending: Neurological Surgery | Admitting: Neurological Surgery

## 2020-05-01 DIAGNOSIS — M47812 Spondylosis without myelopathy or radiculopathy, cervical region: Secondary | ICD-10-CM | POA: Insufficient documentation

## 2020-05-01 DIAGNOSIS — Z419 Encounter for procedure for purposes other than remedying health state, unspecified: Secondary | ICD-10-CM

## 2020-05-01 DIAGNOSIS — M1990 Unspecified osteoarthritis, unspecified site: Secondary | ICD-10-CM | POA: Diagnosis not present

## 2020-05-01 DIAGNOSIS — Z7982 Long term (current) use of aspirin: Secondary | ICD-10-CM | POA: Diagnosis not present

## 2020-05-01 DIAGNOSIS — Z981 Arthrodesis status: Secondary | ICD-10-CM | POA: Diagnosis not present

## 2020-05-01 DIAGNOSIS — M25511 Pain in right shoulder: Secondary | ICD-10-CM | POA: Insufficient documentation

## 2020-05-01 DIAGNOSIS — M4802 Spinal stenosis, cervical region: Secondary | ICD-10-CM | POA: Diagnosis not present

## 2020-05-01 DIAGNOSIS — C61 Malignant neoplasm of prostate: Secondary | ICD-10-CM | POA: Diagnosis not present

## 2020-05-01 DIAGNOSIS — M4322 Fusion of spine, cervical region: Secondary | ICD-10-CM | POA: Diagnosis not present

## 2020-05-01 DIAGNOSIS — Z79899 Other long term (current) drug therapy: Secondary | ICD-10-CM | POA: Diagnosis not present

## 2020-05-01 DIAGNOSIS — E7800 Pure hypercholesterolemia, unspecified: Secondary | ICD-10-CM | POA: Diagnosis not present

## 2020-05-01 DIAGNOSIS — M25512 Pain in left shoulder: Secondary | ICD-10-CM | POA: Insufficient documentation

## 2020-05-01 DIAGNOSIS — M50221 Other cervical disc displacement at C4-C5 level: Secondary | ICD-10-CM | POA: Diagnosis not present

## 2020-05-01 DIAGNOSIS — Z8546 Personal history of malignant neoplasm of prostate: Secondary | ICD-10-CM | POA: Diagnosis not present

## 2020-05-01 HISTORY — PX: ANTERIOR CERVICAL DECOMP/DISCECTOMY FUSION: SHX1161

## 2020-05-01 LAB — ABO/RH: ABO/RH(D): A POS

## 2020-05-01 SURGERY — ANTERIOR CERVICAL DECOMPRESSION/DISCECTOMY FUSION 1 LEVEL/HARDWARE REMOVAL
Anesthesia: General | Site: Spine Cervical

## 2020-05-01 MED ORDER — ONDANSETRON HCL 4 MG PO TABS: 4.0000 mg | ORAL_TABLET | Freq: Four times a day (QID) | ORAL | Status: DC | PRN

## 2020-05-01 MED ORDER — FENTANYL CITRATE (PF) 100 MCG/2ML IJ SOLN
INTRAMUSCULAR | Status: AC
  Filled 2020-05-01: qty 2

## 2020-05-01 MED ORDER — ORAL CARE MOUTH RINSE: 15.0000 mL | Freq: Once | OROMUCOSAL | Status: AC

## 2020-05-01 MED ORDER — OXYCODONE HCL 15 MG PO TABS
15.0000 mg | ORAL_TABLET | Freq: Four times a day (QID) | ORAL | 0 refills | Status: DC | PRN
Start: 2020-05-01 — End: 2020-12-01

## 2020-05-01 MED ORDER — ONDANSETRON HCL 4 MG/2ML IJ SOLN: 4.0000 mg | Freq: Four times a day (QID) | INTRAMUSCULAR | Status: DC | PRN

## 2020-05-01 MED ORDER — FENTANYL CITRATE (PF) 100 MCG/2ML IJ SOLN
INTRAMUSCULAR | Status: DC | PRN
Start: 2020-05-01 — End: 2020-05-01
  Administered 2020-05-01 (×5): 50 ug via INTRAVENOUS

## 2020-05-01 MED ORDER — METHOCARBAMOL 1000 MG/10ML IJ SOLN
500.0000 mg | Freq: Four times a day (QID) | INTRAVENOUS | Status: DC | PRN
  Filled 2020-05-01: qty 5

## 2020-05-01 MED ORDER — BUPIVACAINE HCL (PF) 0.25 % IJ SOLN
INTRAMUSCULAR | Status: AC
  Filled 2020-05-01: qty 30

## 2020-05-01 MED ORDER — ONDANSETRON HCL 4 MG/2ML IJ SOLN: 4.0000 mg | Freq: Once | INTRAMUSCULAR | Status: DC | PRN

## 2020-05-01 MED ORDER — CHLORHEXIDINE GLUCONATE CLOTH 2 % EX PADS: 6.0000 | MEDICATED_PAD | Freq: Once | CUTANEOUS | Status: DC

## 2020-05-01 MED ORDER — THROMBIN 5000 UNITS EX SOLR: OROMUCOSAL | Status: DC | PRN

## 2020-05-01 MED ORDER — LACTATED RINGERS IV SOLN: INTRAVENOUS | Status: DC

## 2020-05-01 MED ORDER — PHENYLEPHRINE 40 MCG/ML (10ML) SYRINGE FOR IV PUSH (FOR BLOOD PRESSURE SUPPORT)
PREFILLED_SYRINGE | INTRAVENOUS | Status: AC
  Filled 2020-05-01: qty 10

## 2020-05-01 MED ORDER — SUGAMMADEX SODIUM 200 MG/2ML IV SOLN
INTRAVENOUS | Status: DC | PRN
  Administered 2020-05-01: 200 mg via INTRAVENOUS

## 2020-05-01 MED ORDER — CHLORHEXIDINE GLUCONATE 0.12 % MT SOLN
15.0000 mL | Freq: Once | OROMUCOSAL | Status: AC
  Administered 2020-05-01: 15 mL via OROMUCOSAL
  Filled 2020-05-01: qty 15

## 2020-05-01 MED ORDER — METHOCARBAMOL 500 MG PO TABS: 500.0000 mg | ORAL_TABLET | Freq: Four times a day (QID) | ORAL | 0 refills | Status: DC | PRN

## 2020-05-01 MED ORDER — THROMBIN 5000 UNITS EX SOLR
CUTANEOUS | Status: AC
  Filled 2020-05-01: qty 15000

## 2020-05-01 MED ORDER — ONDANSETRON HCL 4 MG/2ML IJ SOLN
INTRAMUSCULAR | Status: DC | PRN
  Administered 2020-05-01: 4 mg via INTRAVENOUS

## 2020-05-01 MED ORDER — PROPOFOL 10 MG/ML IV BOLUS
INTRAVENOUS | Status: AC
  Filled 2020-05-01: qty 20

## 2020-05-01 MED ORDER — PROPOFOL 10 MG/ML IV BOLUS
INTRAVENOUS | Status: DC | PRN
  Administered 2020-05-01: 150 mg via INTRAVENOUS

## 2020-05-01 MED ORDER — PHENYLEPHRINE HCL-NACL 10-0.9 MG/250ML-% IV SOLN
INTRAVENOUS | Status: DC | PRN
  Administered 2020-05-01: 25 ug/min via INTRAVENOUS

## 2020-05-01 MED ORDER — BUPIVACAINE HCL (PF) 0.25 % IJ SOLN
INTRAMUSCULAR | Status: DC | PRN
  Administered 2020-05-01: 5 mL

## 2020-05-01 MED ORDER — LIDOCAINE 2% (20 MG/ML) 5 ML SYRINGE
INTRAMUSCULAR | Status: DC | PRN
  Administered 2020-05-01: 80 mg via INTRAVENOUS

## 2020-05-01 MED ORDER — MORPHINE SULFATE (PF) 2 MG/ML IV SOLN: 2.0000 mg | INTRAVENOUS | Status: DC | PRN

## 2020-05-01 MED ORDER — FENTANYL CITRATE (PF) 100 MCG/2ML IJ SOLN
25.0000 ug | INTRAMUSCULAR | Status: DC | PRN
  Administered 2020-05-01: 50 ug via INTRAVENOUS
  Administered 2020-05-01 (×2): 25 ug via INTRAVENOUS

## 2020-05-01 MED ORDER — METHOCARBAMOL 500 MG PO TABS
ORAL_TABLET | ORAL | Status: AC
  Filled 2020-05-01: qty 1

## 2020-05-01 MED ORDER — POTASSIUM CHLORIDE IN NACL 20-0.9 MEQ/L-% IV SOLN: INTRAVENOUS | Status: DC

## 2020-05-01 MED ORDER — SODIUM CHLORIDE 0.9% FLUSH
3.0000 mL | Freq: Two times a day (BID) | INTRAVENOUS | Status: DC
  Administered 2020-05-01: 3 mL via INTRAVENOUS

## 2020-05-01 MED ORDER — PHENOL 1.4 % MT LIQD: 1.0000 | OROMUCOSAL | Status: DC | PRN

## 2020-05-01 MED ORDER — 0.9 % SODIUM CHLORIDE (POUR BTL) OPTIME
TOPICAL | Status: DC | PRN
  Administered 2020-05-01: 1000 mL

## 2020-05-01 MED ORDER — ROCURONIUM BROMIDE 10 MG/ML (PF) SYRINGE
PREFILLED_SYRINGE | INTRAVENOUS | Status: DC | PRN
  Administered 2020-05-01: 10 mg via INTRAVENOUS
  Administered 2020-05-01: 90 mg via INTRAVENOUS

## 2020-05-01 MED ORDER — ACETAMINOPHEN 650 MG RE SUPP: 650.0000 mg | RECTAL | Status: DC | PRN

## 2020-05-01 MED ORDER — ACETAMINOPHEN 325 MG PO TABS: 650.0000 mg | ORAL_TABLET | ORAL | Status: DC | PRN

## 2020-05-01 MED ORDER — METHOCARBAMOL 500 MG PO TABS
500.0000 mg | ORAL_TABLET | Freq: Four times a day (QID) | ORAL | Status: DC | PRN
  Administered 2020-05-01: 500 mg via ORAL

## 2020-05-01 MED ORDER — CEFAZOLIN SODIUM-DEXTROSE 2-4 GM/100ML-% IV SOLN
2.0000 g | Freq: Three times a day (TID) | INTRAVENOUS | Status: DC
  Administered 2020-05-01: 2 g via INTRAVENOUS
  Filled 2020-05-01: qty 100

## 2020-05-01 MED ORDER — SODIUM CHLORIDE 0.9 % IV SOLN
250.0000 mL | INTRAVENOUS | Status: DC
  Administered 2020-05-01: 250 mL via INTRAVENOUS

## 2020-05-01 MED ORDER — FENTANYL CITRATE (PF) 250 MCG/5ML IJ SOLN
INTRAMUSCULAR | Status: AC
Start: 2020-05-01 — End: ?
  Filled 2020-05-01: qty 5

## 2020-05-01 MED ORDER — SODIUM CHLORIDE 0.9% FLUSH: 3.0000 mL | INTRAVENOUS | Status: DC | PRN

## 2020-05-01 MED ORDER — SENNA 8.6 MG PO TABS: 1.0000 | ORAL_TABLET | Freq: Two times a day (BID) | ORAL | Status: DC

## 2020-05-01 MED ORDER — MENTHOL 3 MG MT LOZG: 1.0000 | LOZENGE | OROMUCOSAL | Status: DC | PRN

## 2020-05-01 MED ORDER — DEXAMETHASONE SODIUM PHOSPHATE 10 MG/ML IJ SOLN
INTRAMUSCULAR | Status: DC | PRN
  Administered 2020-05-01: 5 mg via INTRAVENOUS

## 2020-05-01 MED ORDER — CEFAZOLIN SODIUM-DEXTROSE 2-4 GM/100ML-% IV SOLN
2.0000 g | INTRAVENOUS | Status: AC
  Administered 2020-05-01: 2 g via INTRAVENOUS
  Filled 2020-05-01: qty 100

## 2020-05-01 MED ORDER — HYDROCODONE-ACETAMINOPHEN 5-325 MG PO TABS
1.0000 | ORAL_TABLET | ORAL | Status: DC | PRN
  Administered 2020-05-01: 1 via ORAL
  Filled 2020-05-01: qty 1

## 2020-05-01 MED ORDER — DEXAMETHASONE SODIUM PHOSPHATE 10 MG/ML IJ SOLN
INTRAMUSCULAR | Status: AC
  Filled 2020-05-01: qty 1

## 2020-05-01 MED ORDER — LACTATED RINGERS IV SOLN: INTRAVENOUS | Status: DC | PRN

## 2020-05-01 MED ORDER — ACETAMINOPHEN 500 MG PO TABS
1000.0000 mg | ORAL_TABLET | Freq: Once | ORAL | Status: AC
  Administered 2020-05-01: 1000 mg via ORAL
  Filled 2020-05-01: qty 2

## 2020-05-01 MED ORDER — BACITRACIN ZINC 500 UNIT/GM EX OINT
TOPICAL_OINTMENT | CUTANEOUS | Status: AC
  Filled 2020-05-01: qty 28.35

## 2020-05-01 SURGICAL SUPPLY — 52 items
APL SKNCLS STERI-STRIP NONHPOA (GAUZE/BANDAGES/DRESSINGS) ×1
BAND INSRT 18 STRL LF DISP RB (MISCELLANEOUS) ×2
BAND RUBBER #18 3X1/16 STRL (MISCELLANEOUS) ×6 IMPLANT
BASKET BONE COLLECTION (BASKET) ×2 IMPLANT
BENZOIN TINCTURE PRP APPL 2/3 (GAUZE/BANDAGES/DRESSINGS) ×3 IMPLANT
BIT DRILL OZARK 2.3X14 (BIT) ×2 IMPLANT
BUR CARBIDE MATCH 3.0 (BURR) ×3 IMPLANT
CANISTER SUCT 3000ML PPV (MISCELLANEOUS) ×3 IMPLANT
CLOSURE WOUND 1/2 X4 (GAUZE/BANDAGES/DRESSINGS) ×1
COVER WAND RF STERILE (DRAPES) ×3 IMPLANT
DIFFUSER DRILL AIR PNEUMATIC (MISCELLANEOUS) ×3 IMPLANT
DRAPE C-ARM 42X72 X-RAY (DRAPES) ×6 IMPLANT
DRAPE LAPAROTOMY 100X72 PEDS (DRAPES) ×3 IMPLANT
DRAPE MICROSCOPE LEICA (MISCELLANEOUS) ×3 IMPLANT
DRSG OPSITE POSTOP 4X6 (GAUZE/BANDAGES/DRESSINGS) ×2 IMPLANT
DURAPREP 6ML APPLICATOR 50/CS (WOUND CARE) ×3 IMPLANT
ELECT COATED BLADE 2.86 ST (ELECTRODE) ×3 IMPLANT
ELECT REM PT RETURN 9FT ADLT (ELECTROSURGICAL) ×3
ELECTRODE REM PT RTRN 9FT ADLT (ELECTROSURGICAL) ×1 IMPLANT
GAUZE 4X4 16PLY RFD (DISPOSABLE) IMPLANT
GLOVE BIO SURGEON STRL SZ7 (GLOVE) IMPLANT
GLOVE BIO SURGEON STRL SZ8 (GLOVE) ×3 IMPLANT
GLOVE BIOGEL PI IND STRL 7.0 (GLOVE) IMPLANT
GLOVE BIOGEL PI INDICATOR 7.0 (GLOVE)
GOWN STRL REUS W/ TWL LRG LVL3 (GOWN DISPOSABLE) IMPLANT
GOWN STRL REUS W/ TWL XL LVL3 (GOWN DISPOSABLE) IMPLANT
GOWN STRL REUS W/TWL 2XL LVL3 (GOWN DISPOSABLE) ×3 IMPLANT
GOWN STRL REUS W/TWL LRG LVL3 (GOWN DISPOSABLE)
GOWN STRL REUS W/TWL XL LVL3 (GOWN DISPOSABLE)
HALTER HD/CHIN CERV TRACTION D (MISCELLANEOUS) IMPLANT
HEMOSTAT POWDER KIT SURGIFOAM (HEMOSTASIS) ×3 IMPLANT
KIT BASIN OR (CUSTOM PROCEDURE TRAY) ×3 IMPLANT
KIT TURNOVER KIT B (KITS) ×3 IMPLANT
NDL HYPO 25X1 1.5 SAFETY (NEEDLE) ×1 IMPLANT
NDL SPNL 20GX3.5 QUINCKE YW (NEEDLE) ×1 IMPLANT
NEEDLE HYPO 25X1 1.5 SAFETY (NEEDLE) ×3 IMPLANT
NEEDLE SPNL 20GX3.5 QUINCKE YW (NEEDLE) ×3 IMPLANT
NS IRRIG 1000ML POUR BTL (IV SOLUTION) ×3 IMPLANT
PACK LAMINECTOMY NEURO (CUSTOM PROCEDURE TRAY) ×3 IMPLANT
PAD ARMBOARD 7.5X6 YLW CONV (MISCELLANEOUS) ×9 IMPLANT
PASTE BONE GRAFTON 1CC (Bone Implant) ×2 IMPLANT
PEEK SPACER AVS AS 7X14X16X4% (Peek) ×2 IMPLANT
PIN DISTRACTION 14MM (PIN) ×6 IMPLANT
PLATE ANT CERV OZARK 22 1LVL (Plate) ×2 IMPLANT
SCREW CERV ST OZARD 4X14 (Plate) ×8 IMPLANT
SPONGE INTESTINAL PEANUT (DISPOSABLE) ×3 IMPLANT
STRIP CLOSURE SKIN 1/2X4 (GAUZE/BANDAGES/DRESSINGS) ×2 IMPLANT
SUT VIC AB 3-0 SH 8-18 (SUTURE) ×5 IMPLANT
SUT VICRYL 4-0 PS2 18IN ABS (SUTURE) IMPLANT
TOWEL GREEN STERILE (TOWEL DISPOSABLE) ×3 IMPLANT
TOWEL GREEN STERILE FF (TOWEL DISPOSABLE) ×3 IMPLANT
WATER STERILE IRR 1000ML POUR (IV SOLUTION) ×3 IMPLANT

## 2020-05-01 NOTE — Anesthesia Procedure Notes (Signed)
Procedure Name: Intubation Date/Time: 05/01/2020 7:56 AM Performed by: Inda Coke, CRNA Pre-anesthesia Checklist: Patient identified, Emergency Drugs available, Suction available and Patient being monitored Patient Re-evaluated:Patient Re-evaluated prior to induction Oxygen Delivery Method: Circle System Utilized Preoxygenation: Pre-oxygenation with 100% oxygen Induction Type: IV induction Ventilation: Mask ventilation without difficulty and Oral airway inserted - appropriate to patient size Laryngoscope Size: Glidescope and 4 Grade View: Grade I Tube type: Oral Tube size: 7.5 mm Number of attempts: 1 Airway Equipment and Method: Stylet and Oral airway Placement Confirmation: ETT inserted through vocal cords under direct vision,  positive ETCO2 and breath sounds checked- equal and bilateral Secured at: 23 cm Tube secured with: Tape Dental Injury: Teeth and Oropharynx as per pre-operative assessment

## 2020-05-01 NOTE — Transfer of Care (Signed)
Immediate Anesthesia Transfer of Care Note  Patient: Elijah Taylor  Procedure(s) Performed: Cervical Four-Five Anterior Cervical Decompression/Discectomy/Fusion with removal of Cervical Three-Four Hardware (N/A Spine Cervical)  Patient Location: PACU  Anesthesia Type:General  Level of Consciousness: awake, alert  and oriented  Airway & Oxygen Therapy: Patient Spontanous Breathing and Patient connected to nasal cannula oxygen  Post-op Assessment: Report given to RN, Post -op Vital signs reviewed and stable and Patient moving all extremities X 4  Post vital signs: Reviewed and stable  Last Vitals:  Vitals Value Taken Time  BP 145/95 05/01/20 0933  Temp    Pulse 87 05/01/20 0933  Resp 14 05/01/20 0933  SpO2 100 % 05/01/20 0933  Vitals shown include unvalidated device data.  Last Pain:  Vitals:   05/01/20 0629  PainSc: 0-No pain      Patients Stated Pain Goal: 3 (93/81/01 7510)  Complications: No complications documented.

## 2020-05-01 NOTE — Discharge Instructions (Addendum)
Wound Care Keep incision area dry.  You may remove outer bandage after 2 days and shower.  Do not put any creams, lotions, or ointments on incision. Leave steri-strips on neck.  They will fall off by themselves. Activity Walk each and every day, increasing distance each day. No lifting greater than 5 lbs.  Avoid excessive neck motion. No driving for 2 weeks; may ride as a passenger locally.  Diet Resume your normal diet.   Call Your Doctor If Any of These Occur Redness, drainage, or swelling at the wound.  Temperature greater than 101 degrees. Severe pain not relieved by pain medication. Increased difficulty swallowing.  Incision starts to come apart. Follow Up Appt Call  [phone])  for problems.  If you have any hardware placed in your spine, you will need an x-ray before your appointment.

## 2020-05-01 NOTE — H&P (Signed)
Subjective:   Patient is a 69 y.o. male admitted for neck pain. The patient first presented to me with complaints of neck pain. Onset of symptoms was several months ago. The pain is described as aching and occurs all day. The pain is rated severe, and is located in the neck and radiates to the head and shoulders. The symptoms have been progressive. Symptoms are exacerbated by none, and are relieved by none.  Previous work up includes MRI of cervical spine, results: spinal stenosis.  Past Medical History:  Diagnosis Date  . Arthritis   . Chronic kidney disease    H/O KIDNEY STONES  . History of kidney stones   . Insomnia   . Prostate cancer ([place])   . 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 Wears glasses     Past Surgical History:  Procedure Laterality Date  . CARPAL TUNNEL RELEASE     left  . CARPAL TUNNEL RELEASE Right 12/01/2014   Procedure: RIGHT CARPAL TUNNEL RELEASE;  Surgeon: Daryll Brod, MD;  Location: [place];  Service: Orthopedics;  Laterality: Right;  . CATARACT EXTRACTION W/ INTRAOCULAR LENS IMPLANT Right   . CATARACT EXTRACTION W/ INTRAOCULAR LENS IMPLANT Right   . CERVICAL FUSION  2007  . COLONOSCOPY    . COLONOSCOPY WITH PROPOFOL N/A 11/07/2017   Procedure: COLONOSCOPY WITH PROPOFOL;  Surgeon: [name], MD;  Location: [facility];  Service: Gastroenterology;  Laterality: N/A;  . CYSTOSCOPY MACROPLASTIQUE IMPLANT N/A 09/15/2015   Procedure: CYSTOSCOPY MACROPLASTIQUE IMPLANT;  Surgeon: Royston Cowper, MD;  Location: [facility];  Service: Urology;  Laterality: N/A;  . CYSTOSCOPY/URETEROSCOPY/HOLMIUM LASER/STENT PLACEMENT Left 06/29/2018   Procedure: CYSTOSCOPY/URETEROSCOPY/HOLMIUM LASER/STENT PLACEMENT;  Surgeon: Billey Co, MD;  Location: [facility];  Service: Urology;  Laterality: Left;  . EXTRACORPOREAL SHOCK WAVE LITHOTRIPSY Left 07/12/2018   Procedure: EXTRACORPOREAL SHOCK WAVE LITHOTRIPSY (ESWL);  Surgeon: Billey Co, MD;   Location: [facility];  Service: Urology;  Laterality: Left;  . EXTRACORPOREAL SHOCK WAVE LITHOTRIPSY Right 10/18/2018   Procedure: EXTRACORPOREAL SHOCK WAVE LITHOTRIPSY (ESWL);  Surgeon: Royston Cowper, MD;  Location: [facility];  Service: Urology;  Laterality: Right;  . RETROPUBIC PROSTATECTOMY  2010  . SHOULDER ARTHROSCOPY W/ ROTATOR CUFF REPAIR     right and left  . TRIGGER FINGER RELEASE     x 4  . TRIGGER FINGER RELEASE Left 09/02/2014   Procedure: RELEASE A-1 PULLEY LEFT INDEX FINGER;  Surgeon: Daryll Brod, MD;  Location: [place];  Service: Orthopedics;  Laterality: Left;  ANESTHESIA: IV REGIONAL FAB  . TRIGGER FINGER RELEASE Right 05/16/2017   Procedure: RELEASE TRIGGER FINGER/A-1 PULLEY RIGHT MIDDLE FINGER;  Surgeon: Daryll Brod, MD;  Location: [place];  Service: Orthopedics;  Laterality: Right;  . TRIGGER FINGER RELEASE Right 03/28/2019   Procedure: RELEASE TRIGGER FINGER/A-1 PULLEY RIGHT INDEX;  Surgeon: Daryll Brod, MD;  Location: [place];  Service: Orthopedics;  Laterality: Right;  FAB    No Known Allergies  Social History   Tobacco Use  . Smoking status: Never Smoker  . Smokeless tobacco: Never Used  Substance Use Topics  . Alcohol use: Yes    Alcohol/week: 1.0 standard drink    Types: 1 Glasses of wine per week    Comment: occasionally    Family History  Problem Relation Age of Onset  . Cancer Mother        Brain Tumor  . Kidney failure Father   .  Prostate cancer Father   . Heart disease Brother   . Heart attack Maternal Grandfather   . Prostate cancer Maternal Uncle   . Prostate cancer Maternal Uncle    Prior to Admission medications   Medication Sig Start Date End Date Taking? Authorizing Provider  aspirin EC 81 MG tablet Take 81 mg by mouth daily.   Yes [provider]  diphenhydrAMINE HCl (ZZZQUIL) 50 MG/30ML LIQD Take 25-50 mg by mouth at bedtime as needed (sleep).   Yes [provider]   diphenhydramine-acetaminophen (TYLENOL PM) 25-500 MG TABS tablet Take 1-2 tablets by mouth at bedtime as needed (for sleep).    Yes [provider]  Multiple Vitamins-Minerals (MULTIVITAMIN WITH MINERALS) tablet Take 1 tablet by mouth daily.   Yes [provider]  triazolam (HALCION) 0.125 MG tablet Take 0.25 mg by mouth at bedtime as needed for sleep.    [provider]     Review of Systems  Positive ROS: neg  All other systems have been reviewed and were otherwise negative with the exception of those mentioned in the HPI and as above.  Objective: Vital signs in last 24 hours: Temp:  [98.6 F (37 C)] 98.6 F (37 C) (09/24 0609) Pulse Rate:  [88] 88 (09/24 0609) Resp:  [20] 20 (09/24 0609) BP: (136)/(81) 136/81 (09/24 0609) SpO2:  [97 %] 97 % (09/24 0609) Weight:  [85 kg] 85 kg (09/24 0609)  General Appearance: Alert, cooperative, no distress, appears stated age Head: Normocephalic, without obvious abnormality, atraumatic Eyes: PERRL, conjunctiva/corneas clear, EOM's intact      Neck: Supple, symmetrical, trachea midline, Back: Symmetric, no curvature, ROM normal, no CVA tenderness Lungs:  respirations unlabored Heart: Regular rate and rhythm Abdomen: Soft, non-tender Extremities: Extremities normal, atraumatic, no cyanosis or edema Pulses: 2+ and symmetric all extremities Skin: Skin color, texture, turgor normal, no rashes or lesions  NEUROLOGIC:  Mental status: Alert and oriented x4, no aphasia, good attention span, fund of knowledge and memory  Motor Exam - grossly normal Sensory Exam - grossly normal Reflexes: 1+ Coordination - grossly normal Gait - grossly normal Balance - grossly normal Cranial Nerves: I: smell Not tested  II: visual acuity  OS: nl    OD: nl  II: visual fields Full to confrontation  II: pupils Equal, round, reactive to light  III,VII: ptosis None  III,IV,VI: extraocular muscles  Full ROM  V: mastication Normal  V:  facial light touch sensation  Normal  V,VII: corneal reflex  Present  VII: facial muscle function - upper  Normal  VII: facial muscle function - lower Normal  VIII: hearing Not tested  IX: soft palate elevation  Normal  IX,X: gag reflex Present  XI: trapezius strength  5/5  XI: sternocleidomastoid strength 5/5  XI: neck flexion strength  5/5  XII: tongue strength  Normal    Data Review Lab Results  Component Value Date   WBC 8.9 04/29/2020   HGB 16.7 04/29/2020   HCT 50.3 04/29/2020   MCV 94.2 04/29/2020   PLT 293 04/29/2020   Lab Results  Component Value Date   NA 143 10/20/2018   K 3.8 10/20/2018   CL 108 10/20/2018   CO2 25 10/20/2018   BUN 22 10/20/2018   CREATININE 1.35 (H) 10/20/2018   GLUCOSE 107 (H) 10/20/2018   No results found for: INR, PROTIME  Assessment:   Cervical neck pain with herniated nucleus pulposus/ spondylosis/ stenosis at C4-5. Estimated body mass index is 27.67 kg/m as  calculated from the following:   Height as of this encounter: 5\' 9"  (1.753 m).   Weight as of this encounter: 85 kg.  Patient has failed conservative therapy. Planned surgery : ACDF C4-5  Plan:   I explained the condition and procedure to the patient and answered any questions.  Patient wishes to proceed with procedure as planned. Understands risks/ benefits/ and expected or typical outcomes.  Eustace Moore 05/01/2020 7:33 AM

## 2020-05-01 NOTE — Op Note (Signed)
05/01/2020  9:26 AM  PATIENT:  Elijah Taylor  69 y.o. male  PRE-OPERATIVE DIAGNOSIS: Adjacent level stenosis C4-5 with a neck and shoulder pain  POST-OPERATIVE DIAGNOSIS:  same  PROCEDURE:  1. Decompressive anterior cervical discectomy C4-5, 2. Anterior cervical arthrodesis C4-5 utilizing a peek interbody cage packed with locally harvested morcellized autologous bone graft and DBM putty, 3. Anterior cervical plating C4-5 utilizing a [employer] Ozark plate, 4.  Removal of C3-4 anterior cervical instrumentation  SURGEON:  Sherley Bounds, MD  ASSISTANTS: Glenford Peers, FNP  ANESTHESIA:   General  EBL: 50 ml  Total I/O In: 1200 [I.V.:1100; IV Piggyback:100] Out: 50 [Blood:50]  BLOOD ADMINISTERED: none  DRAINS: none  SPECIMEN:  none  INDICATION FOR PROCEDURE: This patient presented with severe neck pain and shoulder pain. Imaging showed cervical spinal stenosis C4-5 below previous C3-4 ACDF. The patient tried conservative measures without relief. Pain was debilitating. Recommended ACDF with plating. Patient understood the risks, benefits, and alternatives and potential outcomes and wished to proceed.  PROCEDURE DETAILS: Patient was brought to the operating room placed under general endotracheal anesthesia. Patient was placed in the supine position on the operating room table. The neck was prepped with Duraprep and draped in a sterile fashion.   Three cc of local anesthesia was injected and a transverse incision was made on the right side of the neck.  Dissection was carried down thru the subcutaneous tissue and the platysma was  elevated, opened, and undermined with Metzenbaum scissors.  Dissection was then carried out thru an avascular plane leaving the sternocleidomastoid carotid artery and jugular vein laterally and the trachea and esophagus medially with the assistance of my nurse practitioner. The ventral aspect of the vertebral column was identified and the old plate was identified.   Blunt dissection was used to remove tissue from the front of the plate and then the 4 screws were removed and the holes were dried with Surgifoam.  The plate was removed.  And a localizing x-ray was taken. The C4 5 level was identified and all in the room agreed with the level. The longus colli muscles were then elevated and the retractor was placed with the assistance of my nurse practitioner. The annulus was incised and the disc space entered. Discectomy was performed with micro-curettes and pituitary rongeurs. I then used the high-speed drill to drill the endplates down to the level of the posterior longitudinal ligament. The drill shavings were saved in a mucous trap for later arthrodesis. The operating microscope was draped and brought into the field provided additional magnification, illumination and visualization. Discectomy was continued posteriorly thru the disc space. Posterior longitudinal ligament was opened with a nerve hook, and then removed along with disc herniation and osteophytes, decompressing the spinal canal and thecal sac. We then continued to remove osteophytic overgrowth and disc material decompressing the neural foramina and exiting nerve roots bilaterally. The scope was angled up and down to help decompress and undercut the vertebral bodies. Once the decompression was completed we could pass a nerve hook circumferentially to assure adequate decompression in the midline and in the neural foramina. So by both visualization and palpation we felt we had an adequate decompression of the neural elements. We then measured the height of the intravertebral disc space and selected a 7 millimeter peek interbody cage packed with autograft and morselized DBM putty. It was then gently positioned in the intravertebral disc space(s) and countersunk. I then used a 22 mm Ozark plate and placed variable angle screws  into the vertebral bodies of each level and locked them into position. The wound was  irrigated with bacitracin solution, checked for hemostasis which was established and confirmed. Once meticulous hemostasis was achieved, we then proceeded with closure with the assistance of my nurse practitioner. The platysma was closed with interrupted 3-0 undyed Vicryl suture, the subcuticular layer was closed with interrupted 3-0 undyed Vicryl suture. The skin edges were approximated with steristrips. The drapes were removed. A sterile dressing was applied. The patient was then awakened from general anesthesia and transferred to the recovery room in stable condition. At the end of the procedure all sponge, needle and instrument counts were correct.   PLAN OF CARE: Admit for overnight observation  PATIENT DISPOSITION:  PACU - hemodynamically stable.   Delay start of Pharmacological VTE agent (>24hrs) due to surgical blood loss or risk of bleeding:  yes

## 2020-05-01 NOTE — Discharge Summary (Signed)
Physician Discharge Summary  Patient ID: Elijah Taylor MRN: [id number] DOB/AGE: 1951-01-30 69 y.o.  Admit date: 05/01/2020 Discharge date: 05/01/2020  Admission Diagnoses: cervical stenosis    Discharge Diagnoses: same   Discharged Condition: good  Hospital Course: The patient was admitted on 05/01/2020 and taken to the operating room where the patient underwent ACDF C4-5. The patient tolerated the procedure well and was taken to the recovery room and then to the floor in stable condition. The hospital course was routine. There were no complications. The wound remained clean dry and intact. Pt had appropriate nwck soreness. No complaints of arm pain or new N/T/W. The patient remained afebrile with stable vital signs, and tolerated a regular diet. The patient continued to increase activities, and pain was well controlled with oral pain medications.   Consults: None  Significant Diagnostic Studies:  Results for orders placed or performed during the hospital encounter of 05/01/20  ABO/Rh  Result Value Ref Range   ABO/RH(D)      A POS Performed at [facility], [place] [address] [place],  [address]     DG Cervical Spine 1 View  Result Date: 05/01/2020 CLINICAL DATA:  69 year old male undergoing cervical spine surgery. EXAM: DG CERVICAL SPINE - 1 VIEW; DG C-ARM 1-60 MIN COMPARISON:  CT cervical spine 03/06/2020. FLUOROSCOPY TIME:  0 minutes 11 seconds. FINDINGS: Single intraoperative lateral fluoroscopic spot view of the cervical spine demonstrates new C4-C5 ACDF hardware. Pre-existing C3-C4 fusion with solid arthrodesis. No hardware remains at C3. IMPRESSION: Intraoperative image demonstrates cervical spine fusion extended caudally to C4-C5. Electronically Signed   By: Genevie Ann M.D.   On: 05/01/2020 09:34   DG C-Arm 1-60 Min  Result Date: 05/01/2020 CLINICAL DATA:  69 year old male undergoing cervical spine surgery. EXAM: DG CERVICAL SPINE - 1 VIEW; DG C-ARM 1-60 MIN  COMPARISON:  CT cervical spine 03/06/2020. FLUOROSCOPY TIME:  0 minutes 11 seconds. FINDINGS: Single intraoperative lateral fluoroscopic spot view of the cervical spine demonstrates new C4-C5 ACDF hardware. Pre-existing C3-C4 fusion with solid arthrodesis. No hardware remains at C3. IMPRESSION: Intraoperative image demonstrates cervical spine fusion extended caudally to C4-C5. Electronically Signed   By: Genevie Ann M.D.   On: 05/01/2020 09:34    Antibiotics:  Anti-infectives (From admission, onward)   Start     Dose/Rate Route Frequency Ordered Stop   05/01/20 1600  ceFAZolin (ANCEF) IVPB 2g/100 mL premix        2 g 200 mL/hr over 30 Minutes Intravenous Every 8 hours 05/01/20 1226 05/02/20 0759   05/01/20 0615  ceFAZolin (ANCEF) IVPB 2g/100 mL premix        2 g 200 mL/hr over 30 Minutes Intravenous On call to O.R. 05/01/20 0607 05/01/20 0830      Discharge Exam: Blood pressure 136/61, pulse 100, temperature 98.3 F (36.8 C), resp. rate 18, height 5\' 9"  (1.753 m), weight 85 kg, SpO2 98 %. Neurologic: Grossly normal Dressing dry and flat  Discharge Medications:   Allergies as of 05/01/2020   No Known Allergies     Medication List    TAKE these medications   aspirin EC 81 MG tablet Take 81 mg by mouth daily.   diphenhydramine-acetaminophen 25-500 MG Tabs tablet Commonly known as: TYLENOL PM Take 1-2 tablets by mouth at bedtime as needed (for sleep).   methocarbamol 500 MG tablet Commonly known as: ROBAXIN Take 1 tablet (500 mg total) by mouth every 6 (six) hours as needed for muscle spasms.   multivitamin  with minerals tablet Take 1 tablet by mouth daily.   oxyCODONE 15 MG immediate release tablet Commonly known as: Roxicodone Take 1 tablet (15 mg total) by mouth every 6 (six) hours as needed for moderate pain.   triazolam 0.125 MG tablet Commonly known as: HALCION Take 0.25 mg by mouth at bedtime as needed for sleep.   ZzzQuil 50 MG/30ML Liqd Generic drug:  diphenhydrAMINE HCl Take 25-50 mg by mouth at bedtime as needed (sleep).       Disposition: home   Final Dx: ACDF C4-5  Discharge Instructions    Call MD for:  difficulty breathing, headache or visual disturbances   Complete by: As directed    Call MD for:  persistant nausea and vomiting   Complete by: As directed    Call MD for:  redness, tenderness, or signs of infection (pain, swelling, redness, odor or green/yellow discharge around incision site)   Complete by: As directed    Call MD for:  severe uncontrolled pain   Complete by: As directed    Call MD for:  temperature >100.4   Complete by: As directed    Diet - low sodium heart healthy   Complete by: As directed    Increase activity slowly   Complete by: As directed    Remove dressing in 24 hours   Complete by: As directed        Follow-up Information    Eustace Moore, MD. Schedule an appointment as soon as possible for a visit in 2 week(s).   Specialty: Neurosurgery Contact information: [address] [place] [address] [phone]                Signed: Eustace Moore 05/01/2020, 3:05 PM

## 2020-05-01 NOTE — Plan of Care (Signed)
Patient alert and oriented, mae's well, voiding adequate amount of urine, swallowing without difficulty, no c/o pain at time of discharge. Patient discharged home with family. Script and discharged instructions given to patient. Patient and family stated understanding of instructions given. Patient has an appointment with Dr. Jones °

## 2020-05-03 NOTE — Anesthesia Postprocedure Evaluation (Signed)
Anesthesia Post Note  Patient: Darrick Penna Mackley  Procedure(s) Performed: Cervical Four-Five Anterior Cervical Decompression/Discectomy/Fusion with removal of Cervical Three-Four Hardware (N/A Spine Cervical)     Patient location during evaluation: PACU Anesthesia Type: General Level of consciousness: awake Pain management: pain level controlled Vital Signs Assessment: post-procedure vital signs reviewed and stable Respiratory status: spontaneous breathing, nonlabored ventilation, respiratory function stable and patient connected to nasal cannula oxygen Cardiovascular status: blood pressure returned to baseline and stable Postop Assessment: no apparent nausea or vomiting Anesthetic complications: no   No complications documented.  Last Vitals:  Vitals:   05/01/20 1215 05/01/20 1229  BP:  136/61  Pulse:  100  Resp:  18  Temp: 36.8 C   SpO2:  98%    Last Pain:  Vitals:   05/01/20 1546  PainSc: 2                  Alverna Fawley P Markiyah Gahm

## 2020-05-05 ENCOUNTER — Encounter (HOSPITAL_COMMUNITY): Payer: Self-pay | Admitting: Neurological Surgery

## 2020-06-11 DIAGNOSIS — G8929 Other chronic pain: Secondary | ICD-10-CM | POA: Diagnosis not present

## 2020-06-11 DIAGNOSIS — M542 Cervicalgia: Secondary | ICD-10-CM | POA: Diagnosis not present

## 2020-06-11 DIAGNOSIS — M545 Low back pain, unspecified: Secondary | ICD-10-CM | POA: Diagnosis not present

## 2020-06-11 DIAGNOSIS — M5136 Other intervertebral disc degeneration, lumbar region: Secondary | ICD-10-CM | POA: Diagnosis not present

## 2020-06-26 DIAGNOSIS — Z6827 Body mass index (BMI) 27.0-27.9, adult: Secondary | ICD-10-CM | POA: Diagnosis not present

## 2020-06-26 DIAGNOSIS — R030 Elevated blood-pressure reading, without diagnosis of hypertension: Secondary | ICD-10-CM | POA: Diagnosis not present

## 2020-06-26 DIAGNOSIS — M5126 Other intervertebral disc displacement, lumbar region: Secondary | ICD-10-CM | POA: Diagnosis not present

## 2020-07-14 DIAGNOSIS — M5416 Radiculopathy, lumbar region: Secondary | ICD-10-CM | POA: Diagnosis not present

## 2020-07-14 DIAGNOSIS — R030 Elevated blood-pressure reading, without diagnosis of hypertension: Secondary | ICD-10-CM | POA: Diagnosis not present

## 2020-07-14 DIAGNOSIS — Z6827 Body mass index (BMI) 27.0-27.9, adult: Secondary | ICD-10-CM | POA: Diagnosis not present

## 2020-07-30 DIAGNOSIS — M48062 Spinal stenosis, lumbar region with neurogenic claudication: Secondary | ICD-10-CM | POA: Diagnosis not present

## 2020-07-30 DIAGNOSIS — M5416 Radiculopathy, lumbar region: Secondary | ICD-10-CM | POA: Diagnosis not present

## 2020-10-06 DIAGNOSIS — M4726 Other spondylosis with radiculopathy, lumbar region: Secondary | ICD-10-CM | POA: Diagnosis not present

## 2020-10-06 DIAGNOSIS — M5416 Radiculopathy, lumbar region: Secondary | ICD-10-CM | POA: Diagnosis not present

## 2020-10-06 DIAGNOSIS — M48061 Spinal stenosis, lumbar region without neurogenic claudication: Secondary | ICD-10-CM | POA: Diagnosis not present

## 2020-10-06 DIAGNOSIS — M5116 Intervertebral disc disorders with radiculopathy, lumbar region: Secondary | ICD-10-CM | POA: Diagnosis not present

## 2020-10-08 DIAGNOSIS — M5416 Radiculopathy, lumbar region: Secondary | ICD-10-CM | POA: Diagnosis not present

## 2020-10-13 DIAGNOSIS — M545 Low back pain, unspecified: Secondary | ICD-10-CM | POA: Diagnosis not present

## 2020-10-13 DIAGNOSIS — M5416 Radiculopathy, lumbar region: Secondary | ICD-10-CM | POA: Diagnosis not present

## 2020-11-10 DIAGNOSIS — M961 Postlaminectomy syndrome, not elsewhere classified: Secondary | ICD-10-CM | POA: Diagnosis not present

## 2020-11-12 ENCOUNTER — Other Ambulatory Visit: Payer: Self-pay | Admitting: Neurological Surgery

## 2020-11-12 DIAGNOSIS — M961 Postlaminectomy syndrome, not elsewhere classified: Secondary | ICD-10-CM

## 2020-11-19 IMAGING — CR ABDOMEN - 1 VIEW
2 series · 2 of 2 positions shown · non-contrast
Comparison: 07/18/2018 radiographs and earlier.

CLINICAL DATA: 67-year-old male with right flank pain, hematuria,
nausea vomiting. Status post lithotripsy 2 days ago.

EXAM:
ABDOMEN - 1 VIEW

[abdomen kub (1 of 2)]
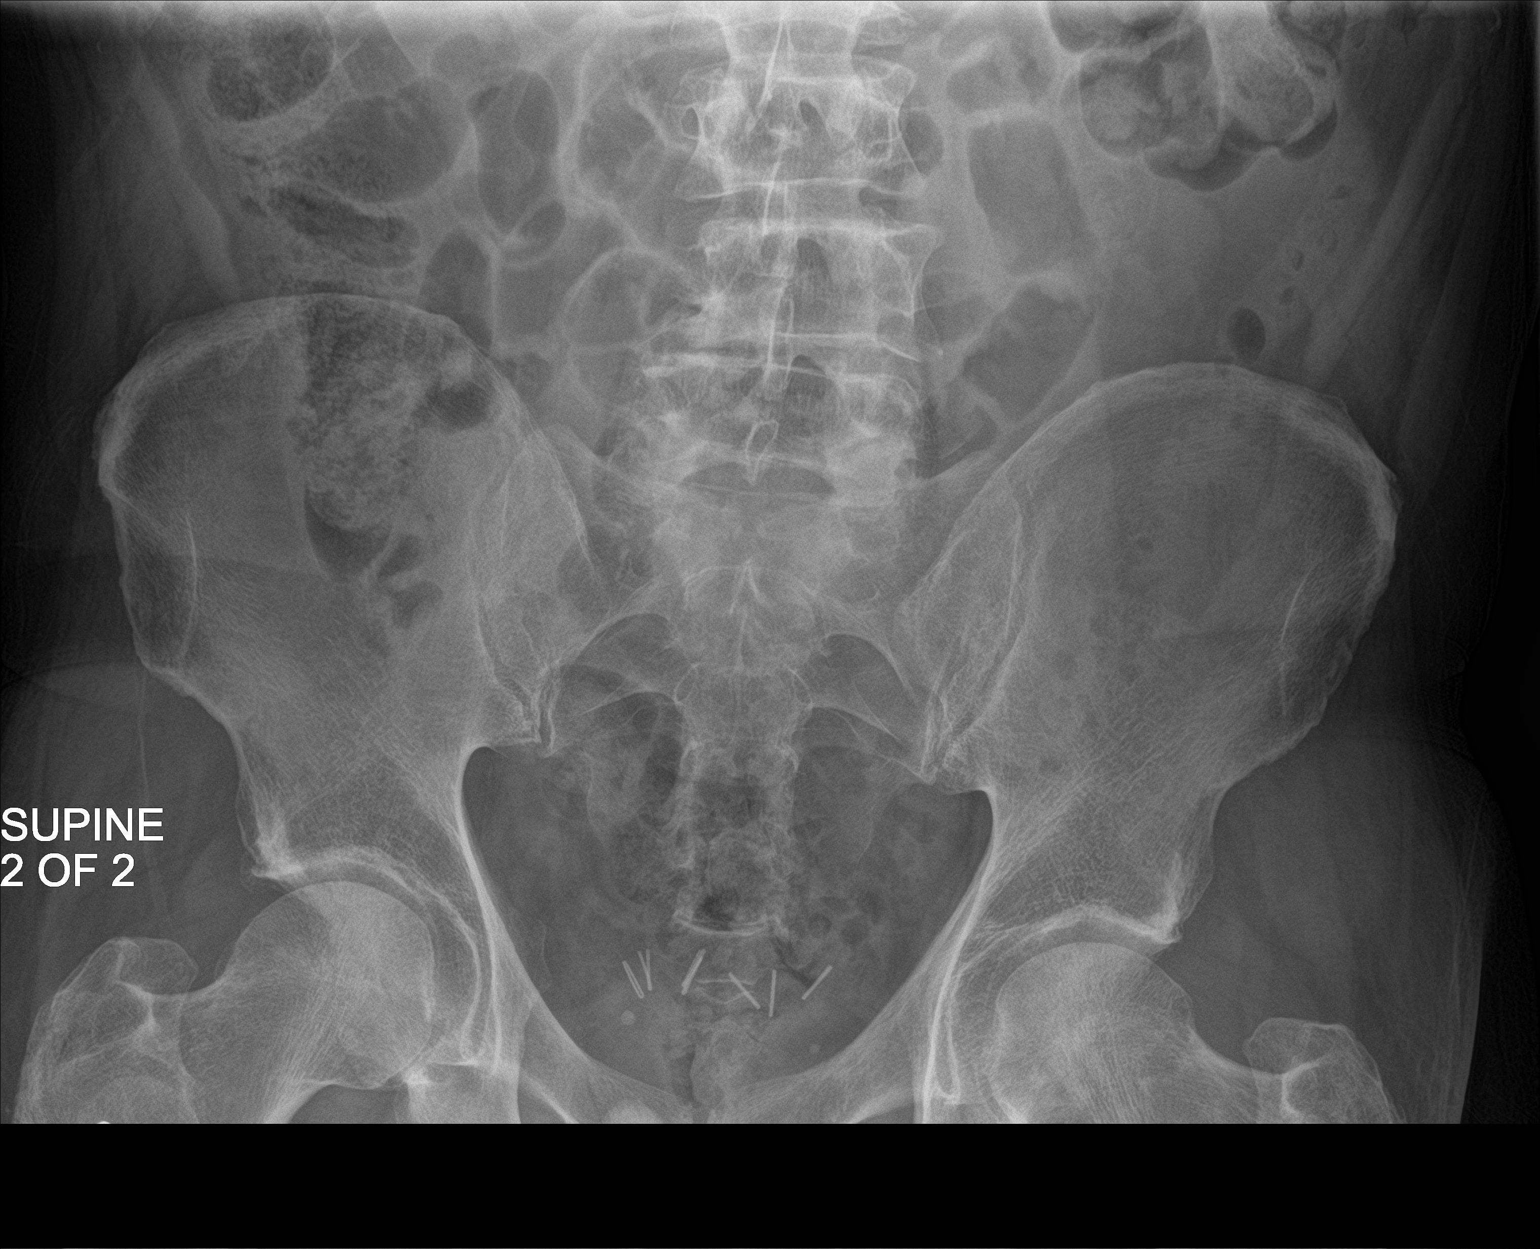

[abdomen kub (2 of 2)]
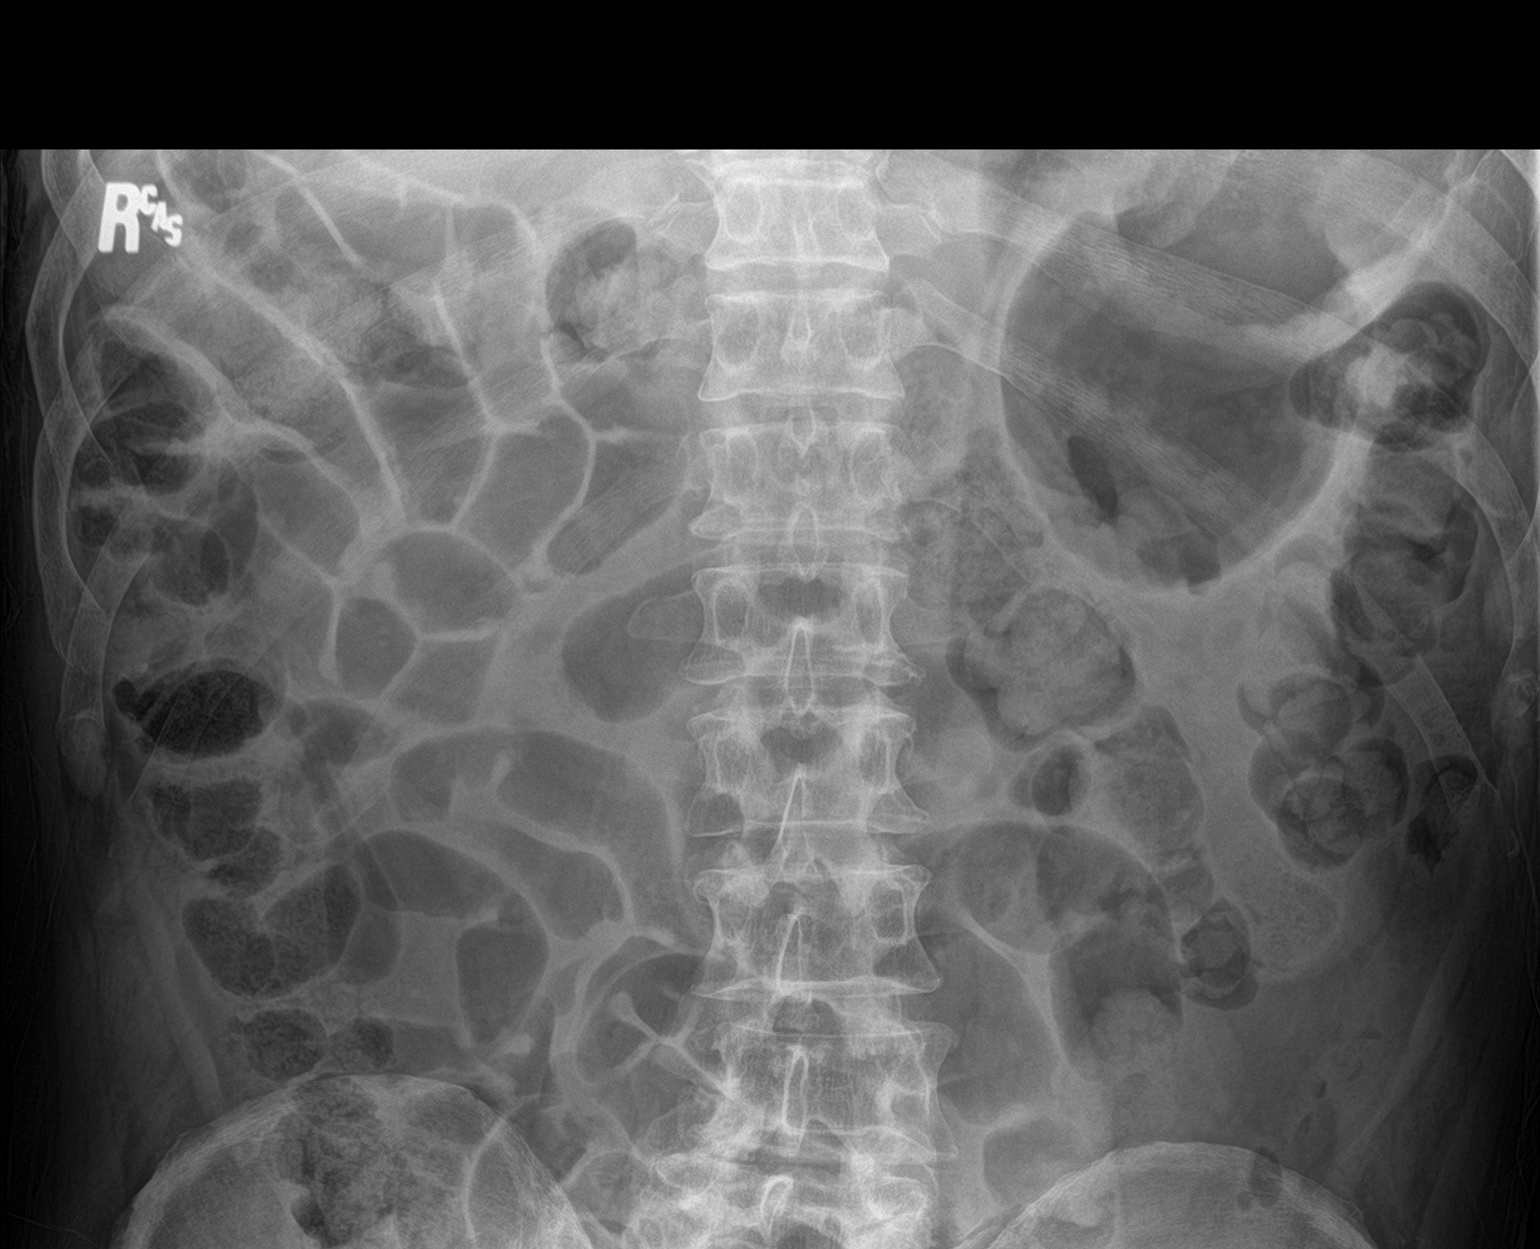

[2 of 2 positions shown; findings below may reference images not displayed]

FINDINGS: Two supine views. Gas-filled nondilated small and large bowel loops
are increased throughout the abdomen. A left double-J ureteral stent
in [REDACTED] has been removed. A 4 millimeter calculus seen at that
time over the right renal shadow might now be located in the right
hemipelvis, no phlebolith was previously seen at that location.
Stable left hemipelvis phlebolith. The left renal shadow today is
mostly obscured by bowel. Also, no residual left nephrolithiasis is
delineated.

Stable visualized osseous structures.
IMPRESSION: 1. Bowel-gas pattern suggestive of ileus.
2. Questionable migration of a right renal calculus to the bladder
since [REDACTED]. Removal of left double-J ureteral stent since
[DATE]. Renal shadows obscured today.  No nephrolithiasis identified.

## 2020-11-24 DIAGNOSIS — D400 Neoplasm of uncertain behavior of prostate: Secondary | ICD-10-CM | POA: Diagnosis not present

## 2020-11-24 DIAGNOSIS — N403 Nodular prostate with lower urinary tract symptoms: Secondary | ICD-10-CM | POA: Diagnosis not present

## 2020-11-24 DIAGNOSIS — C61 Malignant neoplasm of prostate: Secondary | ICD-10-CM | POA: Diagnosis not present

## 2020-11-24 DIAGNOSIS — F5109 Other insomnia not due to a substance or known physiological condition: Secondary | ICD-10-CM | POA: Diagnosis not present

## 2020-11-27 ENCOUNTER — Other Ambulatory Visit
Admission: RE | Admit: 2020-11-27 | Discharge: 2020-11-27 | Disposition: A | Discharge status: Home / Self Care | Payer: Medicare Other | Source: Ambulatory Visit | Attending: Neurological Surgery | Admitting: Neurological Surgery

## 2020-11-27 ENCOUNTER — Other Ambulatory Visit: Payer: Self-pay

## 2020-11-27 ENCOUNTER — Encounter (HOSPITAL_COMMUNITY): Payer: Self-pay | Admitting: Neurological Surgery

## 2020-11-27 DIAGNOSIS — Z01812 Encounter for preprocedural laboratory examination: Secondary | ICD-10-CM | POA: Insufficient documentation

## 2020-11-27 DIAGNOSIS — Z20822 Contact with and (suspected) exposure to covid-19: Secondary | ICD-10-CM | POA: Diagnosis not present

## 2020-11-27 LAB — SARS CORONAVIRUS 2 (TAT 6-24 HRS): SARS Coronavirus 2: NEGATIVE

## 2020-11-27 NOTE — Progress Notes (Signed)
Spoke with pt for pre-op call. Pt denies cardiac history, HTN or Diabetes. Pt states he's a "slight" sorethroat the last couple of days. States that he had some Augmentin left over and has been taking that, only has 2 more doses. He denies any fever. States he feels that it's probably due to the pollen.   Covid test done today, result pending. Pt states he's been in quarantine since the test was done and understands that he stays in quarantine until he comes to the hospital on Monday.

## 2020-11-30 ENCOUNTER — Inpatient Hospital Stay (HOSPITAL_COMMUNITY): Payer: Medicare Other | Admitting: Certified Registered Nurse Anesthetist

## 2020-11-30 ENCOUNTER — Inpatient Hospital Stay (HOSPITAL_COMMUNITY): Payer: Medicare Other

## 2020-11-30 ENCOUNTER — Encounter (HOSPITAL_COMMUNITY): Payer: Self-pay | Admitting: Neurological Surgery

## 2020-11-30 ENCOUNTER — Other Ambulatory Visit: Payer: Self-pay

## 2020-11-30 ENCOUNTER — Inpatient Hospital Stay (HOSPITAL_COMMUNITY)
Admission: RE | Disposition: A | Discharge status: Home / Self Care | Payer: Self-pay | Source: Home / Self Care | Attending: Neurological Surgery

## 2020-11-30 ENCOUNTER — Observation Stay (HOSPITAL_COMMUNITY)
Admission: RE | Admit: 2020-11-30 | Discharge: 2020-12-01 | Disposition: A | Discharge status: Home / Self Care | Payer: Medicare Other | Attending: Neurological Surgery | Admitting: Neurological Surgery

## 2020-11-30 DIAGNOSIS — E7800 Pure hypercholesterolemia, unspecified: Secondary | ICD-10-CM | POA: Diagnosis not present

## 2020-11-30 DIAGNOSIS — Z981 Arthrodesis status: Secondary | ICD-10-CM

## 2020-11-30 DIAGNOSIS — M48061 Spinal stenosis, lumbar region without neurogenic claudication: Secondary | ICD-10-CM | POA: Diagnosis not present

## 2020-11-30 DIAGNOSIS — Z8546 Personal history of malignant neoplasm of prostate: Secondary | ICD-10-CM | POA: Diagnosis not present

## 2020-11-30 DIAGNOSIS — M961 Postlaminectomy syndrome, not elsewhere classified: Secondary | ICD-10-CM | POA: Diagnosis not present

## 2020-11-30 DIAGNOSIS — Z01818 Encounter for other preprocedural examination: Secondary | ICD-10-CM | POA: Diagnosis not present

## 2020-11-30 DIAGNOSIS — Z7982 Long term (current) use of aspirin: Secondary | ICD-10-CM | POA: Insufficient documentation

## 2020-11-30 DIAGNOSIS — J189 Pneumonia, unspecified organism: Secondary | ICD-10-CM | POA: Diagnosis not present

## 2020-11-30 DIAGNOSIS — M5116 Intervertebral disc disorders with radiculopathy, lumbar region: Secondary | ICD-10-CM | POA: Diagnosis not present

## 2020-11-30 DIAGNOSIS — Z419 Encounter for procedure for purposes other than remedying health state, unspecified: Secondary | ICD-10-CM

## 2020-11-30 DIAGNOSIS — M5416 Radiculopathy, lumbar region: Secondary | ICD-10-CM | POA: Diagnosis not present

## 2020-11-30 DIAGNOSIS — M4326 Fusion of spine, lumbar region: Secondary | ICD-10-CM | POA: Diagnosis not present

## 2020-11-30 DIAGNOSIS — C61 Malignant neoplasm of prostate: Secondary | ICD-10-CM | POA: Diagnosis not present

## 2020-11-30 HISTORY — DX: Pneumonia, unspecified organism: J18.9

## 2020-11-30 HISTORY — DX: Other complications of anesthesia, initial encounter: T88.59XA

## 2020-11-30 LAB — CBC WITH DIFFERENTIAL/PLATELET
Abs Immature Granulocytes: 0.09 10*3/uL — ABNORMAL HIGH (ref 0.00–0.07)
Basophils Absolute: 0.1 10*3/uL (ref 0.0–0.1)
Basophils Relative: 1 %
Eosinophils Absolute: 0.1 10*3/uL (ref 0.0–0.5)
Eosinophils Relative: 1 %
HCT: 47.6 % (ref 39.0–52.0)
Hemoglobin: 15.6 g/dL (ref 13.0–17.0)
Immature Granulocytes: 1 %
Lymphocytes Relative: 23 %
Lymphs Abs: 2.9 10*3/uL (ref 0.7–4.0)
MCH: 31.1 pg (ref 26.0–34.0)
MCHC: 32.8 g/dL (ref 30.0–36.0)
MCV: 95 fL (ref 80.0–100.0)
Monocytes Absolute: 1.4 10*3/uL — ABNORMAL HIGH (ref 0.1–1.0)
Monocytes Relative: 11 %
Neutro Abs: 8.3 10*3/uL — ABNORMAL HIGH (ref 1.7–7.7)
Neutrophils Relative %: 63 %
Platelets: 335 10*3/uL (ref 150–400)
RBC: 5.01 MIL/uL (ref 4.22–5.81)
RDW: 12.4 % (ref 11.5–15.5)
WBC: 12.9 10*3/uL — ABNORMAL HIGH (ref 4.0–10.5)
nRBC: 0 % (ref 0.0–0.2)

## 2020-11-30 LAB — TYPE AND SCREEN
ABO/RH(D): A POS
Antibody Screen: NEGATIVE

## 2020-11-30 LAB — BASIC METABOLIC PANEL
Anion gap: 8 (ref 5–15)
BUN: 17 mg/dL (ref 8–23)
CO2: 22 mmol/L (ref 22–32)
Calcium: 9.1 mg/dL (ref 8.9–10.3)
Chloride: 109 mmol/L (ref 98–111)
Creatinine, Ser: 1.13 mg/dL (ref 0.61–1.24)
GFR, Estimated: >60 mL/min (ref >60)
Glucose, Bld: 90 mg/dL (ref 70–99)
Potassium: 4.3 mmol/L (ref 3.5–5.1)
Sodium: 139 mmol/L (ref 135–145)

## 2020-11-30 LAB — PROTIME-INR
INR: 1.1 (ref 0.8–1.2)
Prothrombin Time: 14.4 seconds (ref 11.4–15.2)

## 2020-11-30 SURGERY — POSTERIOR LUMBAR FUSION 2 LEVEL
Anesthesia: General | Site: Back

## 2020-11-30 MED ORDER — FENTANYL CITRATE (PF) 250 MCG/5ML IJ SOLN
INTRAMUSCULAR | Status: AC
  Filled 2020-11-30: qty 5

## 2020-11-30 MED ORDER — BUPIVACAINE HCL (PF) 0.25 % IJ SOLN
INTRAMUSCULAR | Status: DC | PRN
  Administered 2020-11-30: 4 mL

## 2020-11-30 MED ORDER — ONDANSETRON HCL 4 MG PO TABS: 4.0000 mg | ORAL_TABLET | Freq: Four times a day (QID) | ORAL | Status: DC | PRN

## 2020-11-30 MED ORDER — CHLORHEXIDINE GLUCONATE 0.12 % MT SOLN
15.0000 mL | Freq: Once | OROMUCOSAL | Status: AC
Start: 2020-11-30 — End: 2020-11-30
  Administered 2020-11-30: 15 mL via OROMUCOSAL
  Filled 2020-11-30: qty 15

## 2020-11-30 MED ORDER — ACETAMINOPHEN 650 MG RE SUPP: 650.0000 mg | RECTAL | Status: DC | PRN

## 2020-11-30 MED ORDER — CHLORHEXIDINE GLUCONATE CLOTH 2 % EX PADS: 6.0000 | MEDICATED_PAD | Freq: Once | CUTANEOUS | Status: DC

## 2020-11-30 MED ORDER — DEXAMETHASONE SODIUM PHOSPHATE 4 MG/ML IJ SOLN
4.0000 mg | Freq: Four times a day (QID) | INTRAMUSCULAR | Status: DC
  Administered 2020-11-30 (×2): 4 mg via INTRAVENOUS
  Filled 2020-11-30 (×2): qty 1

## 2020-11-30 MED ORDER — HYDROMORPHONE HCL 1 MG/ML IJ SOLN
0.5000 mg | INTRAMUSCULAR | Status: DC | PRN
  Administered 2020-11-30: 0.5 mg via INTRAVENOUS
  Filled 2020-11-30: qty 0.5

## 2020-11-30 MED ORDER — ALBUMIN HUMAN 5 % IV SOLN: INTRAVENOUS | Status: DC | PRN

## 2020-11-30 MED ORDER — ACETAMINOPHEN 500 MG PO TABS
1000.0000 mg | ORAL_TABLET | ORAL | Status: AC
  Administered 2020-11-30: 1000 mg via ORAL
  Filled 2020-11-30: qty 2

## 2020-11-30 MED ORDER — ACETAMINOPHEN 325 MG PO TABS
650.0000 mg | ORAL_TABLET | ORAL | Status: DC | PRN
  Administered 2020-11-30 (×2): 650 mg via ORAL
  Filled 2020-11-30 (×2): qty 2

## 2020-11-30 MED ORDER — SODIUM CHLORIDE 0.9 % IV SOLN: 250.0000 mL | INTRAVENOUS | Status: DC

## 2020-11-30 MED ORDER — CELECOXIB 200 MG PO CAPS
200.0000 mg | ORAL_CAPSULE | Freq: Two times a day (BID) | ORAL | Status: DC
  Administered 2020-11-30: 200 mg via ORAL
  Filled 2020-11-30: qty 1

## 2020-11-30 MED ORDER — DEXAMETHASONE 4 MG PO TABS
4.0000 mg | ORAL_TABLET | Freq: Four times a day (QID) | ORAL | Status: DC
  Administered 2020-12-01: 4 mg via ORAL
  Filled 2020-11-30: qty 1

## 2020-11-30 MED ORDER — METHOCARBAMOL 500 MG PO TABS
500.0000 mg | ORAL_TABLET | Freq: Four times a day (QID) | ORAL | Status: DC | PRN
  Administered 2020-11-30 – 2020-12-01 (×3): 500 mg via ORAL
  Filled 2020-11-30 (×2): qty 1

## 2020-11-30 MED ORDER — ROCURONIUM BROMIDE 10 MG/ML (PF) SYRINGE
PREFILLED_SYRINGE | INTRAVENOUS | Status: DC | PRN
  Administered 2020-11-30: 60 mg via INTRAVENOUS
  Administered 2020-11-30 (×2): 20 mg via INTRAVENOUS

## 2020-11-30 MED ORDER — PROPOFOL 10 MG/ML IV BOLUS
INTRAVENOUS | Status: AC
  Filled 2020-11-30: qty 20

## 2020-11-30 MED ORDER — POTASSIUM CHLORIDE IN NACL 20-0.9 MEQ/L-% IV SOLN
INTRAVENOUS | Status: DC
  Filled 2020-11-30: qty 1000

## 2020-11-30 MED ORDER — STERILE WATER FOR IRRIGATION IR SOLN
Status: DC | PRN
  Administered 2020-11-30: 1000 mL

## 2020-11-30 MED ORDER — FENTANYL CITRATE (PF) 250 MCG/5ML IJ SOLN
INTRAMUSCULAR | Status: DC | PRN
  Administered 2020-11-30: 100 ug via INTRAVENOUS
  Administered 2020-11-30: 50 ug via INTRAVENOUS
  Administered 2020-11-30 (×4): 25 ug via INTRAVENOUS
  Administered 2020-11-30: 150 ug via INTRAVENOUS

## 2020-11-30 MED ORDER — ONDANSETRON HCL 4 MG/2ML IJ SOLN
INTRAMUSCULAR | Status: DC | PRN
  Administered 2020-11-30: 4 mg via INTRAVENOUS

## 2020-11-30 MED ORDER — SODIUM CHLORIDE 0.9% FLUSH: 3.0000 mL | INTRAVENOUS | Status: DC | PRN

## 2020-11-30 MED ORDER — LACTATED RINGERS IV SOLN: INTRAVENOUS | Status: DC

## 2020-11-30 MED ORDER — SUGAMMADEX SODIUM 200 MG/2ML IV SOLN
INTRAVENOUS | Status: DC | PRN
  Administered 2020-11-30: 200 mg via INTRAVENOUS

## 2020-11-30 MED ORDER — ORAL CARE MOUTH RINSE
15.0000 mL | Freq: Once | OROMUCOSAL | Status: AC
Start: 2020-11-30 — End: 2020-11-30

## 2020-11-30 MED ORDER — MENTHOL 3 MG MT LOZG: 1.0000 | LOZENGE | OROMUCOSAL | Status: DC | PRN

## 2020-11-30 MED ORDER — THROMBIN 20000 UNITS EX SOLR
CUTANEOUS | Status: DC | PRN
  Administered 2020-11-30: 20 mL via TOPICAL

## 2020-11-30 MED ORDER — BUPIVACAINE HCL (PF) 0.25 % IJ SOLN
INTRAMUSCULAR | Status: AC
  Filled 2020-11-30: qty 30

## 2020-11-30 MED ORDER — PHENYLEPHRINE 40 MCG/ML (10ML) SYRINGE FOR IV PUSH (FOR BLOOD PRESSURE SUPPORT)
PREFILLED_SYRINGE | INTRAVENOUS | Status: DC | PRN
  Administered 2020-11-30: 80 ug via INTRAVENOUS
  Administered 2020-11-30: 120 ug via INTRAVENOUS
  Administered 2020-11-30 (×2): 80 ug via INTRAVENOUS

## 2020-11-30 MED ORDER — DEXAMETHASONE SODIUM PHOSPHATE 10 MG/ML IJ SOLN
INTRAMUSCULAR | Status: AC
  Filled 2020-11-30: qty 1

## 2020-11-30 MED ORDER — FENTANYL CITRATE (PF) 100 MCG/2ML IJ SOLN
INTRAMUSCULAR | Status: AC
  Filled 2020-11-30: qty 2

## 2020-11-30 MED ORDER — HYDROMORPHONE HCL 1 MG/ML IJ SOLN
INTRAMUSCULAR | Status: AC
  Filled 2020-11-30: qty 1

## 2020-11-30 MED ORDER — ONDANSETRON HCL 4 MG/2ML IJ SOLN: 4.0000 mg | Freq: Four times a day (QID) | INTRAMUSCULAR | Status: DC | PRN

## 2020-11-30 MED ORDER — CEFAZOLIN SODIUM-DEXTROSE 2-4 GM/100ML-% IV SOLN
2.0000 g | INTRAVENOUS | Status: AC
  Administered 2020-11-30: 2 g via INTRAVENOUS
  Filled 2020-11-30: qty 100

## 2020-11-30 MED ORDER — ONDANSETRON HCL 4 MG/2ML IJ SOLN
INTRAMUSCULAR | Status: AC
  Filled 2020-11-30: qty 2

## 2020-11-30 MED ORDER — THROMBIN 5000 UNITS EX SOLR
CUTANEOUS | Status: AC
  Filled 2020-11-30: qty 5000

## 2020-11-30 MED ORDER — PHENYLEPHRINE 40 MCG/ML (10ML) SYRINGE FOR IV PUSH (FOR BLOOD PRESSURE SUPPORT)
PREFILLED_SYRINGE | INTRAVENOUS | Status: AC
  Filled 2020-11-30: qty 20

## 2020-11-30 MED ORDER — CEFAZOLIN SODIUM-DEXTROSE 2-4 GM/100ML-% IV SOLN
2.0000 g | Freq: Three times a day (TID) | INTRAVENOUS | Status: AC
  Administered 2020-11-30 – 2020-12-01 (×2): 2 g via INTRAVENOUS
  Filled 2020-11-30 (×2): qty 100

## 2020-11-30 MED ORDER — SENNA 8.6 MG PO TABS
1.0000 | ORAL_TABLET | Freq: Two times a day (BID) | ORAL | Status: DC
  Administered 2020-11-30: 8.6 mg via ORAL
  Filled 2020-11-30: qty 1

## 2020-11-30 MED ORDER — METHOCARBAMOL 1000 MG/10ML IJ SOLN
500.0000 mg | Freq: Four times a day (QID) | INTRAVENOUS | Status: DC | PRN
  Filled 2020-11-30: qty 5

## 2020-11-30 MED ORDER — THROMBIN (RECOMBINANT) 20000 UNITS EX SOLR
CUTANEOUS | Status: AC
  Filled 2020-11-30: qty 20000

## 2020-11-30 MED ORDER — GABAPENTIN 300 MG PO CAPS
300.0000 mg | ORAL_CAPSULE | ORAL | Status: AC
  Administered 2020-11-30: 300 mg via ORAL
  Filled 2020-11-30: qty 1

## 2020-11-30 MED ORDER — PROPOFOL 10 MG/ML IV BOLUS
INTRAVENOUS | Status: DC | PRN
  Administered 2020-11-30: 100 mg via INTRAVENOUS

## 2020-11-30 MED ORDER — PHENYLEPHRINE HCL-NACL 10-0.9 MG/250ML-% IV SOLN
INTRAVENOUS | Status: DC | PRN
  Administered 2020-11-30: 20 ug/min via INTRAVENOUS

## 2020-11-30 MED ORDER — 0.9 % SODIUM CHLORIDE (POUR BTL) OPTIME
TOPICAL | Status: DC | PRN
  Administered 2020-11-30: 1000 mL

## 2020-11-30 MED ORDER — AMISULPRIDE (ANTIEMETIC) 5 MG/2ML IV SOLN: 10.0000 mg | Freq: Once | INTRAVENOUS | Status: DC | PRN

## 2020-11-30 MED ORDER — FENTANYL CITRATE (PF) 100 MCG/2ML IJ SOLN
25.0000 ug | INTRAMUSCULAR | Status: DC | PRN
  Administered 2020-11-30 (×2): 50 ug via INTRAVENOUS

## 2020-11-30 MED ORDER — METHOCARBAMOL 500 MG PO TABS
ORAL_TABLET | ORAL | Status: AC
  Filled 2020-11-30: qty 1

## 2020-11-30 MED ORDER — OXYCODONE HCL 5 MG PO TABS
15.0000 mg | ORAL_TABLET | Freq: Four times a day (QID) | ORAL | Status: DC | PRN
  Administered 2020-11-30 – 2020-12-01 (×3): 15 mg via ORAL
  Filled 2020-11-30 (×3): qty 3

## 2020-11-30 MED ORDER — DEXAMETHASONE SODIUM PHOSPHATE 10 MG/ML IJ SOLN
10.0000 mg | Freq: Once | INTRAMUSCULAR | Status: DC
  Filled 2020-11-30: qty 1

## 2020-11-30 MED ORDER — ROCURONIUM BROMIDE 10 MG/ML (PF) SYRINGE
PREFILLED_SYRINGE | INTRAVENOUS | Status: AC
  Filled 2020-11-30: qty 10

## 2020-11-30 MED ORDER — ONDANSETRON HCL 4 MG/2ML IJ SOLN: 4.0000 mg | Freq: Once | INTRAMUSCULAR | Status: DC | PRN

## 2020-11-30 MED ORDER — DEXAMETHASONE SODIUM PHOSPHATE 10 MG/ML IJ SOLN
INTRAMUSCULAR | Status: DC | PRN
  Administered 2020-11-30: 10 mg via INTRAVENOUS

## 2020-11-30 MED ORDER — HYDROMORPHONE HCL 1 MG/ML IJ SOLN
0.5000 mg | INTRAMUSCULAR | Status: AC | PRN
  Administered 2020-11-30 (×4): 0.5 mg via INTRAVENOUS

## 2020-11-30 MED ORDER — PHENOL 1.4 % MT LIQD: 1.0000 | OROMUCOSAL | Status: DC | PRN

## 2020-11-30 MED ORDER — LIDOCAINE 2% (20 MG/ML) 5 ML SYRINGE
INTRAMUSCULAR | Status: DC | PRN
  Administered 2020-11-30: 60 mg via INTRAVENOUS

## 2020-11-30 MED ORDER — LIDOCAINE 2% (20 MG/ML) 5 ML SYRINGE
INTRAMUSCULAR | Status: AC
  Filled 2020-11-30: qty 5

## 2020-11-30 MED ORDER — SODIUM CHLORIDE 0.9% FLUSH: 3.0000 mL | Freq: Two times a day (BID) | INTRAVENOUS | Status: DC

## 2020-11-30 MED ORDER — THROMBIN 5000 UNITS EX SOLR
OROMUCOSAL | Status: DC | PRN
  Administered 2020-11-30: 10 mL via TOPICAL

## 2020-11-30 MED ORDER — ASPIRIN EC 81 MG PO TBEC
81.0000 mg | DELAYED_RELEASE_TABLET | Freq: Every day | ORAL | Status: DC
  Administered 2020-11-30: 81 mg via ORAL
  Filled 2020-11-30: qty 1

## 2020-11-30 MED ORDER — PHENYLEPHRINE HCL (PRESSORS) 10 MG/ML IV SOLN
INTRAVENOUS | Status: AC
  Filled 2020-11-30: qty 1

## 2020-11-30 MED ORDER — FENTANYL CITRATE (PF) 250 MCG/5ML IJ SOLN
INTRAMUSCULAR | Status: AC
Start: 2020-11-30 — End: ?
  Filled 2020-11-30: qty 5

## 2020-11-30 SURGICAL SUPPLY — 64 items
ADH SKN CLS APL DERMABOND .7 (GAUZE/BANDAGES/DRESSINGS) ×1
APL SKNCLS STERI-STRIP NONHPOA (GAUZE/BANDAGES/DRESSINGS) ×1
BASKET BONE COLLECTION (BASKET) ×2 IMPLANT
BENZOIN TINCTURE PRP APPL 2/3 (GAUZE/BANDAGES/DRESSINGS) ×2 IMPLANT
BLADE CLIPPER SURG (BLADE) IMPLANT
BUR CARBIDE MATCH 3.0 (BURR) ×2 IMPLANT
CANISTER SUCT 3000ML PPV (MISCELLANEOUS) ×2 IMPLANT
CNTNR URN SCR LID CUP LEK RST (MISCELLANEOUS) ×1 IMPLANT
CONT SPEC 4OZ STRL OR WHT (MISCELLANEOUS) ×2
COVER BACK TABLE 60X90IN (DRAPES) ×2 IMPLANT
COVER WAND RF STERILE (DRAPES) ×2 IMPLANT
DERMABOND ADVANCED (GAUZE/BANDAGES/DRESSINGS) ×1
DERMABOND ADVANCED .7 DNX12 (GAUZE/BANDAGES/DRESSINGS) ×1 IMPLANT
DIFFUSER DRILL AIR PNEUMATIC (MISCELLANEOUS) ×2 IMPLANT
DRAPE C-ARM 42X72 X-RAY (DRAPES) ×4 IMPLANT
DRAPE C-ARMOR (DRAPES) IMPLANT
DRAPE LAPAROTOMY 100X72X124 (DRAPES) ×2 IMPLANT
DRAPE SURG 17X23 STRL (DRAPES) ×2 IMPLANT
DRSG OPSITE POSTOP 4X8 (GAUZE/BANDAGES/DRESSINGS) ×1 IMPLANT
DURAPREP 26ML APPLICATOR (WOUND CARE) ×2 IMPLANT
ELECT REM PT RETURN 9FT ADLT (ELECTROSURGICAL) ×2
ELECTRODE REM PT RTRN 9FT ADLT (ELECTROSURGICAL) ×1 IMPLANT
EVACUATOR 1/8 PVC DRAIN (DRAIN) ×2 IMPLANT
GAUZE 4X4 16PLY RFD (DISPOSABLE) IMPLANT
GLOVE BIO SURGEON STRL SZ7 (GLOVE) IMPLANT
GLOVE BIO SURGEON STRL SZ8 (GLOVE) ×4 IMPLANT
GLOVE SURG UNDER POLY LF SZ7 (GLOVE) IMPLANT
GOWN STRL REUS W/ TWL LRG LVL3 (GOWN DISPOSABLE) IMPLANT
GOWN STRL REUS W/ TWL XL LVL3 (GOWN DISPOSABLE) ×2 IMPLANT
GOWN STRL REUS W/TWL 2XL LVL3 (GOWN DISPOSABLE) IMPLANT
GOWN STRL REUS W/TWL LRG LVL3 (GOWN DISPOSABLE)
GOWN STRL REUS W/TWL XL LVL3 (GOWN DISPOSABLE) ×4
GRAFT TRIN ELITE MED MUSC TRAN (Graft) ×1 IMPLANT
HEMOSTAT POWDER KIT SURGIFOAM (HEMOSTASIS) IMPLANT
KIT BASIN OR (CUSTOM PROCEDURE TRAY) ×2 IMPLANT
KIT TURNOVER KIT B (KITS) ×2 IMPLANT
MILL MEDIUM DISP (BLADE) IMPLANT
NDL HYPO 25X1 1.5 SAFETY (NEEDLE) ×1 IMPLANT
NEEDLE HYPO 25X1 1.5 SAFETY (NEEDLE) ×2 IMPLANT
NS IRRIG 1000ML POUR BTL (IV SOLUTION) ×2 IMPLANT
PACK LAMINECTOMY NEURO (CUSTOM PROCEDURE TRAY) ×2 IMPLANT
PAD ARMBOARD 7.5X6 YLW CONV (MISCELLANEOUS) ×6 IMPLANT
PUTTY BONE 2.5CC ×2 IMPLANT
ROD LORD LIPPED TI 5.5X70 (Rod) ×2 IMPLANT
SCREW CANC SHANK MOD 6.5X45 (Screw) ×1 IMPLANT
SCREW CORT SHANK MOD 6.5X40 (Screw) ×3 IMPLANT
SCREW POLYAXIAL TULIP (Screw) ×6 IMPLANT
SCREW SHANK MOD 5.5X40 (Screw) ×2 IMPLANT
SET SCREW (Screw) ×12 IMPLANT
SET SCREW SPNE (Screw) IMPLANT
SPACER IDENTITI 9X10X25 10D (Spacer) ×1 IMPLANT
SPACER PEEK PS 25X9MM 9MM 5DEG (Spacer) ×2 IMPLANT
SPONGE LAP 4X18 RFD (DISPOSABLE) IMPLANT
SPONGE SURGIFOAM ABS GEL 100 (HEMOSTASIS) ×2 IMPLANT
STRIP CLOSURE SKIN 1/2X4 (GAUZE/BANDAGES/DRESSINGS) ×4 IMPLANT
SUT VIC AB 0 CT1 18XCR BRD8 (SUTURE) ×1 IMPLANT
SUT VIC AB 0 CT1 8-18 (SUTURE) ×2
SUT VIC AB 2-0 CP2 18 (SUTURE) ×2 IMPLANT
SUT VIC AB 3-0 SH 8-18 (SUTURE) ×5 IMPLANT
SYR CONTROL 10ML LL (SYRINGE) ×2 IMPLANT
TOWEL GREEN STERILE (TOWEL DISPOSABLE) ×2 IMPLANT
TOWEL GREEN STERILE FF (TOWEL DISPOSABLE) ×2 IMPLANT
TRAY FOLEY MTR SLVR 16FR STAT (SET/KITS/TRAYS/PACK) ×2 IMPLANT
WATER STERILE IRR 1000ML POUR (IV SOLUTION) ×2 IMPLANT

## 2020-11-30 NOTE — Transfer of Care (Signed)
Immediate Anesthesia Transfer of Care Note  Patient: Elijah Taylor  Procedure(s) Performed: PLIF - L3-L4 - L4-L5 - (N/A Back)  Patient Location: PACU  Anesthesia Type:General  Level of Consciousness: awake, alert  and oriented  Airway & Oxygen Therapy: Patient Spontanous Breathing and Patient connected to nasal cannula oxygen  Post-op Assessment: Report given to RN and Post -op Vital signs reviewed and stable  Post vital signs: Reviewed and stable  Last Vitals:  Vitals Value Taken Time  BP 134/75 11/30/20 1502  Temp    Pulse 108 11/30/20 1504  Resp 17 11/30/20 1504  SpO2 96 % 11/30/20 1504  Vitals shown include unvalidated device data.  Last Pain:  Vitals:   11/30/20 1027  TempSrc:   PainSc: 0-No pain         Complications: No complications documented.

## 2020-11-30 NOTE — Op Note (Signed)
11/30/2020  2:46 PM  PATIENT:  Elijah Taylor  70 y.o. male  PRE-OPERATIVE DIAGNOSIS: Failed back syndrome, degenerative disc disease, back pain, radiculopathy, spinal stenosis  POST-OPERATIVE DIAGNOSIS:  same  PROCEDURE:   1. Decompressive lumbar laminectomy Hemi facetectomy foraminotomies L4-5 bilaterally and L3-4 on the left requiring more work than would be required for a simple exposure of the disk for PLIF in order to adequately decompress the neural elements and address the spinal stenosis 2. Posterior lumbar interbody fusion L4-5 using peek interbody cages packed with morcellized allograft and autograft  3.  Transforaminal lumbar interbody fusion L3-4 utilizing a porous titanium interbody cage packed with morselized allograft and local autograft 4. Posterior fixation L3-L5 inclusive using atec cortical pedicle screws.  5. Intertransverse arthrodesis L3-5 using morcellized autograft and allograft.  SURGEON:  Sherley Bounds, MD  ASSISTANTS: Margo Aye FNP  ANESTHESIA:  General  EBL: 100 ml  Total I/O In: 1500 [I.V.:1000; IV [id number]:253] Out: 165 [Urine:65; Blood:100]  BLOOD ADMINISTERED:none  DRAINS: none   INDICATION FOR PROCEDURE: This patient presented with severe back and right anterior thigh pain.  He had a previous decompressive laminectomy at L3-4 on the right.. Imaging revealed significant degenerative disc disease at both levels with probable recurrent disc herniation L3-4 with Modic endplate changes at G6-4.  There was some lateral recess stenosis at L4-5.Marland Kitchen The patient tried a reasonable attempt at conservative medical measures without relief. I recommended decompression and instrumented fusion to address the stenosis as well as the segmental  instability.  Patient understood the risks, benefits, and alternatives and potential outcomes and wished to proceed.  PROCEDURE DETAILS:  The patient was brought to the operating room. After induction of generalized  endotracheal anesthesia the patient was rolled into the prone position on chest rolls and all pressure points were padded. The patient's lumbar region was cleaned and then prepped with DuraPrep and draped in the usual sterile fashion. Anesthesia was injected and then a dorsal midline incision was made and carried down to the lumbosacral fascia. The fascia was opened and the paraspinous musculature was taken down in a subperiosteal fashion to expose L3-4 and L4-5. A self-retaining retractor was placed. Intraoperative fluoroscopy confirmed my level, and I started with placement of the L3 cortical pedicle screws. The pedicle screw entry zones were identified utilizing surface landmarks and  AP and lateral fluoroscopy. I scored the cortex with the high-speed drill and then used the hand drill to drill an upward and outward direction into the pedicle. I then tapped line to line. I then placed a 6.5 x 40 mm cortical pedicle screw into the pedicles of L3 bilaterally.  I then dissected in a suprafascial plane to expose the iliac crest.  Opened the fascia and we used a Jamshidi needle to extract 60 cc of bone marrow aspirate from the iliac crest with the assistance of my nurse practitioner.  This was then spun down by [facility] device and 2 to 4 cc of  BMAC was soaked on morselized allograft for later arthrodesis.  I dried the hole with Surgifoam and closed the fascia.  I then turned my attention to the decompression and complete lumbar laminectomies, hemi- facetectomies, and foraminotomies were performed at L3-4 on the left and L4-5 bilaterally.  My nurse practitioner was directly involved in the decompression and exposure of the neural elements. the patient had significant spinal stenosis and this required more work than would be required for a simple exposure of the disc for posterior lumbar interbody fusion  which would only require a limited laminotomy. Much more generous decompression and generous foraminotomy was  undertaken in order to adequately decompress the neural elements and address the patient's leg pain. The yellow ligament was removed to expose the underlying dura and nerve roots, and generous foraminotomies were performed to adequately decompress the neural elements. Both the exiting and traversing nerve roots were decompressed on both sides until a coronary dilator passed easily along the nerve roots. Once the decompression was complete, I turned my attention to the posterior lower lumbar interbody fusion. The epidural venous vasculature was coagulated and cut sharply. Disc space was incised and the initial discectomy was performed with pituitary rongeurs. The disc space was distracted with sequential distractors to a height of 9 mm at L4-5 and 11 mm at L3-4. We then used a series of scrapers and shavers to prepare the endplates for fusion. The midline was prepared with Epstein curettes. Once the complete discectomy was finished, we packed an appropriate sized interbody cage with local autograft and morcellized allograft, gently retracted the nerve root, and tapped the cage into position at L4-5.  The midline between the cages was packed with morselized autograft and allograft.  At L3-4 we performed a left transforaminal interbody fusion.  The disc base was prepared in the same way and the endplates were prepared.  The 9 mm 10 degree trial fit the best which was 11 tall in the front.  Packed the disc space with morselized allograft and local autograft and then tapped the cage and from the left intervals across the midline and then remove the inserter and tapped the cage downward to square it up across the midline.  Then packed behind the cage with morselized allograft and autograft.  We then turned our attention to the placement of the lower pedicle screws. The pedicle screw entry zones were identified utilizing surface landmarks and fluoroscopy. I drilled into each pedicle utilizing the hand drill, and tapped  each pedicle with the appropriate tap. We palpated with a ball probe to assure no break in the cortex. We then placed 5.5 x 40 mm pedicle screws into the pedicles bilaterally at L4 and 6.5 x 40 mm screws at L5.  My nurse practitioner assisted in placement of the pedicle screws.  We then decorticated the transverse processes and laid a mixture of morcellized autograft and allograft out over these to perform intertransverse arthrodesis at L3-L5 bilaterally. We then placed lordotic rods into the multiaxial screw heads of the pedicle screws and locked these in position with the locking caps and anti-torque device. We then checked our construct with AP and lateral fluoroscopy. Irrigated with copious amounts of bacitracin-containing saline solution. Inspected the nerve roots once again to assure adequate decompression, lined to the dura with Gelfoam, placed powdered vancomycin into the wound, and then we closed the muscle and the fascia with 0 Vicryl. Closed the subcutaneous tissues with 2-0 Vicryl and subcuticular tissues with 3-0 Vicryl. The skin was closed with benzoin and Steri-Strips. Dressing was then applied, the patient was awakened from general anesthesia and transported to the recovery room in stable condition. At the end of the procedure all sponge, needle and instrument counts were correct.   PLAN OF CARE: admit to inpatient  PATIENT DISPOSITION:  PACU - hemodynamically stable.   Delay start of Pharmacological VTE agent (>24hrs) due to surgical blood loss or risk of bleeding:  yes

## 2020-11-30 NOTE — H&P (Signed)
Subjective: Patient is a 70 y.o. male admitted for back pain. Onset of symptoms was a few months ago, gradually worsening since that time.  The pain is rated severe, unremitting, and is located at the across the lower back and radiates to R thigh. The pain is described as aching and occurs all day. The symptoms have been progressive. Symptoms are exacerbated by exercise and standing. MRI or CT showed DDD/ epidural fibrosis, HNP  Past Medical History:  Diagnosis Date  . Arthritis   . History of kidney stones   . Insomnia   . Pneumonia   . [place]state cancer ([place])   . 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 Wears glasses     Past Surgical History:  Procedure Laterality Date  . ANTERIOR CERVICAL DECOMP/DISCECTOMY FUSION N/A 05/01/2020   Procedure: Cervical Four-Five Anterior Cervical Decompression/Discectomy/Fusion with removal of Cervical Three-Four Hardware;  Surgeon: Eustace Moore, MD;  Location: [place];  Service: Neurosurgery;  Laterality: N/A;  . CARPAL TUNNEL RELEASE     left  . CARPAL TUNNEL RELEASE Right 12/01/2014   Procedure: RIGHT CARPAL TUNNEL RELEASE;  Surgeon: Daryll Brod, MD;  Location: [place];  Service: Orthopedics;  Laterality: Right;  . CATARACT EXTRACTION W/ INTRAOCULAR LENS IMPLANT Right   . CATARACT EXTRACTION W/ INTRAOCULAR LENS IMPLANT Right   . CERVICAL FUSION  2007  . COLONOSCOPY    . COLONOSCOPY WITH PROPOFOL N/A 11/07/2017   Procedure: COLONOSCOPY WITH PROPOFOL;  Surgeon: [name], MD;  Location: [facility] ENDOSCOPY;  Service: Gastroenterology;  Laterality: N/A;  . CYSTOSCOPY MACROPLASTIQUE IMPLANT N/A 09/15/2015   Procedure: CYSTOSCOPY MACROPLASTIQUE IMPLANT;  Surgeon: Royston Cowper, MD;  Location: [facility];  Service: Urology;  Laterality: N/A;  . CYSTOSCOPY/URETEROSCOPY/HOLMIUM LASER/STENT PLACEMENT Left 06/29/2018   Procedure: CYSTOSCOPY/URETEROSCOPY/HOLMIUM LASER/STENT PLACEMENT;  Surgeon: Billey Co, MD;  Location: [facility];   Service: Urology;  Laterality: Left;  . EXTRACORPOREAL SHOCK WAVE LITHOTRIPSY Left 07/12/2018   Procedure: EXTRACORPOREAL SHOCK WAVE LITHOTRIPSY (ESWL);  Surgeon: Billey Co, MD;  Location: [facility];  Service: Urology;  Laterality: Left;  . EXTRACORPOREAL SHOCK WAVE LITHOTRIPSY Right 10/18/2018   Procedure: EXTRACORPOREAL SHOCK WAVE LITHOTRIPSY (ESWL);  Surgeon: Royston Cowper, MD;  Location: [facility];  Service: Urology;  Laterality: Right;  . RETROPUBIC PROSTATECTOMY  2010  . SHOULDER ARTHROSCOPY W/ ROTATOR CUFF REPAIR     right and left  . TRIGGER FINGER RELEASE     x 4  . TRIGGER FINGER RELEASE Left 09/02/2014   Procedure: RELEASE A-1 PULLEY LEFT INDEX FINGER;  Surgeon: Daryll Brod, MD;  Location: [place];  Service: Orthopedics;  Laterality: Left;  ANESTHESIA: IV REGIONAL FAB  . TRIGGER FINGER RELEASE Right 05/16/2017   Procedure: RELEASE TRIGGER FINGER/A-1 PULLEY RIGHT MIDDLE FINGER;  Surgeon: Daryll Brod, MD;  Location: [place];  Service: Orthopedics;  Laterality: Right;  . TRIGGER FINGER RELEASE Right 03/28/2019   Procedure: RELEASE TRIGGER FINGER/A-1 PULLEY RIGHT INDEX;  Surgeon: Daryll Brod, MD;  Location: [place];  Service: Orthopedics;  Laterality: Right;  FAB    Prior to Admission medications   Medication Sig Start Date End Date Taking? Authorizing Provider  aspirin EC 81 MG tablet Take 81 mg by mouth daily with supper.   Yes [provider]  diphenhydrAMINE HCl (ZZZQUIL) 50 MG/30ML LIQD Take 25 mg by mouth at bedtime.   Yes [provider]  diphenhydramine-acetaminophen (TYLENOL PM) 25-500 MG TABS tablet Take  1 tablet by mouth at bedtime.   Yes [provider]  Multiple Vitamins-Minerals (MULTIVITAMIN WITH MINERALS) tablet Take 1 tablet by mouth daily with supper.   Yes [provider]  oxyCODONE (ROXICODONE) 15 MG immediate release tablet Take 1 tablet (15 mg total) by mouth every 6 (six)  hours as needed for moderate pain. 05/01/20 05/01/21 Yes Eustace Moore, MD  triazolam (HALCION) 0.125 MG tablet Take 0.25 mg by mouth at bedtime as needed for sleep.   Yes [provider]  methocarbamol (ROBAXIN) 500 MG tablet Take 1 tablet (500 mg total) by mouth every 6 (six) hours as needed for muscle spasms. 05/01/20   Eustace Moore, MD   No Known Allergies  Social History   Tobacco Use  . Smoking status: Never Smoker  . Smokeless tobacco: Never Used  Substance Use Topics  . Alcohol use: Yes    Alcohol/week: 1.0 standard drink    Types: 1 Glasses of wine per week    Comment: occasionally    Family History  Problem Relation Age of Onset  . Cancer Mother        Brain Tumor  . Kidney failure Father   . Prostate cancer Father   . Heart disease Brother   . Heart attack Maternal Grandfather   . Prostate cancer Maternal Uncle   . Prostate cancer Maternal Uncle      Review of Systems  Positive ROS: neg  All other systems have been reviewed and were otherwise negative with the exception of those mentioned in the HPI and as above.  Objective: Vital signs in last 24 hours: Temp:  [97.5 F (36.4 C)] 97.5 F (36.4 C) (04/25 0901) Pulse Rate:  [104] 104 (04/25 0901) Resp:  [18] 18 (04/25 0901) BP: (150)/(79) 150/79 (04/25 0901) SpO2:  [97 %] 97 % (04/25 0901) Weight:  [80.6 kg] 80.6 kg (04/25 0901)  General Appearance: Alert, cooperative, no distress, appears stated age Head: Normocephalic, without obvious abnormality, atraumatic Eyes: PERRL, conjunctiva/corneas clear, EOM's intact    Neck: Supple, symmetrical, trachea midline Back: Symmetric, no curvature, ROM normal, no CVA tenderness Lungs:  respirations unlabored Heart: Regular rate and rhythm Abdomen: Soft, non-tender Extremities: Extremities normal, atraumatic, no cyanosis or edema Pulses: 2+ and symmetric all extremities Skin: Skin color, texture, turgor normal, no rashes or lesions  NEUROLOGIC:    Mental status: Alert and oriented x4,  no aphasia, good attention span, fund of knowledge, and memory Motor Exam - grossly normal Sensory Exam - grossly normal Reflexes: 1+ Coordination - grossly normal Gait - grossly normal Balance - grossly normal Cranial Nerves: I: smell Not tested  II: visual acuity  OS: nl    OD: nl  II: visual fields Full to confrontation  II: pupils Equal, round, reactive to light  III,VII: ptosis None  III,IV,VI: extraocular muscles  Full ROM  V: mastication Normal  V: facial light touch sensation  Normal  V,VII: corneal reflex  Present  VII: facial muscle function - upper  Normal  VII: facial muscle function - lower Normal  VIII: hearing Not tested  IX: soft palate elevation  Normal  IX,X: gag reflex Present  XI: trapezius strength  5/5  XI: sternocleidomastoid strength 5/5  XI: neck flexion strength  5/5  XII: tongue strength  Normal    Data Review Lab Results  Component Value Date   WBC 8.9 04/29/2020   HGB 16.7 04/29/2020   HCT 50.3 04/29/2020   MCV 94.2 04/29/2020   PLT  293 04/29/2020   Lab Results  Component Value Date   NA 143 10/20/2018   K 3.8 10/20/2018   CL 108 10/20/2018   CO2 25 10/20/2018   BUN 22 10/20/2018   CREATININE 1.35 (H) 10/20/2018   GLUCOSE 107 (H) 10/20/2018   No results found for: INR, PROTIME  Assessment/Plan:  Estimated body mass index is 26.23 kg/m as calculated from the following:   Height as of this encounter: 5\' 9"  (1.753 m).   Weight as of this encounter: 80.6 kg. Patient admitted for PLIF L3-4 L4-5. Patient has failed a reasonable attempt at conservative therapy.  I explained the condition and procedure to the patient and answered any questions.  Patient wishes to proceed with procedure as planned. Understands risks/ benefits and typical outcomes of procedure.   Eustace Moore 11/30/2020 10:39 AM

## 2020-11-30 NOTE — Anesthesia Preprocedure Evaluation (Addendum)
Anesthesia Evaluation  Patient identified by MRN, date of birth, ID band Patient awake    Reviewed: Allergy & Precautions, NPO status , Patient's Chart, lab work & pertinent test results  Airway Mallampati: I  TM Distance: >3 FB Neck ROM: Limited    Dental no notable dental hx.    Pulmonary neg pulmonary ROS,    Pulmonary exam normal breath sounds clear to auscultation       Cardiovascular negative cardio ROS Normal cardiovascular exam Rhythm:Regular Rate:Normal  ECG: rate 100. Normal sinus rhythm Left anterior fascicular block   Neuro/Psych negative psych ROS   GI/Hepatic negative GI ROS, Neg liver ROS,   Endo/Other  negative endocrine ROS  Renal/GU negative Renal ROS     Musculoskeletal  (+) Arthritis ,   Abdominal   Peds  Hematology negative hematology ROS (+)   Anesthesia Other Findings Failed back syndrome  Reproductive/Obstetrics                            Anesthesia Physical Anesthesia Plan  ASA: II  Anesthesia Plan: General   Post-op Pain Management:    Induction: Intravenous  PONV Risk Score and Plan: 2 and Ondansetron, Dexamethasone and Treatment may vary due to age or medical condition  Airway Management Planned: Oral ETT and Video Laryngoscope Planned  Additional Equipment:   Intra-op Plan:   Post-operative Plan: Extubation in OR  Informed Consent: I have reviewed the patients History and Physical, chart, labs and discussed the procedure including the risks, benefits and alternatives for the proposed anesthesia with the patient or authorized representative who has indicated his/her understanding and acceptance.     Dental advisory given  Plan Discussed with: CRNA  Anesthesia Plan Comments:        Anesthesia Quick Evaluation

## 2020-11-30 NOTE — Anesthesia Procedure Notes (Signed)
Procedure Name: Intubation Date/Time: 11/30/2020 11:43 AM Performed by: Dorthea Cove, CRNA Pre-anesthesia Checklist: Patient identified, Emergency Drugs available, Suction available and Patient being monitored Patient Re-evaluated:Patient Re-evaluated prior to induction Oxygen Delivery Method: Circle system utilized Preoxygenation: Pre-oxygenation with 100% oxygen Induction Type: IV induction Ventilation: Mask ventilation without difficulty Laryngoscope Size: Glidescope and 4 Grade View: Grade I Tube type: Oral Tube size: 7.5 mm Number of attempts: 1 Airway Equipment and Method: Stylet and Oral airway Placement Confirmation: ETT inserted through vocal cords under direct vision,  positive ETCO2 and breath sounds checked- equal and bilateral Secured at: 22 cm Tube secured with: Tape Dental Injury: Teeth and Oropharynx as per pre-operative assessment

## 2020-12-01 DIAGNOSIS — M48061 Spinal stenosis, lumbar region without neurogenic claudication: Secondary | ICD-10-CM | POA: Diagnosis not present

## 2020-12-01 DIAGNOSIS — M961 Postlaminectomy syndrome, not elsewhere classified: Secondary | ICD-10-CM | POA: Diagnosis not present

## 2020-12-01 DIAGNOSIS — Z8546 Personal history of malignant neoplasm of prostate: Secondary | ICD-10-CM | POA: Diagnosis not present

## 2020-12-01 DIAGNOSIS — M5116 Intervertebral disc disorders with radiculopathy, lumbar region: Secondary | ICD-10-CM | POA: Diagnosis not present

## 2020-12-01 DIAGNOSIS — Z7982 Long term (current) use of aspirin: Secondary | ICD-10-CM | POA: Diagnosis not present

## 2020-12-01 MED ORDER — OXYCODONE HCL 15 MG PO TABS: 15.0000 mg | ORAL_TABLET | Freq: Four times a day (QID) | ORAL | 0 refills | Status: AC | PRN

## 2020-12-01 MED ORDER — METHOCARBAMOL 500 MG PO TABS: 500.0000 mg | ORAL_TABLET | Freq: Four times a day (QID) | ORAL | 0 refills | Status: DC | PRN

## 2020-12-01 MED ORDER — TAMSULOSIN HCL 0.4 MG PO CAPS
0.4000 mg | ORAL_CAPSULE | Freq: Every day | ORAL | Status: DC
  Administered 2020-12-01: 0.4 mg via ORAL
  Filled 2020-12-01: qty 1

## 2020-12-01 MED FILL — Thrombin (Recombinant) For Soln 20000 Unit: CUTANEOUS | Qty: 1 | Status: AC

## 2020-12-01 NOTE — Evaluation (Signed)
Physical Therapy Evaluation and Discharge Patient Details Name: Elijah Taylor MRN: [id number] DOB: 11/16/1950 Today's Date: 12/01/2020   History of Present Illness  70 yo male s/p PLIF L4-5 and TLIF L3-4. PMH including arthrtis, urinary bladder incontinence related to prostectomy, ACDF (2021), and shoulder cuff repair (2010)    Clinical Impression  Patient evaluated by Physical Therapy with no further acute PT needs identified. All education has been completed and the patient has no further questions. Pt was able to demonstrate transfers and ambulation with gross modified independence and no AD. Pt was educated on precautions, brace application/wearing schedule, appropriate activity progression, and car transfer. See below for any follow-up Physical Therapy or equipment needs. PT is signing off. Thank you for this referral.     Follow Up Recommendations No PT follow up;Supervision for mobility/OOB    Equipment Recommendations  None recommended by PT    Recommendations for Other Services       Precautions / Restrictions Precautions Precautions: Back Precaution Booklet Issued: Yes (comment) Precaution Comments: Reviewed back precautions and compensatory techniques for ADLs Required Braces or Orthoses: Spinal Brace Spinal Brace: Lumbar corset;Applied in sitting position Restrictions Weight Bearing Restrictions: No      Mobility  Bed Mobility Overal bed mobility: Modified Independent Bed Mobility: Rolling;Sidelying to Sit           General bed mobility comments: Log roll with increased time. HOB flat and rails lowered to simulate home environment.    Transfers Overall transfer level: Modified independent Equipment used: None Transfers: Sit to/from Stand Sit to Stand: Supervision         General transfer comment: No assist required and no unsteadiness noted.  Ambulation/Gait Ambulation/Gait assistance: Modified independent (Device/Increase time) Gait Distance  (Feet): 400 Feet Assistive device: None Gait Pattern/deviations: Step-through pattern;Decreased stride length Gait velocity: Decreased mildly Gait velocity interpretation: 1.31 - 2.62 ft/sec, indicative of limited community ambulator General Gait Details: Mildly slowed gait speed with good overall posture.  Stairs Stairs: Yes Stairs assistance: Supervision Stair Management: One rail Right;Step to pattern;Forwards Number of Stairs: 10 General stair comments: VC's for sequencing and general safety.  Wheelchair Mobility    Modified Rankin (Stroke Patients Only)       Balance Overall balance assessment: Mild deficits observed, not formally tested                                           Pertinent Vitals/Pain Pain Assessment: Faces Faces Pain Scale: Hurts a little bit Pain Location: Back Pain Descriptors / Indicators: Grimacing;Operative site guarding Pain Intervention(s): Limited activity within patient's tolerance;Monitored during session;Repositioned    Home Living Family/patient expects to be discharged to:: Private residence Living Arrangements: Spouse/significant other Available Help at Discharge: Family;Available 24 hours/day Type of Home: House Home Access: Stairs to enter Entrance Stairs-Rails: Right Entrance Stairs-Number of Steps: 3   Home Equipment: None      Prior Function Level of Independence: Independent               Hand Dominance        Extremity/Trunk Assessment   Upper Extremity Assessment Upper Extremity Assessment: Defer to OT evaluation    Lower Extremity Assessment Lower Extremity Assessment: LLE deficits/detail LLE Deficits / Details: Guarding LE due to pain in back.    Cervical / Trunk Assessment Cervical / Trunk Assessment: Other exceptions Cervical / Trunk Exceptions: s/p back  sx  Communication   Communication: No difficulties  Cognition Arousal/Alertness: Awake/alert Behavior During Therapy: WFL for  tasks assessed/performed Overall Cognitive Status: Within Functional Limits for tasks assessed                                        General Comments General comments (skin integrity, edema, etc.): Wife arriving at end of session    Exercises     Assessment/Plan    PT Assessment Patent does not need any further PT services  PT Problem List         PT Treatment Interventions      PT Goals (Current goals can be found in the Care Plan section)  Acute Rehab PT Goals Patient Stated Goal: Go home today PT Goal Formulation: All assessment and education complete, DC therapy    Frequency     Barriers to discharge        Co-evaluation               AM-PAC PT "6 Clicks" Mobility  Outcome Measure Help needed turning from your back to your side while in a flat bed without using bedrails?: None Help needed moving from lying on your back to sitting on the side of a flat bed without using bedrails?: None Help needed moving to and from a bed to a chair (including a wheelchair)?: None Help needed standing up from a chair using your arms (e.g., wheelchair or bedside chair)?: None Help needed to walk in hospital room?: None Help needed climbing 3-5 steps with a railing? : A Little 6 Click Score: 23    End of Session Equipment Utilized During Treatment: Back brace Activity Tolerance: Patient tolerated treatment well Patient left: in bed;with call bell/phone within reach;with family/visitor present Nurse Communication: Mobility status PT Visit Diagnosis: Unsteadiness on feet (R26.81);Pain Pain - part of body:  (back)    Time: 4540-9811 PT Time Calculation (min) (ACUTE ONLY): 12 min   Charges:   PT Evaluation $PT Eval Low Complexity: 1 Low          Elijah Taylor, PT, DPT Acute Rehabilitation Services Pager: [phone] Office: [phone]   Elijah Taylor 12/01/2020, 10:09 AM

## 2020-12-01 NOTE — Care Management Obs Status (Signed)
[place] NOTIFICATION   Patient Details  Name: Elijah Taylor MRN: [id number] Date of Birth: Feb 16, 1951   Medicare Observation Status Notification Given:  Yes    Joanne Chars, LCSW 12/01/2020, 8:54 AM

## 2020-12-01 NOTE — Progress Notes (Signed)
Patient alert and oriented, voiding adequately, skin clean, dry and intact without evidence of skin break down, or symptoms of complications - no redness, only slight tenderness at site.  Patient states pain is manageable at time of discharge. Patient has an appointment with Dr. Ronnald Ramp in 2 weeks

## 2020-12-01 NOTE — Evaluation (Signed)
Occupational Therapy Evaluation Patient Details Name: Elijah Taylor MRN: [id number] DOB: 1951/07/31 Today's Date: 12/01/2020    History of Present Illness 70 yo male s/p PLIF L4-5 and TLIF L3-4. PMH including arthrtis, urinary bladder incontinence related to prostectomy, ACDF (2021), and shoulder cuff repair (2010)   Clinical Impression   PTA, pt was living with his wife and was independent. Currently, pt requires Mod I-Supervision for ADLs and functional mobility. Provided education and handout on back precautions, brace management, bed mobility, grooming, LB ADLs, toileting, and tub transfer; pt demonstrated understanding. Answered all pt questions. Recommend dc home once medically stable per physician. All acute OT needs met and will sign off. Thank you.    Follow Up Recommendations  No OT follow up    Equipment Recommendations  None recommended by OT    Recommendations for Other Services       Precautions / Restrictions Precautions Precautions: Back Precaution Booklet Issued: Yes (comment) Precaution Comments: Reviewed back precautions and compensatory techniques for ADLs Required Braces or Orthoses: Spinal Brace Spinal Brace: Lumbar corset;Applied in sitting position      Mobility Bed Mobility Overal bed mobility: Modified Independent Bed Mobility: Rolling;Sidelying to Sit           General bed mobility comments: Log roll with increased time    Transfers Overall transfer level: Needs assistance   Transfers: Sit to/from Stand Sit to Stand: Supervision         General transfer comment: Supervision for initial safety    Balance Overall balance assessment: Mild deficits observed, not formally tested                                         ADL either performed or assessed with clinical judgement   ADL Overall ADL's : Needs assistance/impaired                                       General ADL Comments: Pt performing  ADLs and functional mobility at Supervision level. Providing education and handout on back precautions, bed mobility, brace management, grooming, LB ADLs, toileting, and tub transfer.     Vision Baseline Vision/History: Wears glasses Patient Visual Report: No change from baseline       Perception     Praxis      Pertinent Vitals/Pain Pain Assessment: Faces Faces Pain Scale: Hurts little more Pain Location: Back Pain Descriptors / Indicators: Discomfort;Grimacing Pain Intervention(s): Monitored during session     Hand Dominance     Extremity/Trunk Assessment Upper Extremity Assessment Upper Extremity Assessment: Overall WFL for tasks assessed   Lower Extremity Assessment Lower Extremity Assessment: Defer to PT evaluation   Cervical / Trunk Assessment Cervical / Trunk Assessment: Other exceptions Cervical / Trunk Exceptions: s/p back sx   Communication Communication Communication: No difficulties   Cognition Arousal/Alertness: Awake/alert Behavior During Therapy: WFL for tasks assessed/performed Overall Cognitive Status: Within Functional Limits for tasks assessed                                     General Comments  Wife arriving at end of session    Exercises     Shoulder Instructions      Home Living Family/patient expects to be  discharged to:: Private residence Living Arrangements: Spouse/significant other Available Help at Discharge: Family;Available 24 hours/day Type of Home: House Home Access: Stairs to enter [employer] of Steps: 3         Bathroom Shower/Tub: Teacher, early years/pre: Standard     Home Equipment: None          Prior Functioning/Environment Level of Independence: Independent                 OT Problem List: Decreased activity tolerance;Impaired balance (sitting and/or standing);Decreased knowledge of use of DME or AE;Decreased knowledge of precautions;Pain      OT  Treatment/Interventions:      OT Goals(Current goals can be found in the care plan section) Acute Rehab OT Goals Patient Stated Goal: Go home today OT Goal Formulation: All assessment and education complete, DC therapy  OT Frequency:     Barriers to D/C:            Co-evaluation              AM-PAC OT "6 Clicks" Daily Activity     Outcome Measure Help from another person eating meals?: None Help from another person taking care of personal grooming?: None Help from another person toileting, which includes using toliet, bedpan, or urinal?: A Little Help from another person bathing (including washing, rinsing, drying)?: A Little Help from another person to put on and taking off regular upper body clothing?: None Help from another person to put on and taking off regular lower body clothing?: A Little 6 Click Score: 21   End of Session Equipment Utilized During Treatment: Back brace Nurse Communication: Mobility status  Activity Tolerance: Patient tolerated treatment well Patient left: in bed;with call bell/phone within reach;with family/visitor present (Sitting at EOB)  OT Visit Diagnosis: Unsteadiness on feet (R26.81);Other abnormalities of gait and mobility (R26.89);Muscle weakness (generalized) (M62.81);Pain Pain - part of body:  (Back)                Time: 6759-1638 OT Time Calculation (min): 17 min Charges:  OT General Charges $OT Visit: 1 Visit OT Evaluation $OT Eval Low Complexity: [facility], OTR/L Acute Rehab Pager: [phone] Office: [place] 12/01/2020, 8:46 AM

## 2020-12-01 NOTE — Discharge Summary (Signed)
Physician Discharge Summary  Patient ID: Elijah Taylor MRN: [id number] DOB/AGE: 05/02/1951 70 y.o.  Admit date: 11/30/2020 Discharge date: 12/01/2020  Admission Diagnoses:  Failed back syndrome, degenerative disc disease, back pain, radiculopathy, spinal stenosis    Discharge Diagnoses: same   Discharged Condition: good  Hospital Course: The patient was admitted on 11/30/2020 and taken to the operating room where the patient underwent PLIF L4-5, TLIF L3-4. The patient tolerated the procedure well and was taken to the recovery room and then to the floor in stable condition. The hospital course was routine. There were no complications. The wound remained clean dry and intact. Pt had appropriate back soreness. No complaints of leg pain or new N/T/W. The patient remained afebrile with stable vital signs, and tolerated a regular diet. The patient continued to increase activities, and pain was well controlled with oral pain medications.   Consults: None  Significant Diagnostic Studies:  Results for orders placed or performed during the hospital encounter of 38/25/05  Basic metabolic panel  Result Value Ref Range   Sodium 139 135 - 145 mmol/L   Potassium 4.3 3.5 - 5.1 mmol/L   Chloride 109 98 - 111 mmol/L   CO2 22 22 - 32 mmol/L   Glucose, Bld 90 70 - 99 mg/dL   BUN 17 8 - 23 mg/dL   Creatinine, Ser 1.13 0.61 - 1.24 mg/dL   Calcium 9.1 8.9 - 10.3 mg/dL   GFR, Estimated >60 >60 mL/min   Anion gap 8 5 - 15  CBC WITH DIFFERENTIAL  Result Value Ref Range   WBC 12.9 (H) 4.0 - 10.5 K/uL   RBC 5.01 4.22 - 5.81 MIL/uL   Hemoglobin 15.6 13.0 - 17.0 g/dL   HCT 47.6 39.0 - 52.0 %   MCV 95.0 80.0 - 100.0 fL   MCH 31.1 26.0 - 34.0 pg   MCHC 32.8 30.0 - 36.0 g/dL   RDW 12.4 11.5 - 15.5 %   Platelets 335 150 - 400 K/uL   nRBC 0.0 0.0 - 0.2 %   Neutrophils Relative % 63 %   Neutro Abs 8.3 (H) 1.7 - 7.7 K/uL   Lymphocytes Relative 23 %   Lymphs Abs 2.9 0.7 - 4.0 K/uL   Monocytes Relative  11 %   Monocytes Absolute 1.4 (H) 0.1 - 1.0 K/uL   Eosinophils Relative 1 %   Eosinophils Absolute 0.1 0.0 - 0.5 K/uL   Basophils Relative 1 %   Basophils Absolute 0.1 0.0 - 0.1 K/uL   Immature Granulocytes 1 %   Abs Immature Granulocytes 0.09 (H) 0.00 - 0.07 K/uL  Protime-INR  Result Value Ref Range   Prothrombin Time 14.4 11.4 - 15.2 seconds   INR 1.1 0.8 - 1.2  Type and screen [place]  Result Value Ref Range   ABO/RH(D) A POS    Antibody Screen NEG    Sample Expiration      12/03/2020,2359 Performed at [facility], [address], [place], [place] [address]     Chest 2 View  Result Date: 11/30/2020 CLINICAL DATA:  Pneumonia, preop. EXAM: CHEST - 2 VIEW COMPARISON:  12/12/2005. FINDINGS: Trachea is midline. Heart size normal. Lungs are Taylor. No pleural fluid. IMPRESSION: Negative. Electronically Signed   By: Lorin Picket M.D.   On: 11/30/2020 11:07   DG Lumbar Spine 2-3 Views  Result Date: 11/30/2020 CLINICAL DATA:  L3-L4, L4-L5 PLIF. EXAM: DG C-ARM 1-60 MIN; LUMBAR SPINE - 2-3 VIEW FLUOROSCOPY TIME:  Fluoroscopy Time:  1 minutes and 18 seconds. Radiation Exposure Index (if provided by the fluoroscopic device): 22.52 mGy. Number of Acquired Spot Images: 2 COMPARISON:  MRI October 06, 2020. FINDINGS: Two C-arm fluoroscopic images were obtained intraoperatively and submitted for post operative interpretation. These images demonstrate bilateral pedicle screws at L3, L4, and L5. Intervening rods bilaterally with spacers at L3-L4 and L4-L5. Please see the performing provider's procedural report for further detail. IMPRESSION: L3-L5 PLIF. Electronically Signed   By: Margaretha Sheffield MD   On: 11/30/2020 15:12   DG C-Arm 1-60 Min  Result Date: 11/30/2020 CLINICAL DATA:  L3-L4, L4-L5 PLIF. EXAM: DG C-ARM 1-60 MIN; LUMBAR SPINE - 2-3 VIEW FLUOROSCOPY TIME:  Fluoroscopy Time:  1 minutes and 18 seconds. Radiation Exposure Index (if provided by the fluoroscopic  device): 22.52 mGy. Number of Acquired Spot Images: 2 COMPARISON:  MRI October 06, 2020. FINDINGS: Two C-arm fluoroscopic images were obtained intraoperatively and submitted for post operative interpretation. These images demonstrate bilateral pedicle screws at L3, L4, and L5. Intervening rods bilaterally with spacers at L3-L4 and L4-L5. Please see the performing provider's procedural report for further detail. IMPRESSION: L3-L5 PLIF. Electronically Signed   By: Margaretha Sheffield MD   On: 11/30/2020 15:12    Antibiotics:  Anti-infectives (From admission, onward)   Start     Dose/Rate Route Frequency Ordered Stop   11/30/20 2000  ceFAZolin (ANCEF) IVPB 2g/100 mL premix        2 g 200 mL/hr over 30 Minutes Intravenous Every 8 hours 11/30/20 1653 12/01/20 0339   11/30/20 0945  ceFAZolin (ANCEF) IVPB 2g/100 mL premix        2 g 200 mL/hr over 30 Minutes Intravenous On call to O.R. 11/30/20 0934 11/30/20 1153      Discharge Exam: Blood pressure 115/70, pulse (!) 103, temperature 98.1 F (36.7 C), temperature source Oral, resp. rate 17, height 5\' 9"  (1.753 m), weight 80.6 kg, SpO2 95 %. Neurologic: Grossly normal Ambulating and voiding well, incision cdi  Discharge Medications:   Allergies as of 12/01/2020   No Known Allergies     Medication List    TAKE these medications   aspirin EC 81 MG tablet Take 81 mg by mouth daily with supper.   diphenhydramine-acetaminophen 25-500 MG Tabs tablet Commonly known as: TYLENOL PM Take 1 tablet by mouth at bedtime.   methocarbamol 500 MG tablet Commonly known as: ROBAXIN Take 1 tablet (500 mg total) by mouth every 6 (six) hours as needed for muscle spasms.   multivitamin with minerals tablet Take 1 tablet by mouth daily with supper.   oxyCODONE 15 MG immediate release tablet Commonly known as: Roxicodone Take 1 tablet (15 mg total) by mouth every 6 (six) hours as needed. What changed: reasons to take this   triazolam 0.125 MG  tablet Commonly known as: HALCION Take 0.25 mg by mouth at bedtime as needed for sleep.   ZzzQuil 50 MG/30ML Liqd Generic drug: diphenhydrAMINE HCl Take 25 mg by mouth at bedtime.            Durable Medical Equipment  (From admission, onward)         Start     Ordered   11/30/20 1654  DME Walker rolling  Once       Question:  Patient needs a walker to treat with the following condition  Answer:  S/P lumbar fusion   11/30/20 1653   11/30/20 1654  DME 3 n 1  Once  11/30/20 1653          Disposition: home   Final Dx: PLIF L4-5, TLIF L3-4  Discharge Instructions     Remove dressing in 72 hours   Complete by: As directed    Call MD for:  difficulty breathing, headache or visual disturbances   Complete by: As directed    Call MD for:  hives   Complete by: As directed    Call MD for:  persistant nausea and vomiting   Complete by: As directed    Call MD for:  redness, tenderness, or signs of infection (pain, swelling, redness, odor or green/yellow discharge around incision site)   Complete by: As directed    Call MD for:  severe uncontrolled pain   Complete by: As directed    Call MD for:  temperature >100.4   Complete by: As directed    Diet - low sodium heart healthy   Complete by: As directed    Driving Restrictions   Complete by: As directed    No driving for 2 weeks, no riding in the car for 1 week   Increase activity slowly   Complete by: As directed    Lifting restrictions   Complete by: As directed    No lifting more than 8 lbs         Signed: Ocie Cornfield Jsean Taussig 12/01/2020, 8:09 AM

## 2020-12-01 NOTE — Anesthesia Postprocedure Evaluation (Signed)
Anesthesia Post Note  Patient: Darrick Penna Punches  Procedure(s) Performed: PLIF - L3-L4 - L4-L5 - (N/A Back)     Patient location during evaluation: PACU Anesthesia Type: General Level of consciousness: awake Pain management: pain level controlled Vital Signs Assessment: post-procedure vital signs reviewed and stable Respiratory status: spontaneous breathing, nonlabored ventilation, respiratory function stable and patient connected to nasal cannula oxygen Cardiovascular status: blood pressure returned to baseline and stable Postop Assessment: no apparent nausea or vomiting Anesthetic complications: no   No complications documented.  Last Vitals:  Vitals:   12/01/20 0330 12/01/20 0718  BP: 101/63 115/70  Pulse: 86 (!) 103  Resp: 18 17  Temp: 36.6 C 36.7 C  SpO2: 96% 95%    Last Pain:  Vitals:   12/01/20 0718  TempSrc: Oral  PainSc:                  Karyl Kinnier Koehn Salehi

## 2020-12-01 NOTE — Care Management CC44 (Signed)
Condition Code 44 Documentation Completed  Patient Details  Name: SIMRAN MANNIS MRN: [id number] Date of Birth: Feb 09, 1951   Condition Code 44 given:  Yes Patient signature on Condition Code 44 notice:  Yes Documentation of 2 MD's agreement:  Yes Code 44 added to claim:  Yes    Joanne Chars, LCSW 12/01/2020, 8:54 AM

## 2020-12-29 DIAGNOSIS — M961 Postlaminectomy syndrome, not elsewhere classified: Secondary | ICD-10-CM | POA: Diagnosis not present

## 2021-03-02 DIAGNOSIS — R030 Elevated blood-pressure reading, without diagnosis of hypertension: Secondary | ICD-10-CM | POA: Diagnosis not present

## 2021-03-02 DIAGNOSIS — M961 Postlaminectomy syndrome, not elsewhere classified: Secondary | ICD-10-CM | POA: Diagnosis not present

## 2021-03-02 DIAGNOSIS — Z6827 Body mass index (BMI) 27.0-27.9, adult: Secondary | ICD-10-CM | POA: Diagnosis not present

## 2021-03-02 DIAGNOSIS — M5459 Other low back pain: Secondary | ICD-10-CM | POA: Diagnosis not present

## 2021-06-01 DIAGNOSIS — Z6827 Body mass index (BMI) 27.0-27.9, adult: Secondary | ICD-10-CM | POA: Diagnosis not present

## 2021-06-01 DIAGNOSIS — M961 Postlaminectomy syndrome, not elsewhere classified: Secondary | ICD-10-CM | POA: Diagnosis not present

## 2021-06-01 DIAGNOSIS — R030 Elevated blood-pressure reading, without diagnosis of hypertension: Secondary | ICD-10-CM | POA: Diagnosis not present

## 2021-06-15 DIAGNOSIS — M961 Postlaminectomy syndrome, not elsewhere classified: Secondary | ICD-10-CM | POA: Diagnosis not present

## 2021-06-15 DIAGNOSIS — M5126 Other intervertebral disc displacement, lumbar region: Secondary | ICD-10-CM | POA: Diagnosis not present

## 2021-06-17 DIAGNOSIS — M5459 Other low back pain: Secondary | ICD-10-CM | POA: Diagnosis not present

## 2021-06-17 DIAGNOSIS — M961 Postlaminectomy syndrome, not elsewhere classified: Secondary | ICD-10-CM | POA: Diagnosis not present

## 2021-09-16 DIAGNOSIS — M961 Postlaminectomy syndrome, not elsewhere classified: Secondary | ICD-10-CM | POA: Diagnosis not present

## 2021-09-16 DIAGNOSIS — M545 Low back pain, unspecified: Secondary | ICD-10-CM | POA: Diagnosis not present

## 2021-09-18 IMAGING — CR DG LUMBAR SPINE COMPLETE 4+V
1 series · 5 of 5 positions shown · non-contrast
Comparison: Prior radiograph from 04/08/2015.

CLINICAL DATA: Initial evaluation for acute on chronic lower back
pain, with severe left lower back pain radiating to the left knee.

EXAM:
LUMBAR SPINE - COMPLETE 4+ VIEW

[Series 1: dg lumbar spine complete 4 +v · 0.14mm/px · 5 of 5 slices shown]
[im 1/5]
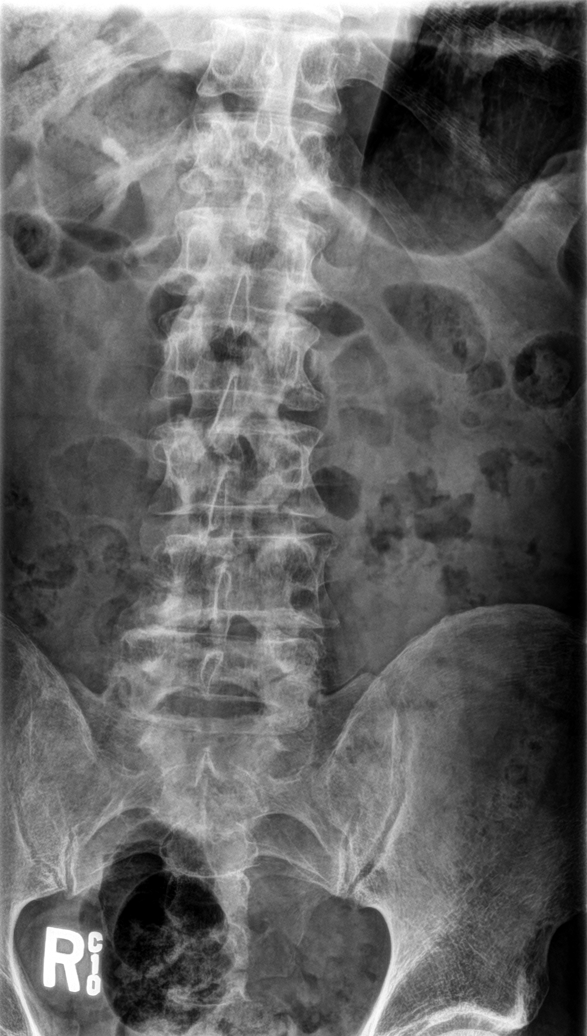
[im 2/5]
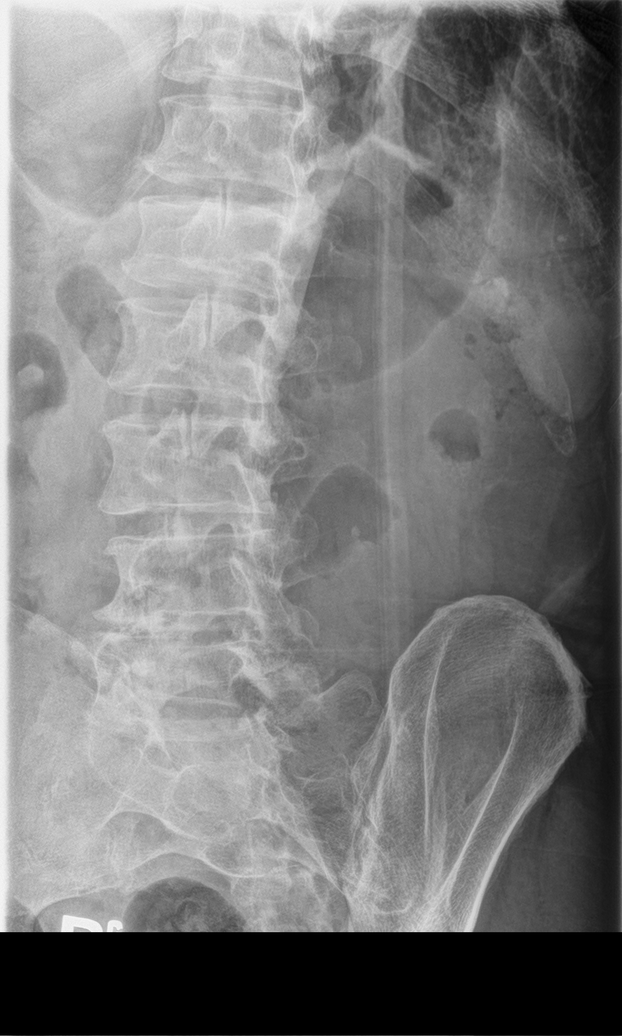
[im 3/5]
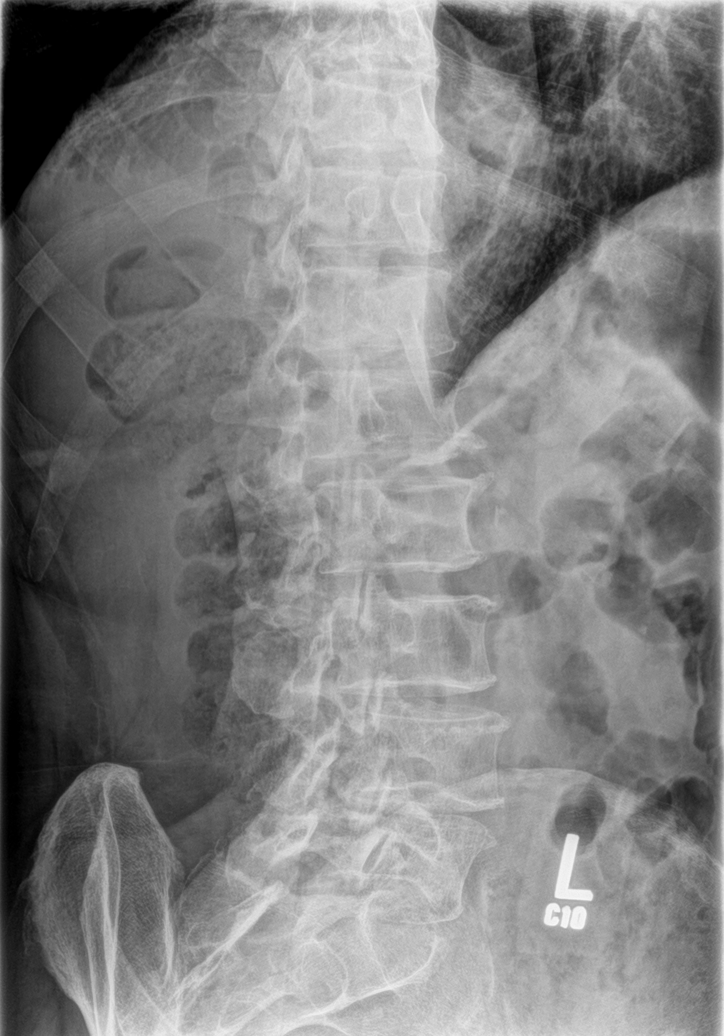
[im 4/5]
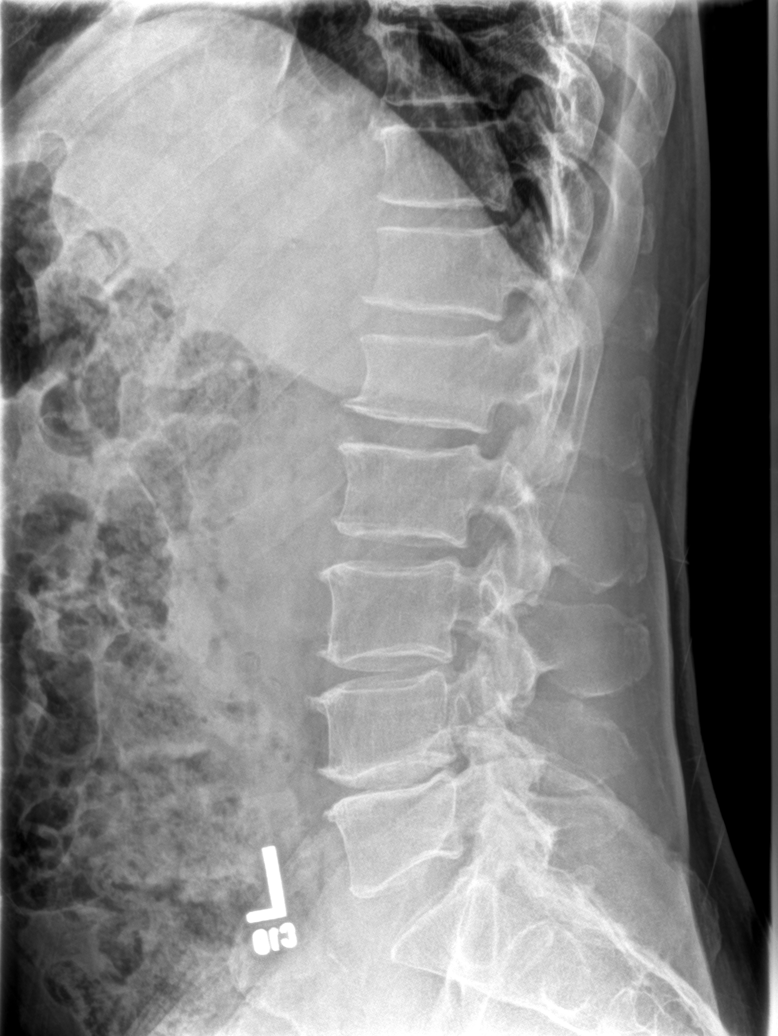
[im 5/5]
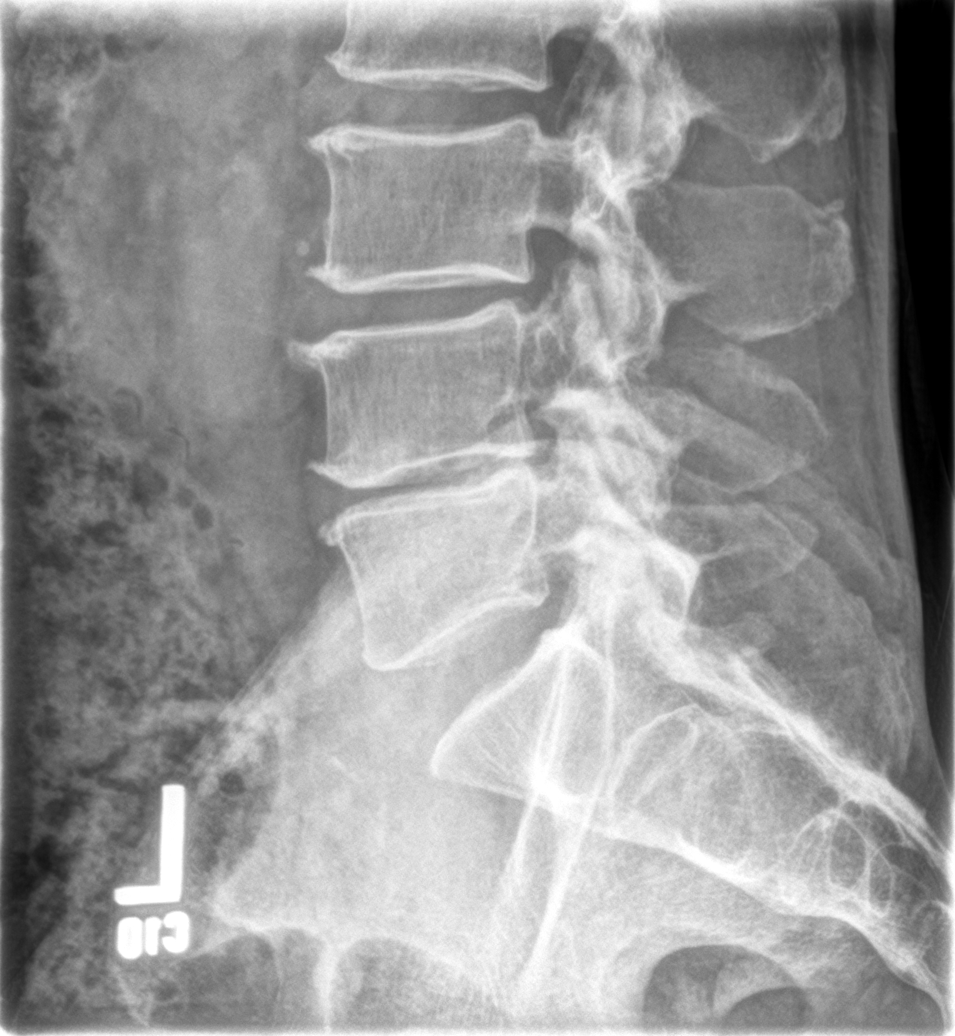

[5 of 5 positions shown; findings below may reference images not displayed]

FINDINGS: Five non rib-bearing lumbar type vertebral bodies. Vertebral bodies
normally aligned with preservation of the normal lumbar lordosis. No
listhesis or subluxation. Vertebral body height maintained without
evidence for acute or chronic fracture. Visualized sacrum and pelvis
intact.

Moderate degenerative intervertebral disc space narrowing present at
L4-5. Additional more mild disc space narrowing at L1-2 through
L3-4. Multilevel endplate spurring noted throughout the lumbar
spine. Bilateral facet hypertrophy present at L3-4 through L5-S1.

Visualized soft tissues demonstrate no acute abnormality.
IMPRESSION: 1. No radiographic evidence for acute abnormality within the lumbar
spine.
2. Mild-to-moderate multilevel degenerative disc disease, most
prominent at L4-5.
3. Bilateral facet arthrosis at L3-4 through L5-S1.

## 2021-10-11 DIAGNOSIS — Z20822 Contact with and (suspected) exposure to covid-19: Secondary | ICD-10-CM | POA: Diagnosis not present

## 2021-10-11 DIAGNOSIS — J189 Pneumonia, unspecified organism: Secondary | ICD-10-CM | POA: Diagnosis not present

## 2021-11-23 DIAGNOSIS — N393 Stress incontinence (female) (male): Secondary | ICD-10-CM | POA: Diagnosis not present

## 2021-11-23 DIAGNOSIS — D400 Neoplasm of uncertain behavior of prostate: Secondary | ICD-10-CM | POA: Diagnosis not present

## 2021-11-23 DIAGNOSIS — F5109 Other insomnia not due to a substance or known physiological condition: Secondary | ICD-10-CM | POA: Diagnosis not present

## 2021-11-23 DIAGNOSIS — C61 Malignant neoplasm of prostate: Secondary | ICD-10-CM | POA: Diagnosis not present

## 2021-11-23 DIAGNOSIS — R351 Nocturia: Secondary | ICD-10-CM | POA: Diagnosis not present

## 2021-12-08 DIAGNOSIS — G5603 Carpal tunnel syndrome, bilateral upper limbs: Secondary | ICD-10-CM | POA: Diagnosis not present

## 2021-12-22 DIAGNOSIS — G5613 Other lesions of median nerve, bilateral upper limbs: Secondary | ICD-10-CM | POA: Diagnosis not present

## 2021-12-29 ENCOUNTER — Other Ambulatory Visit: Payer: Self-pay | Admitting: Orthopedic Surgery

## 2021-12-29 DIAGNOSIS — G5603 Carpal tunnel syndrome, bilateral upper limbs: Secondary | ICD-10-CM | POA: Diagnosis not present

## 2021-12-31 ENCOUNTER — Other Ambulatory Visit: Payer: Self-pay

## 2021-12-31 ENCOUNTER — Encounter (HOSPITAL_BASED_OUTPATIENT_CLINIC_OR_DEPARTMENT_OTHER): Payer: Self-pay | Admitting: Orthopedic Surgery

## 2022-01-11 ENCOUNTER — Encounter (HOSPITAL_BASED_OUTPATIENT_CLINIC_OR_DEPARTMENT_OTHER)
Admission: RE | Disposition: A | Discharge status: Home / Self Care | Payer: Self-pay | Source: Ambulatory Visit | Attending: Orthopedic Surgery

## 2022-01-11 ENCOUNTER — Encounter (HOSPITAL_BASED_OUTPATIENT_CLINIC_OR_DEPARTMENT_OTHER): Payer: Self-pay | Admitting: Orthopedic Surgery

## 2022-01-11 ENCOUNTER — Other Ambulatory Visit: Payer: Self-pay

## 2022-01-11 ENCOUNTER — Ambulatory Visit (HOSPITAL_BASED_OUTPATIENT_CLINIC_OR_DEPARTMENT_OTHER): Payer: Medicare Other | Admitting: Certified Registered"

## 2022-01-11 ENCOUNTER — Ambulatory Visit (HOSPITAL_BASED_OUTPATIENT_CLINIC_OR_DEPARTMENT_OTHER)
Admission: RE | Admit: 2022-01-11 | Discharge: 2022-01-11 | Disposition: A | Discharge status: Home / Self Care | Payer: Medicare Other | Source: Ambulatory Visit | Attending: Orthopedic Surgery | Admitting: Orthopedic Surgery

## 2022-01-11 DIAGNOSIS — G5602 Carpal tunnel syndrome, left upper limb: Secondary | ICD-10-CM | POA: Insufficient documentation

## 2022-01-11 DIAGNOSIS — Z01818 Encounter for other preprocedural examination: Secondary | ICD-10-CM

## 2022-01-11 HISTORY — PX: CARPAL TUNNEL RELEASE: SHX101

## 2022-01-11 SURGERY — CARPAL TUNNEL RELEASE
Anesthesia: Regional | Site: Wrist | Laterality: Left

## 2022-01-11 MED ORDER — LACTATED RINGERS IV SOLN: INTRAVENOUS | Status: DC

## 2022-01-11 MED ORDER — CEFAZOLIN SODIUM-DEXTROSE 2-4 GM/100ML-% IV SOLN
2.0000 g | INTRAVENOUS | Status: AC
  Administered 2022-01-11: 2 g via INTRAVENOUS

## 2022-01-11 MED ORDER — ONDANSETRON HCL 4 MG/2ML IJ SOLN
INTRAMUSCULAR | Status: DC | PRN
  Administered 2022-01-11: 4 mg via INTRAVENOUS

## 2022-01-11 MED ORDER — ACETAMINOPHEN 500 MG PO TABS
1000.0000 mg | ORAL_TABLET | Freq: Once | ORAL | Status: AC
  Administered 2022-01-11: 1000 mg via ORAL

## 2022-01-11 MED ORDER — FENTANYL CITRATE (PF) 100 MCG/2ML IJ SOLN: 25.0000 ug | INTRAMUSCULAR | Status: DC | PRN

## 2022-01-11 MED ORDER — HYDROCODONE-ACETAMINOPHEN 5-325 MG PO TABS: ORAL_TABLET | ORAL | 0 refills | Status: DC

## 2022-01-11 MED ORDER — LIDOCAINE HCL (CARDIAC) PF 100 MG/5ML IV SOSY
PREFILLED_SYRINGE | INTRAVENOUS | Status: DC | PRN
  Administered 2022-01-11: 20 mg via INTRAVENOUS

## 2022-01-11 MED ORDER — OXYCODONE HCL 5 MG/5ML PO SOLN: 5.0000 mg | Freq: Once | ORAL | Status: DC | PRN

## 2022-01-11 MED ORDER — OXYCODONE HCL 5 MG PO TABS: 5.0000 mg | ORAL_TABLET | Freq: Once | ORAL | Status: DC | PRN

## 2022-01-11 MED ORDER — FENTANYL CITRATE (PF) 100 MCG/2ML IJ SOLN
INTRAMUSCULAR | Status: DC | PRN
  Administered 2022-01-11: 25 ug via INTRAVENOUS
  Administered 2022-01-11: 50 ug via INTRAVENOUS
  Administered 2022-01-11: 25 ug via INTRAVENOUS

## 2022-01-11 MED ORDER — 0.9 % SODIUM CHLORIDE (POUR BTL) OPTIME
TOPICAL | Status: DC | PRN
  Administered 2022-01-11: 1000 mL

## 2022-01-11 MED ORDER — CEFAZOLIN SODIUM-DEXTROSE 2-4 GM/100ML-% IV SOLN
INTRAVENOUS | Status: AC
  Filled 2022-01-11: qty 100

## 2022-01-11 MED ORDER — CELECOXIB 200 MG PO CAPS
ORAL_CAPSULE | ORAL | Status: AC
  Filled 2022-01-11: qty 1

## 2022-01-11 MED ORDER — LIDOCAINE 2% (20 MG/ML) 5 ML SYRINGE
INTRAMUSCULAR | Status: AC
  Filled 2022-01-11: qty 5

## 2022-01-11 MED ORDER — FENTANYL CITRATE (PF) 100 MCG/2ML IJ SOLN
INTRAMUSCULAR | Status: AC
  Filled 2022-01-11: qty 2

## 2022-01-11 MED ORDER — PROPOFOL 500 MG/50ML IV EMUL
INTRAVENOUS | Status: DC | PRN
  Administered 2022-01-11: 150 ug/kg/min via INTRAVENOUS

## 2022-01-11 MED ORDER — LIDOCAINE HCL (PF) 0.5 % IJ SOLN
INTRAMUSCULAR | Status: DC | PRN
  Administered 2022-01-11: 30 mL via INTRAVENOUS

## 2022-01-11 MED ORDER — DROPERIDOL 2.5 MG/ML IJ SOLN: 0.6250 mg | Freq: Once | INTRAMUSCULAR | Status: DC | PRN

## 2022-01-11 MED ORDER — BUPIVACAINE HCL (PF) 0.25 % IJ SOLN
INTRAMUSCULAR | Status: DC | PRN
  Administered 2022-01-11: 8 mL

## 2022-01-11 MED ORDER — PROPOFOL 10 MG/ML IV BOLUS
INTRAVENOUS | Status: DC | PRN
  Administered 2022-01-11: 20 mg via INTRAVENOUS

## 2022-01-11 MED ORDER — ACETAMINOPHEN 500 MG PO TABS
ORAL_TABLET | ORAL | Status: AC
  Filled 2022-01-11: qty 2

## 2022-01-11 MED ORDER — CELECOXIB 200 MG PO CAPS
200.0000 mg | ORAL_CAPSULE | Freq: Once | ORAL | Status: AC
  Administered 2022-01-11: 200 mg via ORAL

## 2022-01-11 SURGICAL SUPPLY — 35 items
APL PRP STRL LF DISP 70% ISPRP (MISCELLANEOUS) ×1
BLADE SURG 15 STRL LF DISP TIS (BLADE) ×4 IMPLANT
BLADE SURG 15 STRL SS (BLADE) ×4
BNDG CMPR 9X4 STRL LF SNTH (GAUZE/BANDAGES/DRESSINGS)
BNDG ELASTIC 3X5.8 VLCR STR LF (GAUZE/BANDAGES/DRESSINGS) ×3 IMPLANT
BNDG ESMARK 4X9 LF (GAUZE/BANDAGES/DRESSINGS) IMPLANT
BNDG GAUZE ELAST 4 BULKY (GAUZE/BANDAGES/DRESSINGS) ×3 IMPLANT
CHLORAPREP W/TINT 26 (MISCELLANEOUS) ×3 IMPLANT
CORD BIPOLAR FORCEPS 12FT (ELECTRODE) ×3 IMPLANT
COVER BACK TABLE 60X90IN (DRAPES) ×3 IMPLANT
COVER MAYO STAND STRL (DRAPES) ×3 IMPLANT
CUFF TOURN SGL QUICK 18X4 (TOURNIQUET CUFF) ×3 IMPLANT
DRAPE EXTREMITY T 121X128X90 (DISPOSABLE) ×3 IMPLANT
DRAPE SURG 17X23 STRL (DRAPES) ×3 IMPLANT
DRSG PAD ABDOMINAL 8X10 ST (GAUZE/BANDAGES/DRESSINGS) ×3 IMPLANT
GAUZE SPONGE 4X4 12PLY STRL (GAUZE/BANDAGES/DRESSINGS) ×3 IMPLANT
GAUZE XEROFORM 1X8 LF (GAUZE/BANDAGES/DRESSINGS) ×3 IMPLANT
GLOVE BIO SURGEON STRL SZ7.5 (GLOVE) ×3 IMPLANT
GLOVE BIOGEL PI IND STRL 8 (GLOVE) ×2 IMPLANT
GLOVE BIOGEL PI INDICATOR 8 (GLOVE) ×1
GOWN STRL REUS W/ TWL LRG LVL3 (GOWN DISPOSABLE) ×2 IMPLANT
GOWN STRL REUS W/TWL LRG LVL3 (GOWN DISPOSABLE) ×2
GOWN STRL REUS W/TWL XL LVL3 (GOWN DISPOSABLE) ×3 IMPLANT
NDL HYPO 25X1 1.5 SAFETY (NEEDLE) ×2 IMPLANT
NEEDLE HYPO 25X1 1.5 SAFETY (NEEDLE) ×2 IMPLANT
NS IRRIG 1000ML POUR BTL (IV SOLUTION) ×3 IMPLANT
PACK BASIN DAY SURGERY FS (CUSTOM PROCEDURE TRAY) ×3 IMPLANT
PADDING CAST ABS 4INX4YD NS (CAST SUPPLIES) ×1
PADDING CAST ABS COTTON 4X4 ST (CAST SUPPLIES) ×2 IMPLANT
STOCKINETTE 4X48 STRL (DRAPES) ×3 IMPLANT
SUT ETHILON 4 0 PS 2 18 (SUTURE) ×3 IMPLANT
SYR BULB EAR ULCER 3OZ GRN STR (SYRINGE) ×3 IMPLANT
SYR CONTROL 10ML LL (SYRINGE) ×3 IMPLANT
TOWEL GREEN STERILE FF (TOWEL DISPOSABLE) ×6 IMPLANT
UNDERPAD 30X36 HEAVY ABSORB (UNDERPADS AND DIAPERS) ×3 IMPLANT

## 2022-01-11 NOTE — H&P (Signed)
Elijah Taylor is an 71 y.o. male.   Chief Complaint: carpal tunnel syndrome HPI: 71 yo male with numbness and tingling bilateral hands.  Nocturnal symptoms.  Positive nerve conduction studies.  He wishes to have left carpal tunnel release.  Allergies: No Known Allergies  Past Medical History:  Diagnosis Date   Arthritis    History of kidney stones    Insomnia    Pneumonia    Prostate cancer ([place])    Urinary bladder incontinence    Related to Prostectomy   Wears glasses     Past Surgical History:  Procedure Laterality Date   ANTERIOR CERVICAL DECOMP/DISCECTOMY FUSION N/A 05/01/2020   Procedure: Cervical Four-Five Anterior Cervical Decompression/Discectomy/Fusion with removal of Cervical Three-Four Hardware;  Surgeon: Eustace Moore, MD;  Location: [place];  Service: Neurosurgery;  Laterality: N/A;   CARPAL TUNNEL RELEASE     left   CARPAL TUNNEL RELEASE Right 12/01/2014   Procedure: RIGHT CARPAL TUNNEL RELEASE;  Surgeon: Daryll Brod, MD;  Location: [facility];  Service: Orthopedics;  Laterality: Right;   CATARACT EXTRACTION W/ INTRAOCULAR LENS IMPLANT Right    CATARACT EXTRACTION W/ INTRAOCULAR LENS IMPLANT Right    CERVICAL FUSION  2007   COLONOSCOPY     COLONOSCOPY WITH PROPOFOL N/A 11/07/2017   Procedure: COLONOSCOPY WITH PROPOFOL;  Surgeon: [name], MD;  Location: [facility];  Service: Gastroenterology;  Laterality: N/A;   CYSTOSCOPY MACROPLASTIQUE IMPLANT N/A 09/15/2015   Procedure: CYSTOSCOPY MACROPLASTIQUE IMPLANT;  Surgeon: Royston Cowper, MD;  Location: [facility];  Service: Urology;  Laterality: N/A;   CYSTOSCOPY/URETEROSCOPY/HOLMIUM LASER/STENT PLACEMENT Left 06/29/2018   Procedure: CYSTOSCOPY/URETEROSCOPY/HOLMIUM LASER/STENT PLACEMENT;  Surgeon: Billey Co, MD;  Location: [facility];  Service: Urology;  Laterality: Left;   EXTRACORPOREAL SHOCK WAVE LITHOTRIPSY Left 07/12/2018   Procedure: EXTRACORPOREAL SHOCK WAVE LITHOTRIPSY (ESWL);  Surgeon:  Billey Co, MD;  Location: [facility];  Service: Urology;  Laterality: Left;   EXTRACORPOREAL SHOCK WAVE LITHOTRIPSY Right 10/18/2018   Procedure: EXTRACORPOREAL SHOCK WAVE LITHOTRIPSY (ESWL);  Surgeon: Royston Cowper, MD;  Location: [facility];  Service: Urology;  Laterality: Right;   RETROPUBIC PROSTATECTOMY  2010   SHOULDER ARTHROSCOPY W/ ROTATOR CUFF REPAIR     right and left   TRIGGER FINGER RELEASE     x 4   TRIGGER FINGER RELEASE Left 09/02/2014   Procedure: RELEASE A-1 PULLEY LEFT INDEX FINGER;  Surgeon: Daryll Brod, MD;  Location: [place];  Service: Orthopedics;  Laterality: Left;  ANESTHESIA: IV REGIONAL FAB   TRIGGER FINGER RELEASE Right 05/16/2017   Procedure: RELEASE TRIGGER FINGER/A-1 PULLEY RIGHT MIDDLE FINGER;  Surgeon: Daryll Brod, MD;  Location: [facility];  Service: Orthopedics;  Laterality: Right;   TRIGGER FINGER RELEASE Right 03/28/2019   Procedure: RELEASE TRIGGER FINGER/A-1 PULLEY RIGHT INDEX;  Surgeon: Daryll Brod, MD;  Location: [facility];  Service: Orthopedics;  Laterality: Right;  FAB    Family History: Family History  Problem Relation Age of Onset   Cancer Mother        Brain Tumor   Kidney failure Father    Prostate cancer Father    Heart disease Brother    Heart attack Maternal Grandfather    Prostate cancer Maternal Uncle    Prostate cancer Maternal Uncle     Social History:   reports that he has never smoked. He has never used smokeless tobacco. He reports current alcohol use of about 1.0 standard drink per  week. He reports that he does not use drugs.  Medications: Medications Prior to Admission  Medication Sig Dispense Refill   aspirin EC 81 MG tablet Take 81 mg by mouth daily with supper.     diphenhydrAMINE HCl (ZZZQUIL) 50 MG/30ML LIQD Take 25 mg by mouth at bedtime.     diphenhydramine-acetaminophen (TYLENOL PM) 25-500 MG TABS tablet Take 1 tablet by mouth at bedtime.     methocarbamol  (ROBAXIN) 500 MG tablet Take 1 tablet (500 mg total) by mouth every 6 (six) hours as needed for muscle spasms. 45 tablet 0   Multiple Vitamins-Minerals (MULTIVITAMIN WITH MINERALS) tablet Take 1 tablet by mouth daily with supper.     triazolam (HALCION) 0.125 MG tablet Take 0.25 mg by mouth at bedtime as needed for sleep.      No results found for this or any previous visit (from the past 48 hour(s)).  No results found.    Blood pressure (!) 139/93, pulse 92, temperature 98.5 F (36.9 C), temperature source Oral, resp. rate 16, height '5\' 9"'$  (1.753 m), weight 85.1 kg, SpO2 100 %.  General appearance: alert, cooperative, and appears stated age Head: Normocephalic, without obvious abnormality, atraumatic Neck: supple, symmetrical, trachea midline Extremities: Intact sensation and capillary refill all digits.  +epl/fpl/io.  No wounds.  Pulses: 2+ and symmetric Skin: Skin color, texture, turgor normal. No rashes or lesions Neurologic: Grossly normal Incision/Wound: none  Assessment/Plan Left carpal tunnel syndrome.  Non operative and operative treatment options have been discussed with the patient and patient wishes to proceed with operative treatment. Risks, benefits, and alternatives of surgery have been discussed and the patient agrees with the plan of care.   Leanora Cover 01/11/2022, 1:13 PM

## 2022-01-11 NOTE — Anesthesia Preprocedure Evaluation (Addendum)
Anesthesia Evaluation  Patient identified by MRN, date of birth, ID band Patient awake    Reviewed: Allergy & Precautions, NPO status , Patient's Chart, lab work & pertinent test results  Airway Mallampati: II  TM Distance: >3 FB Neck ROM: Full    Dental no notable dental hx. (+) Dental Advisory Given, Teeth Intact   Pulmonary pneumonia,    Pulmonary exam normal breath sounds clear to auscultation       Cardiovascular negative cardio ROS Normal cardiovascular exam Rhythm:Regular Rate:Normal  ECG: rate 100. Normal sinus rhythm Left anterior fascicular block   Neuro/Psych negative psych ROS   GI/Hepatic negative GI ROS, Neg liver ROS,   Endo/Other  negative endocrine ROS  Renal/GU negative Renal ROS     Musculoskeletal  (+) Arthritis ,   Abdominal   Peds  Hematology negative hematology ROS (+)   Anesthesia Other Findings Failed back syndrome  Reproductive/Obstetrics                            Anesthesia Physical  Anesthesia Plan  ASA: 2  Anesthesia Plan: Bier Block and Bier Block-LIDOCAINE ONLY   Post-op Pain Management: Tylenol PO (pre-op)* and Celebrex PO (pre-op)*   Induction: Intravenous  PONV Risk Score and Plan: 2 and Ondansetron, Treatment may vary due to age or medical condition, Propofol infusion and TIVA  Airway Management Planned: Natural Airway  Additional Equipment:   Intra-op Plan:   Post-operative Plan:   Informed Consent: I have reviewed the patients History and Physical, chart, labs and discussed the procedure including the risks, benefits and alternatives for the proposed anesthesia with the patient or authorized representative who has indicated his/her understanding and acceptance.     Dental advisory given  Plan Discussed with: CRNA  Anesthesia Plan Comments:       Anesthesia Quick Evaluation

## 2022-01-11 NOTE — Anesthesia Postprocedure Evaluation (Signed)
Anesthesia Post Note  Patient: Elijah Taylor  Procedure(s) Performed: LEFT CARPAL TUNNEL RELEASE (Left: Wrist)     Patient location during evaluation: PACU Anesthesia Type: Bier Block Level of consciousness: awake and alert Pain management: pain level controlled Vital Signs Assessment: post-procedure vital signs reviewed and stable Respiratory status: spontaneous breathing Cardiovascular status: stable Anesthetic complications: no   No notable events documented.  Last Vitals:  Vitals:   01/11/22 1430 01/11/22 1500  BP: 105/75 120/80  Pulse: 78 77  Resp: (!) 21 16  Temp:  36.7 C  SpO2: 95% 96%    Last Pain:  Vitals:   01/11/22 1500  TempSrc:   PainSc: 0-No pain                 Nolon Nations

## 2022-01-11 NOTE — Transfer of Care (Signed)
Immediate Anesthesia Transfer of Care Note  Patient: Elijah Taylor  Procedure(s) Performed: LEFT CARPAL TUNNEL RELEASE (Left: Wrist)  Patient Location: PACU  Anesthesia Type:Bier block  Level of Consciousness: awake, alert , oriented and patient cooperative  Airway & Oxygen Therapy: Patient Spontanous Breathing and Patient connected to face mask oxygen  Post-op Assessment: Report given to RN and Post -op Vital signs reviewed and stable  Post vital signs: Reviewed and stable  Last Vitals:  Vitals Value Taken Time  BP    Temp    Pulse 79 01/11/22 1414  Resp    SpO2 98 % 01/11/22 1414  Vitals shown include unvalidated device data.  Last Pain:  Vitals:   01/11/22 1151  TempSrc: Oral  PainSc: 0-No pain      Patients Stated Pain Goal: 6 (32/95/18 8416)  Complications: No notable events documented.

## 2022-01-11 NOTE — Anesthesia Procedure Notes (Signed)
Procedure Name: MAC Date/Time: 01/11/2022 1:36 PM Performed by: Ezequiel Kayser, CRNA Pre-anesthesia Checklist: Patient identified, Emergency Drugs available, Suction available and Patient being monitored Patient Re-evaluated:Patient Re-evaluated prior to induction Oxygen Delivery Method: Simple face mask Preoxygenation: Pre-oxygenation with 100% oxygen

## 2022-01-11 NOTE — Discharge Instructions (Addendum)

## 2022-01-11 NOTE — Anesthesia Procedure Notes (Signed)
Anesthesia Regional Block: Bier block (IV Regional)   Pre-Anesthetic Checklist: , timeout performed,  Correct Patient, Correct Site, Correct Laterality,  Correct Procedure, Correct Position, site marked,  Risks and benefits discussed,  Surgical consent,  Pre-op evaluation,  At surgeon's request  Laterality: Left  Prep: alcohol swabs       Needles:  Injection technique: Single-shot      Additional Needles:   Narrative:  Start time: 01/11/2022 1:39 PM End time: 01/11/2022 1:39 PM  Performed by: Personally

## 2022-01-11 NOTE — Op Note (Signed)
01/11/2022 [facility]                              OPERATIVE REPORT   PREOPERATIVE DIAGNOSIS:  Left carpal tunnel syndrome.  POSTOPERATIVE DIAGNOSIS:  Left carpal tunnel syndrome.  PROCEDURE:  Left carpal tunnel release.  SURGEON:  Leanora Cover, MD  ASSISTANT:  none.  ANESTHESIA: Bier block with sedation  IV FLUIDS:  Per anesthesia flow sheet.  ESTIMATED BLOOD LOSS:  Minimal.  COMPLICATIONS:  None.  SPECIMENS:  None.  TOURNIQUET TIME:    Total Tourniquet Time Documented: Forearm (Left) - 29 minutes Total: Forearm (Left) - 29 minutes   DISPOSITION:  Stable to PACU.  LOCATION:  SURGERY CENTER  INDICATIONS:  71 yo male with numbness and tingling left hand.  Positive nerve conduction studies.  Nocturnal symptoms.  He wishes to have a carpal tunnel release for management of his symptoms.  Risks, benefits and alternatives of surgery were discussed including the risk of blood loss; infection; damage to nerves, vessels, tendons, ligaments, bone; failure of surgery; need for additional surgery; complications with wound healing; continued pain; recurrence of carpal tunnel syndrome; and damage to motor branch. He voiced understanding of these risks and elected to proceed.   OPERATIVE COURSE:  After being identified preoperatively by myself, the patient and I agreed upon the procedure and site of procedure.  The surgical site was marked.  Surgical consent had been signed.  He was given IV Ancef as preoperative antibiotic prophylaxis.  He was transferred to the operating room and placed on the operating room table in supine position with the Left upper extremity on an armboard.  Bier block anesthesia was induced by the anesthesiologist.  Left upper extremity was prepped and draped in normal sterile orthopaedic fashion.  A surgical pause was performed between the surgeons, anesthesia, and operating room staff, and all were in agreement as to the patient, procedure, and  site of procedure.  Tourniquet at the proximal aspect of the forearm had been inflated for the Bier block  Incision was made over the transverse carpal ligament and carried into the subcutaneous tissues by spreading technique.  Bipolar electrocautery was used to obtain hemostasis.  The palmar fascia was sharply incised.  The fascia was thickened like scar tissue.  The transverse carpal ligament was identified and sharply incised.  It was incised distally first.  The flexor tendons were identified.  The flexor tendon to the ring finger was identified and retracted radially.  The transverse carpal ligament was then incised proximally.  Scissors were used to split the distal aspect of the volar antebrachial fascia.  A finger was placed into the wound to ensure complete decompression, which was the case.  The nerve was examined.  It was adherent to the radial leaflet.  The motor branch was identified and was intact.  The wound was copiously irrigated with sterile saline.  It was then closed with 4-0 nylon in a horizontal mattress fashion.  It was injected with 0.25% plain Marcaine to aid in postoperative analgesia.  It was dressed with sterile Xeroform, 4x4s, an ABD, and wrapped with Kerlix and an Ace bandage.  Tourniquet was deflated at 29 minutes.  Fingertips were pink with brisk capillary refill after deflation of the tourniquet.  Operative drapes were broken down.  The patient was awoken from anesthesia safely.  He was transferred back to stretcher and taken to the PACU in stable condition.  I will see him back in the office in 1 week for postoperative followup.  I will give him a prescription for Norco 5/325 1-2 tabs PO q6 hours prn pain, dispense # 15.    Leanora Cover, MD Electronically signed, 01/11/22

## 2022-01-12 ENCOUNTER — Encounter (HOSPITAL_BASED_OUTPATIENT_CLINIC_OR_DEPARTMENT_OTHER): Payer: Self-pay | Admitting: Orthopedic Surgery

## 2022-01-12 NOTE — Progress Notes (Signed)
Unable to leave VM

## 2022-04-06 IMAGING — CT CT CERVICAL SPINE W/O CM
2 series · 10 of 14 positions shown, 12 images · non-contrast
Comparison: No recent imaging.  CT cervical from 6007

CLINICAL DATA: Neck pain radiating to head

EXAM:
CT CERVICAL SPINE WITHOUT CONTRAST
TECHNIQUE: Multidetector CT imaging of the cervical spine was performed without
intravenous contrast. Multiplanar CT image reconstructions were also
generated.

[Series 2: cspine soft · axial · 0.29mm/px · z∈[-320,-180]mm · 5 of 106 slices shown]
[im 18/106  soft-tissue]
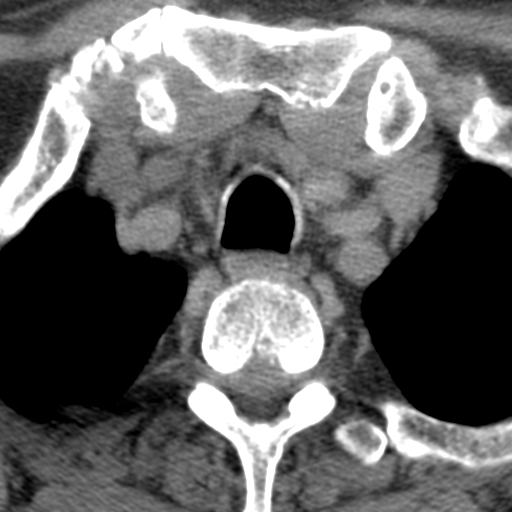
[im 36/106  soft-tissue]
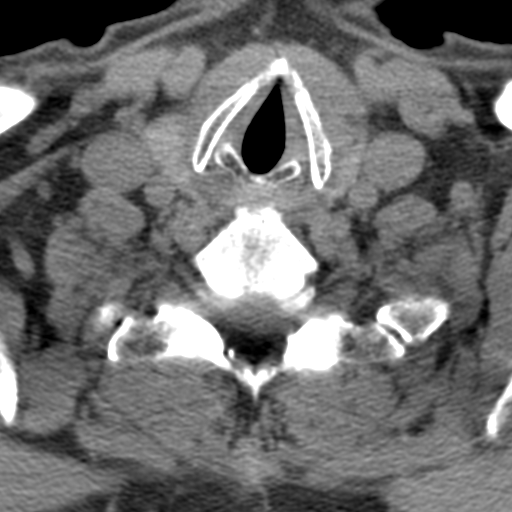
[im 53/106  soft-tissue]
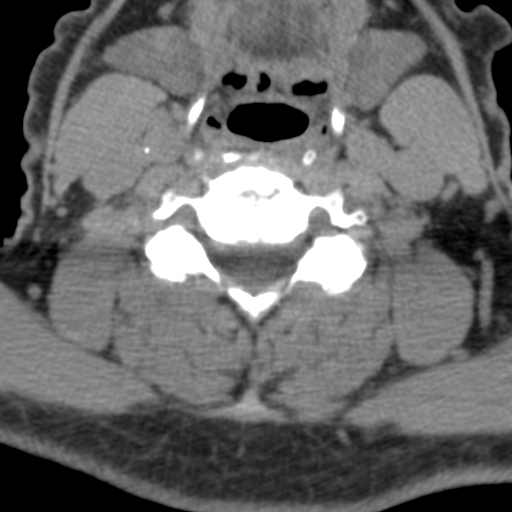
[im 71/106  soft-tissue]
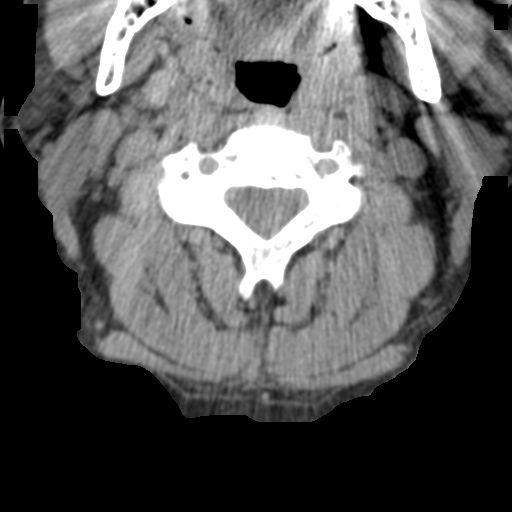
[im 88/106  soft-tissue]
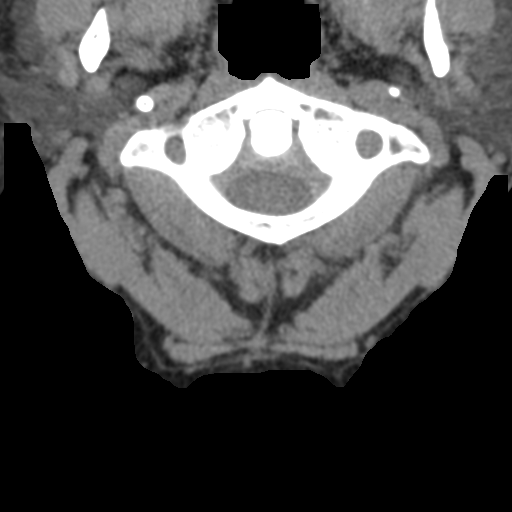

[Series 12: angled axial · axial · 0.20mm/px · z∈[-331,-186]mm · 5 of 118 slices shown, 7 images]
[im 20/118  soft-tissue]
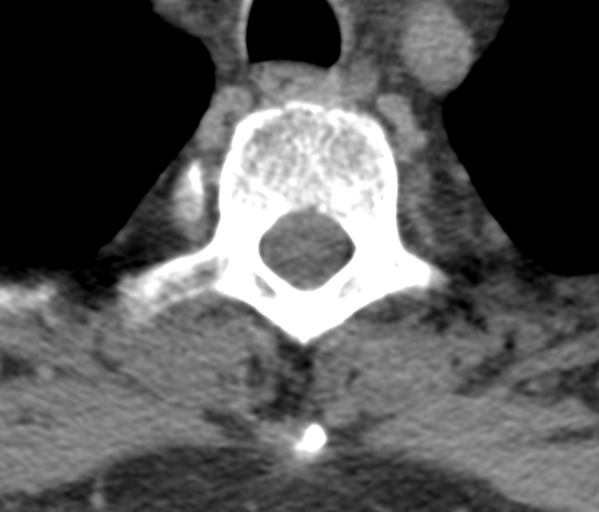
[im 20/118  bone]
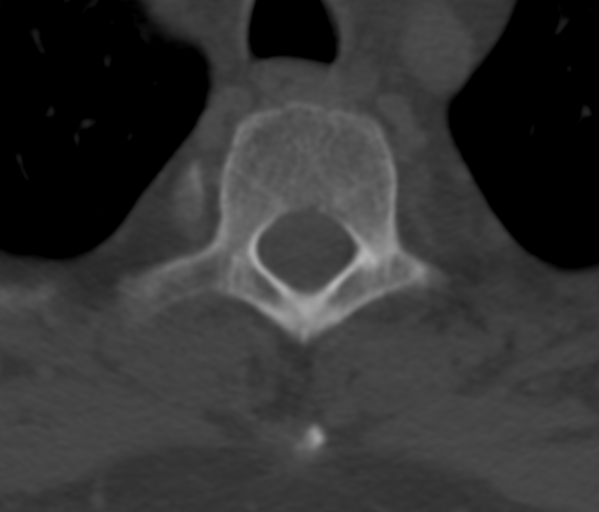
[im 40/118  bone]
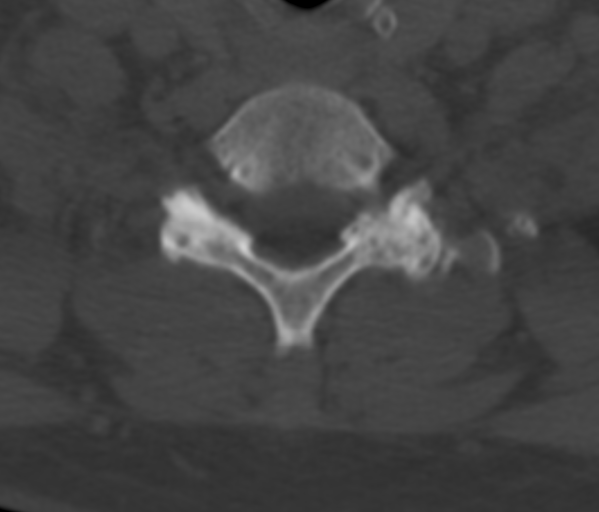
[im 59/118  bone]
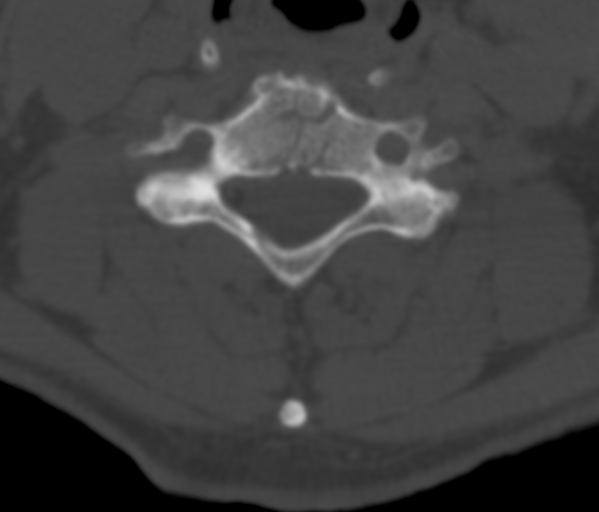
[im 79/118  bone]
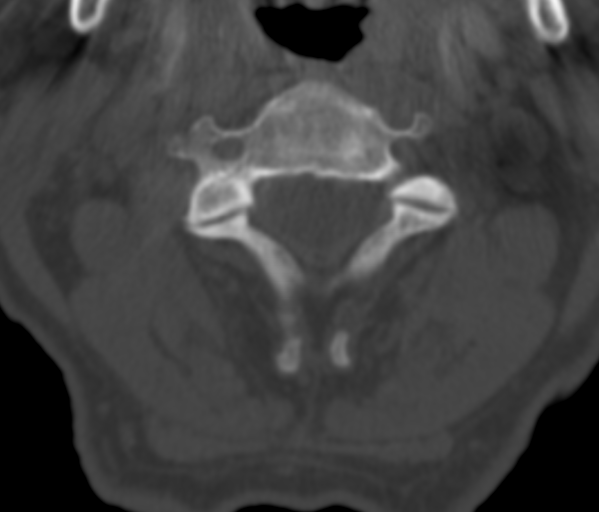
[im 98/118  soft-tissue]
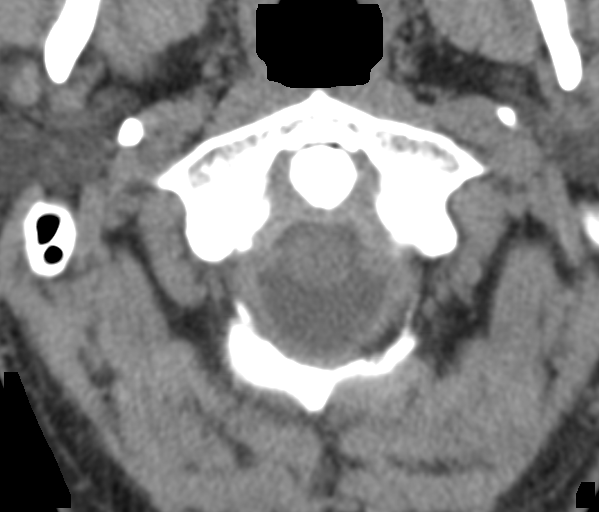
[im 98/118  bone]
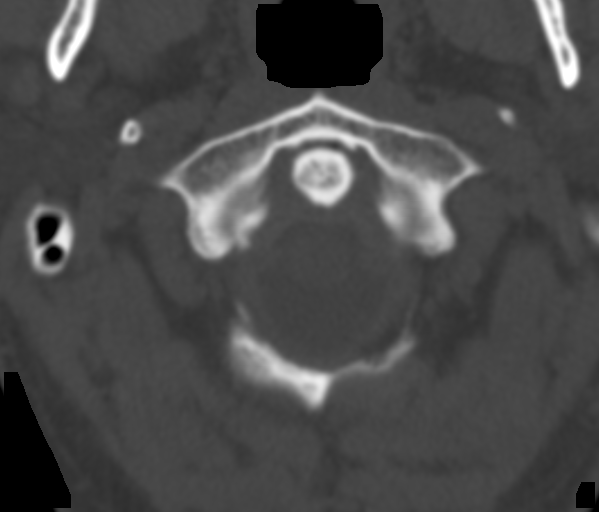

[10 of 14 positions shown; findings below may reference images not displayed]

FINDINGS: Alignment: No significant anteroposterior listhesis

Skull base and vertebrae: Prior anterior cervical fusion at C3-C4
with plate and screw fixation and well incorporated interbody graft.
There is associated streak artifact. Stable vertebral body heights.

Soft tissues and spinal canal: No prevertebral fluid or swelling. No
visible epidural collection.

Disc levels:

C2-C3:  No significant canal or foraminal stenosis.

C3-C4: Operative level. Canal and foramina are obscured by artifact.
There is facet hypertrophy causing mild left foraminal stenosis.

C4-C5: Probable disc bulge with small endplate osteophytes and mild
uncovertebral hypertrophy. No apparent canal stenosis. Minor
foraminal stenosis.

C5-C6: Probable disc bulge with small endplate osteophytes and mild
facet and uncovertebral hypertrophy. No apparent canal or foraminal
stenosis.

C6-C7: Probable disc bulge with endplate osteophytes and
uncovertebral greater than facet hypertrophy. No apparent canal
stenosis. Mild to moderate foraminal stenosis.

C7-T1: Left greater than right facet hypertrophy. No apparent canal
or right foraminal stenosis. Mild to moderate left foraminal
stenosis.

Upper chest: Negative.

Other: None.
IMPRESSION: Degenerative changes as detailed above. No apparent significant
canal stenosis. Foraminal stenosis is greatest at C6-C7 and C7-T1.

## 2022-06-22 ENCOUNTER — Telehealth: Payer: Self-pay

## 2022-06-22 ENCOUNTER — Ambulatory Visit (INDEPENDENT_AMBULATORY_CARE_PROVIDER_SITE_OTHER): Payer: Medicare Other | Admitting: Family Medicine

## 2022-06-22 ENCOUNTER — Encounter: Payer: Self-pay | Admitting: Family Medicine

## 2022-06-22 ENCOUNTER — Other Ambulatory Visit: Payer: Self-pay

## 2022-06-22 VITALS — BP 126/78 | HR 88 | Temp 97.8°F | Wt 189.0 lb

## 2022-06-22 DIAGNOSIS — Z23 Encounter for immunization: Secondary | ICD-10-CM

## 2022-06-22 DIAGNOSIS — Z0000 Encounter for general adult medical examination without abnormal findings: Secondary | ICD-10-CM

## 2022-06-22 DIAGNOSIS — C61 Malignant neoplasm of prostate: Secondary | ICD-10-CM

## 2022-06-22 DIAGNOSIS — Z86010 Personal history of colonic polyps: Secondary | ICD-10-CM

## 2022-06-22 DIAGNOSIS — S41101D Unspecified open wound of right upper arm, subsequent encounter: Secondary | ICD-10-CM | POA: Diagnosis not present

## 2022-06-22 DIAGNOSIS — Z7189 Other specified counseling: Secondary | ICD-10-CM

## 2022-06-22 DIAGNOSIS — E7800 Pure hypercholesterolemia, unspecified: Secondary | ICD-10-CM | POA: Diagnosis not present

## 2022-06-22 DIAGNOSIS — Z8546 Personal history of malignant neoplasm of prostate: Secondary | ICD-10-CM

## 2022-06-22 DIAGNOSIS — S46901D Unspecified injury of unspecified muscle, fascia and tendon at shoulder and upper arm level, right arm, subsequent encounter: Secondary | ICD-10-CM | POA: Diagnosis not present

## 2022-06-22 DIAGNOSIS — Z1211 Encounter for screening for malignant neoplasm of colon: Secondary | ICD-10-CM

## 2022-06-22 DIAGNOSIS — S41101A Unspecified open wound of right upper arm, initial encounter: Secondary | ICD-10-CM

## 2022-06-22 LAB — URINALYSIS, ROUTINE W REFLEX MICROSCOPIC
Bilirubin, UA: NEGATIVE
Glucose, UA: NEGATIVE
Ketones, UA: NEGATIVE
Leukocytes,UA: NEGATIVE
Nitrite, UA: NEGATIVE
Protein,UA: NEGATIVE
RBC, UA: NEGATIVE
Specific Gravity, UA: 1.025 (ref 1.005–1.030)
Urobilinogen, Ur: 1 mg/dL (ref 0.2–1.0)
pH, UA: 7 (ref 5.0–7.5)

## 2022-06-22 MED ORDER — GOLYTELY 236 G PO SOLR: 4000.0000 mL | Freq: Once | ORAL | 0 refills | Status: AC

## 2022-06-22 NOTE — Progress Notes (Signed)
BP 126/78   Pulse 88   Temp 97.8 F (36.6 C)   Wt 189 lb (85.7 kg)   SpO2 96%   BMI 27.91 kg/m    Subjective:    Patient ID: Elijah Taylor, male    DOB: Jan 05, 1951, 71 y.o.   MRN: [id number]  HPI: Elijah Taylor is a 71 y.o. male presenting on 06/22/2022 for comprehensive medical examination. Current medical complaints include:  Has a history of prostate cancer. Was seeing Dr. Boneta Lucks. Last say him in 2022. Doing well.   HYPERLIPIDEMIA Hyperlipidemia status:  stable Satisfied with current treatment?  yes Side effects:  N/A Past cholesterol meds: none Supplements: none Aspirin:  no The ASCVD Risk score (Arnett DK, et al., 2019) failed to calculate for the following reasons:   Cannot find a previous HDL lab   Cannot find a previous total cholesterol lab Chest pain:  no Coronary artery disease:  no  Interim Problems from his last visit: no  Functional Status Survey: Is the patient deaf or have difficulty hearing?: No Does the patient have difficulty seeing, even when wearing glasses/contacts?: No Does the patient have difficulty concentrating, remembering, or making decisions?: Yes Does the patient have difficulty walking or climbing stairs?: No Does the patient have difficulty dressing or bathing?: No Does the patient have difficulty doing errands alone such as visiting a doctor's office or shopping?: No  FALL RISK:    06/22/2022    9:48 AM 08/19/2019    9:07 AM 03/11/2019    3:03 PM 06/25/2018    4:01 PM 03/01/2017    2:15 PM  [place] in the past year? 0 0  0 No  Comment   [employer] Telephone Survey: data to providers prior to load [employer] Survey: data to providers prior to load   Number falls in past yr: 0 0     Comment   Emmi Telephone Survey Actual Response =     Injury with Fall? 0 0     Risk for fall due to : No Fall Risks      Follow up Falls evaluation completed        Depression Screen    06/22/2022    9:49 AM 08/19/2019    9:07  AM 10/30/2017   10:50 AM 03/01/2017    2:15 PM 10/06/2016    1:01 PM  Depression screen PHQ 2/9  Decreased Interest 0 0 0 0 0  Down, Depressed, Hopeless 0 0 0 0 0  PHQ - 2 Score 0 0 0 0 0  Altered sleeping 1  0    Tired, decreased energy 0  0    Change in appetite 0  0    Feeling bad or failure about yourself  0  0    Trouble concentrating 0  0    Moving slowly or fidgety/restless 0  0    Suicidal thoughts 0  0    PHQ-9 Score 1  0      Advanced Directives Does not have one  Past Medical History:  Past Medical History:  Diagnosis Date   Arthritis    History of kidney stones    Insomnia    Pneumonia    Prostate cancer ([place])    Urinary bladder incontinence    Related to Prostectomy   Wears glasses     Surgical History:  Past Surgical History:  Procedure Laterality Date   ANTERIOR CERVICAL DECOMP/DISCECTOMY FUSION N/A 05/01/2020   Procedure: Cervical  Four-Five Anterior Cervical Decompression/Discectomy/Fusion with removal of Cervical Three-Four Hardware;  Surgeon: Eustace Moore, MD;  Location: [facility];  Service: Neurosurgery;  Laterality: N/A;   CARPAL TUNNEL RELEASE     left   CARPAL TUNNEL RELEASE Right 12/01/2014   Procedure: RIGHT CARPAL TUNNEL RELEASE;  Surgeon: Daryll Brod, MD;  Location: [facility];  Service: Orthopedics;  Laterality: Right;   CARPAL TUNNEL RELEASE Left 01/11/2022   Procedure: LEFT CARPAL TUNNEL RELEASE;  Surgeon: Leanora Cover, MD;  Location: [facility];  Service: Orthopedics;  Laterality: Left;   CATARACT EXTRACTION W/ INTRAOCULAR LENS IMPLANT Right    CATARACT EXTRACTION W/ INTRAOCULAR LENS IMPLANT Right    CERVICAL FUSION  2007   COLONOSCOPY     COLONOSCOPY WITH PROPOFOL N/A 11/07/2017   Procedure: COLONOSCOPY WITH PROPOFOL;  Surgeon: [name], MD;  Location: [facility];  Service: Gastroenterology;  Laterality: N/A;   CYSTOSCOPY MACROPLASTIQUE IMPLANT N/A 09/15/2015   Procedure: CYSTOSCOPY MACROPLASTIQUE IMPLANT;   Surgeon: Royston Cowper, MD;  Location: [facility];  Service: Urology;  Laterality: N/A;   CYSTOSCOPY/URETEROSCOPY/HOLMIUM LASER/STENT PLACEMENT Left 06/29/2018   Procedure: CYSTOSCOPY/URETEROSCOPY/HOLMIUM LASER/STENT PLACEMENT;  Surgeon: Billey Co, MD;  Location: [facility];  Service: Urology;  Laterality: Left;   EXTRACORPOREAL SHOCK WAVE LITHOTRIPSY Left 07/12/2018   Procedure: EXTRACORPOREAL SHOCK WAVE LITHOTRIPSY (ESWL);  Surgeon: Billey Co, MD;  Location: [facility];  Service: Urology;  Laterality: Left;   EXTRACORPOREAL SHOCK WAVE LITHOTRIPSY Right 10/18/2018   Procedure: EXTRACORPOREAL SHOCK WAVE LITHOTRIPSY (ESWL);  Surgeon: Royston Cowper, MD;  Location: [facility];  Service: Urology;  Laterality: Right;   RETROPUBIC PROSTATECTOMY  2010   SHOULDER ARTHROSCOPY W/ ROTATOR CUFF REPAIR     right and left   TRIGGER FINGER RELEASE     x 4   TRIGGER FINGER RELEASE Left 09/02/2014   Procedure: RELEASE A-1 PULLEY LEFT INDEX FINGER;  Surgeon: Daryll Brod, MD;  Location: [facility];  Service: Orthopedics;  Laterality: Left;  ANESTHESIA: IV REGIONAL FAB   TRIGGER FINGER RELEASE Right 05/16/2017   Procedure: RELEASE TRIGGER FINGER/A-1 PULLEY RIGHT MIDDLE FINGER;  Surgeon: Daryll Brod, MD;  Location: [facility];  Service: Orthopedics;  Laterality: Right;   TRIGGER FINGER RELEASE Right 03/28/2019   Procedure: RELEASE TRIGGER FINGER/A-1 PULLEY RIGHT INDEX;  Surgeon: Daryll Brod, MD;  Location: [facility];  Service: Orthopedics;  Laterality: Right;  FAB    Medications:  Current Outpatient Medications on File Prior to Visit  Medication Sig   aspirin EC 81 MG tablet Take 81 mg by mouth daily with supper.   diphenhydrAMINE HCl (ZZZQUIL) 50 MG/30ML LIQD Take 25 mg by mouth at bedtime.   diphenhydramine-acetaminophen (TYLENOL PM) 25-500 MG TABS tablet Take 1 tablet by mouth at bedtime.   Multiple Vitamins-Minerals (MULTIVITAMIN WITH MINERALS) tablet Take  1 tablet by mouth daily with supper.   triazolam (HALCION) 0.125 MG tablet Take 0.25 mg by mouth at bedtime as needed for sleep.   No current facility-administered medications on file prior to visit.    Allergies:  No Known Allergies  Social History:  Social History   Socioeconomic History   Marital status: Married    Spouse name: Not on file   Number of children: 2   Years of education: Not on file   Highest education level: Not on file  Occupational History   Not on file  Tobacco Use   Smoking status: Never   Smokeless tobacco:  Never  Vaping Use   Vaping Use: Never used  Substance and Sexual Activity   Alcohol use: Yes    Alcohol/week: 1.0 standard drink of alcohol    Types: 1 Glasses of wine per week    Comment: occasionally   Drug use: No   Sexual activity: Not Currently  Other Topics Concern   Not on file  Social History Narrative   Lives with wife; in [place]; retd. mechanic maintenance; no smoking; on alcohol    Social Determinants of Health   Financial Resource Strain: Not on file  Food Insecurity: Not on file  Transportation Needs: Not on file  Physical Activity: Not on file  Stress: Not on file  Social Connections: Not on file  Intimate Partner Violence: Not on file   Social History   Tobacco Use  Smoking Status Never  Smokeless Tobacco Never   Social History   Substance and Sexual Activity  Alcohol Use Yes   Alcohol/week: 1.0 standard drink of alcohol   Types: 1 Glasses of wine per week   Comment: occasionally    Family History:  Family History  Problem Relation Age of Onset   Cancer Mother        Brain Tumor   Kidney failure Father    Prostate cancer Father    Heart disease Brother    Heart attack Maternal Grandfather    Prostate cancer Maternal Uncle    Prostate cancer Maternal Uncle     Past medical history, surgical history, medications, allergies, family history and social history reviewed with patient today and changes  made to appropriate areas of the chart.   Review of Systems  Constitutional: Negative.   HENT: Negative.    Eyes: Negative.   Respiratory: Negative.    Cardiovascular: Negative.   Gastrointestinal:  Positive for heartburn (with food choices). Negative for abdominal pain, blood in stool, constipation, diarrhea, melena, nausea and vomiting.  Genitourinary: Negative.   Musculoskeletal: Negative.   Skin: Negative.   Neurological: Negative.   Endo/Heme/Allergies:  Positive for environmental allergies. Negative for polydipsia. Does not bruise/bleed easily.  Psychiatric/Behavioral: Negative.     All other ROS negative except what is listed above and in the HPI.      Objective:    BP 126/78   Pulse 88   Temp 97.8 F (36.6 C)   Wt 189 lb (85.7 kg)   SpO2 96%   BMI 27.91 kg/m   Wt Readings from Last 3 Encounters:  06/22/22 189 lb (85.7 kg)  01/11/22 187 lb 9.8 oz (85.1 kg)  11/30/20 177 lb 9.6 oz (80.6 kg)     Physical Exam Vitals and nursing note reviewed.  Constitutional:      General: He is not in acute distress.    Appearance: Normal appearance. He is not ill-appearing, toxic-appearing or diaphoretic.  HENT:     Head: Normocephalic and atraumatic.     Right Ear: Tympanic membrane, ear canal and external ear normal. There is no impacted cerumen.     Left Ear: Tympanic membrane, ear canal and external ear normal. There is no impacted cerumen.     Nose: Nose normal. No congestion or rhinorrhea.     Mouth/Throat:     Mouth: Mucous membranes are moist.     Pharynx: Oropharynx is clear. No oropharyngeal exudate or posterior oropharyngeal erythema.  Eyes:     General: No scleral icterus.       Right eye: No discharge.        Left  eye: No discharge.     Extraocular Movements: Extraocular movements intact.     Conjunctiva/sclera: Conjunctivae normal.     Pupils: Pupils are equal, round, and reactive to light.  Neck:     Vascular: No carotid bruit.  Cardiovascular:     Rate  and Rhythm: Normal rate and regular rhythm.     Pulses: Normal pulses.     Heart sounds: No murmur heard.    No friction rub. No gallop.  Pulmonary:     Effort: Pulmonary effort is normal. No respiratory distress.     Breath sounds: Normal breath sounds. No stridor. No wheezing, rhonchi or rales.  Chest:     Chest wall: No tenderness.  Abdominal:     General: Abdomen is flat. Bowel sounds are normal. There is no distension.     Palpations: Abdomen is soft. There is no mass.     Tenderness: There is no abdominal tenderness. There is no right CVA tenderness, left CVA tenderness, guarding or rebound.     Hernia: No hernia is present.  Genitourinary:    Comments: Genital exam deferred with shared decision making Musculoskeletal:        General: No swelling, tenderness, deformity or signs of injury.     Cervical back: Normal range of motion and neck supple. No rigidity. No muscular tenderness.     Right lower leg: No edema.     Left lower leg: No edema.  Lymphadenopathy:     Cervical: No cervical adenopathy.  Skin:    General: Skin is warm and dry.     Capillary Refill: Capillary refill takes less than 2 seconds.     Coloration: Skin is not jaundiced or pale.     Findings: No bruising, erythema, lesion or rash.  Neurological:     General: No focal deficit present.     Mental Status: He is alert and oriented to person, place, and time.     Cranial Nerves: No cranial nerve deficit.     Sensory: No sensory deficit.     Motor: No weakness.     Coordination: Coordination normal.     Gait: Gait normal.     Deep Tendon Reflexes: Reflexes normal.  Psychiatric:        Mood and Affect: Mood normal.        Behavior: Behavior normal.        Thought Content: Thought content normal.        Judgment: Judgment normal.        06/22/2022   10:10 AM  6CIT Screen  What Year? 0 points  What month? 0 points  What time? 0 points  Count back from 20 0 points  Months in reverse 0 points   Repeat phrase 2 points  Total Score 2 points    Results for orders placed or performed during the hospital encounter of 73/41/93  Basic metabolic panel  Result Value Ref Range   Sodium 139 135 - 145 mmol/L   Potassium 4.3 3.5 - 5.1 mmol/L   Chloride 109 98 - 111 mmol/L   CO2 22 22 - 32 mmol/L   Glucose, Bld 90 70 - 99 mg/dL   BUN 17 8 - 23 mg/dL   Creatinine, Ser 1.13 0.61 - 1.24 mg/dL   Calcium 9.1 8.9 - 10.3 mg/dL   GFR, Estimated >60 >60 mL/min   Anion gap 8 5 - 15  CBC WITH DIFFERENTIAL  Result Value Ref Range   WBC 12.9 (H) 4.0 -  10.5 K/uL   RBC 5.01 4.22 - 5.81 MIL/uL   Hemoglobin 15.6 13.0 - 17.0 g/dL   HCT 47.6 39.0 - 52.0 %   MCV 95.0 80.0 - 100.0 fL   MCH 31.1 26.0 - 34.0 pg   MCHC 32.8 30.0 - 36.0 g/dL   RDW 12.4 11.5 - 15.5 %   Platelets 335 150 - 400 K/uL   nRBC 0.0 0.0 - 0.2 %   Neutrophils Relative % 63 %   Neutro Abs 8.3 (H) 1.7 - 7.7 K/uL   Lymphocytes Relative 23 %   Lymphs Abs 2.9 0.7 - 4.0 K/uL   Monocytes Relative 11 %   Monocytes Absolute 1.4 (H) 0.1 - 1.0 K/uL   Eosinophils Relative 1 %   Eosinophils Absolute 0.1 0.0 - 0.5 K/uL   Basophils Relative 1 %   Basophils Absolute 0.1 0.0 - 0.1 K/uL   Immature Granulocytes 1 %   Abs Immature Granulocytes 0.09 (H) 0.00 - 0.07 K/uL  Protime-INR  Result Value Ref Range   Prothrombin Time 14.4 11.4 - 15.2 seconds   INR 1.1 0.8 - 1.2  Type and screen [place]  Result Value Ref Range   ABO/RH(D) A POS    Antibody Screen NEG    Sample Expiration      12/03/2020,2359 Performed at [facility], [place] [address], [place], [place] [address]       Assessment & Plan:   Problem List Items Addressed This Visit       Genitourinary   Prostate cancer [facility])    Following with Dr. Boneta Lucks. Will check labs today. Continue to monitor. Call with any concerns.       Relevant Orders   PSA   Urinalysis, Routine w reflex microscopic     Other   Hypercholesteremia    Rechecking labs  today. Await results. Treat as needed.       Relevant Orders   Comprehensive metabolic panel   CBC with Differential/Platelet   Lipid Panel w/o Chol/HDL Ratio   TSH   Advanced care planning/counseling discussion    A voluntary discussion about advance care planning including the explanation and discussion of advance directives was extensively discussed  with the patient for 5 minutes with patient present.  Explanation about the health care proxy and Living will was reviewed and packet with forms with explanation of how to fill them out was given.  During this discussion, the patient was able to identify a health care proxy as his wife and plans to fill out the paperwork required.  Patient was offered a separate [place] visit for further assistance with forms.         Other Visit Diagnoses     Encounter for annual wellness exam in Medicare patient    -  Primary   Preventative care discussed as below.   Open wound of right upper extremity with tendon injury, initial encounter       Healing well. Due for Td. Given today.   Relevant Orders   Td : Tetanus/diphtheria >7yo Preservative  free (Completed)   Screening for colon cancer       Due in April. Referral to GI placed today.   Relevant Orders   Ambulatory referral to Gastroenterology   Need for vaccination for pneumococcus       Will return for prevnar.   Relevant Orders   Pneumococcal conjugate vaccine 20-valent (Prevnar 20)        Preventative Services:  Health Risk Assessment and Personalized Prevention Plan: Done today Bone Mass Measurements: N/A CVD Screening: Done today Colon Cancer Screening: Up to date Depression Screening: Done today Diabetes Screening: Done today Glaucoma Screening: See your eye doctor Hepatitis B vaccine: N/A Hepatitis C screening: Up to date.  HIV Screening: Up to date Flu Vaccine: Declined Lung cancer Screening: N/A Obesity Screening: Done today Pneumonia Vaccines: Will return  for it STI Screening: N/A PSA screening: Done today  LABORATORY TESTING:  Health maintenance labs ordered today as discussed above.   The natural history of prostate cancer and ongoing controversy regarding screening and potential treatment outcomes of prostate cancer has been discussed with the patient. The meaning of a false positive PSA and a false negative PSA has been discussed. He indicates understanding of the limitations of this screening test and wishes to proceed with screening PSA testing.   IMMUNIZATIONS:   - Tdap: Tetanus vaccination status reviewed: Td vaccination indicated and given today. - Influenza: Refused - Prevnar:  Will get him back to get one - Zostavax vaccine: Refused  SCREENING: - Colonoscopy: Up to date  Discussed with patient purpose of the colonoscopy is to detect colon cancer at curable precancerous or early stages   Follow up plan: NEXT PREVENTATIVE PHYSICAL DUE IN 1 YEAR. Return in about 1 year (around 06/23/2023) for wellness/follow up, as able for nurse only for prevnar 20.

## 2022-06-22 NOTE — Assessment & Plan Note (Signed)
Following with Dr. Boneta Lucks. Will check labs today. Continue to monitor. Call with any concerns.

## 2022-06-22 NOTE — Assessment & Plan Note (Signed)
Rechecking labs today. Await results. Treat as needed.  °

## 2022-06-22 NOTE — Assessment & Plan Note (Signed)
A voluntary discussion about advance care planning including the explanation and discussion of advance directives was extensively discussed  with the patient for 5 minutes with patient present.  Explanation about the health care proxy and Living will was reviewed and packet with forms with explanation of how to fill them out was given.  During this discussion, the patient was able to identify a health care proxy as his wife and plans to fill out the paperwork required.  Patient was offered a separate [facility] visit for further assistance with forms.

## 2022-06-22 NOTE — Patient Instructions (Signed)
Preventative Services:  Health Risk Assessment and Personalized Prevention Plan: Done today Bone Mass Measurements: N/A CVD Screening: Done today Colon Cancer Screening: Up to date Depression Screening: Done today Diabetes Screening: Done today Glaucoma Screening: See your eye doctor Hepatitis B vaccine: N/A Hepatitis C screening: Up to date.  HIV Screening: Up to date Flu Vaccine: Declined Lung cancer Screening: N/A Obesity Screening: Done today Pneumonia Vaccines: Will return for it STI Screening: N/A PSA screening: Done today

## 2022-06-22 NOTE — Telephone Encounter (Signed)
Gastroenterology Pre-Procedure Review  Request Date: 11/14/22 Requesting Physician: Dr. Vicente Males  Patient stated he was familiar with Golytely prep as opposed to [place].  I asked if he wanted to use this one or the Suprep.  He said he would like to use Golytely-this way he doesn't have to get up to do a second bottle of prep.  PATIENT REVIEW QUESTIONS: The patient responded to the following health history questions as indicated:    1. Are you having any GI issues? no 2. Do you have a personal history of Polyps? yes (2019 colonoscopy performed by Dr. Vicente Males) 3. Do you have a family history of Colon Cancer or Polyps? no 4. Diabetes Mellitus? no 5. Joint replacements in the past 12 months?no 6. Major health problems in the past 3 months?no 7. Any artificial heart valves, MVP, or defibrillator?no    MEDICATIONS & ALLERGIES:    Patient reports the following regarding taking any anticoagulation/antiplatelet therapy:   Plavix, Coumadin, Eliquis, Xarelto, Lovenox, Pradaxa, Brilinta, or Effient? no Aspirin? yes ('81mg'$  daily)  Patient confirms/reports the following medications:  Current Outpatient Medications  Medication Sig Dispense Refill   aspirin EC 81 MG tablet Take 81 mg by mouth daily with supper.     diphenhydrAMINE HCl (ZZZQUIL) 50 MG/30ML LIQD Take 25 mg by mouth at bedtime.     diphenhydramine-acetaminophen (TYLENOL PM) 25-500 MG TABS tablet Take 1 tablet by mouth at bedtime.     Multiple Vitamins-Minerals (MULTIVITAMIN WITH MINERALS) tablet Take 1 tablet by mouth daily with supper.     triazolam (HALCION) 0.125 MG tablet Take 0.25 mg by mouth at bedtime as needed for sleep.     No current facility-administered medications for this visit.    Patient confirms/reports the following allergies:  No Known Allergies  No orders of the defined types were placed in this encounter.   AUTHORIZATION INFORMATION Primary Insurance: 1D#: Group #:  Secondary Insurance: 1D#: Group  #:  SCHEDULE INFORMATION: Date: 11/14/22 Time: Location: [facility]

## 2022-06-23 ENCOUNTER — Ambulatory Visit (INDEPENDENT_AMBULATORY_CARE_PROVIDER_SITE_OTHER): Payer: Medicare Other

## 2022-06-23 DIAGNOSIS — Z23 Encounter for immunization: Secondary | ICD-10-CM | POA: Diagnosis not present

## 2022-06-23 LAB — COMPREHENSIVE METABOLIC PANEL
ALT: 80 IU/L — ABNORMAL HIGH (ref 0–44)
AST: 56 IU/L — ABNORMAL HIGH (ref 0–40)
Albumin/Globulin Ratio: 1.6 (ref 1.2–2.2)
Albumin: 4.4 g/dL (ref 3.8–4.8)
Alkaline Phosphatase: 66 IU/L (ref 44–121)
BUN/Creatinine Ratio: 14 (ref 10–24)
BUN: 16 mg/dL (ref 8–27)
Bilirubin Total: 0.8 mg/dL (ref 0.0–1.2)
CO2: 25 mmol/L (ref 20–29)
Calcium: 9.5 mg/dL (ref 8.6–10.2)
Chloride: 106 mmol/L (ref 96–106)
Creatinine, Ser: 1.17 mg/dL (ref 0.76–1.27)
Globulin, Total: 2.7 g/dL (ref 1.5–4.5)
Glucose: 78 mg/dL (ref 70–99)
Potassium: 4.8 mmol/L (ref 3.5–5.2)
Sodium: 142 mmol/L (ref 134–144)
Total Protein: 7.1 g/dL (ref 6.0–8.5)
eGFR: 66 mL/min/{1.73_m2} (ref >59)

## 2022-06-23 LAB — CBC WITH DIFFERENTIAL/PLATELET
Basophils Absolute: 0.1 10*3/uL (ref 0.0–0.2)
Basos: 1 %
EOS (ABSOLUTE): 0.4 10*3/uL (ref 0.0–0.4)
Eos: 5 %
Hematocrit: 49.4 % (ref 37.5–51.0)
Hemoglobin: 16.5 g/dL (ref 13.0–17.7)
Immature Grans (Abs): 0 10*3/uL (ref 0.0–0.1)
Immature Granulocytes: 0 %
Lymphocytes Absolute: 3.3 10*3/uL — ABNORMAL HIGH (ref 0.7–3.1)
Lymphs: 43 %
MCH: 31.3 pg (ref 26.6–33.0)
MCHC: 33.4 g/dL (ref 31.5–35.7)
MCV: 94 fL (ref 79–97)
Monocytes Absolute: 1.1 10*3/uL — ABNORMAL HIGH (ref 0.1–0.9)
Monocytes: 14 %
Neutrophils Absolute: 2.9 10*3/uL (ref 1.4–7.0)
Neutrophils: 37 %
Platelets: 222 10*3/uL (ref 150–450)
RBC: 5.28 x10E6/uL (ref 4.14–5.80)
RDW: 12.2 % (ref 11.6–15.4)
WBC: 7.7 10*3/uL (ref 3.4–10.8)

## 2022-06-23 LAB — PSA: Prostate Specific Ag, Serum: <0.1 ng/mL (ref 0.0–4.0)

## 2022-06-23 LAB — LIPID PANEL W/O CHOL/HDL RATIO
Cholesterol, Total: 202 mg/dL — ABNORMAL HIGH (ref 100–199)
HDL: 41 mg/dL (ref >39)
LDL Chol Calc (NIH): 142 mg/dL — ABNORMAL HIGH (ref 0–99)
Triglycerides: 104 mg/dL (ref 0–149)
VLDL Cholesterol Cal: 19 mg/dL (ref 5–40)

## 2022-06-23 LAB — TSH: TSH: 1 u[IU]/mL (ref 0.450–4.500)

## 2022-07-19 DIAGNOSIS — H00016 Hordeolum externum left eye, unspecified eyelid: Secondary | ICD-10-CM | POA: Diagnosis not present

## 2022-08-12 ENCOUNTER — Ambulatory Visit: Payer: Medicare Other | Admitting: Family Medicine

## 2022-08-30 DIAGNOSIS — H00016 Hordeolum externum left eye, unspecified eyelid: Secondary | ICD-10-CM | POA: Diagnosis not present

## 2022-09-09 DIAGNOSIS — H57813 Brow ptosis, bilateral: Secondary | ICD-10-CM | POA: Diagnosis not present

## 2022-09-09 DIAGNOSIS — H02834 Dermatochalasis of left upper eyelid: Secondary | ICD-10-CM | POA: Diagnosis not present

## 2022-09-09 DIAGNOSIS — D485 Neoplasm of uncertain behavior of skin: Secondary | ICD-10-CM | POA: Diagnosis not present

## 2022-09-09 DIAGNOSIS — H02831 Dermatochalasis of right upper eyelid: Secondary | ICD-10-CM | POA: Diagnosis not present

## 2022-09-09 DIAGNOSIS — H0279 Other degenerative disorders of eyelid and periocular area: Secondary | ICD-10-CM | POA: Diagnosis not present

## 2022-09-09 DIAGNOSIS — L718 Other rosacea: Secondary | ICD-10-CM | POA: Diagnosis not present

## 2022-09-23 DIAGNOSIS — L720 Epidermal cyst: Secondary | ICD-10-CM | POA: Diagnosis not present

## 2022-09-23 DIAGNOSIS — D485 Neoplasm of uncertain behavior of skin: Secondary | ICD-10-CM | POA: Diagnosis not present

## 2022-09-23 DIAGNOSIS — H04562 Stenosis of left lacrimal punctum: Secondary | ICD-10-CM | POA: Diagnosis not present

## 2022-11-14 ENCOUNTER — Ambulatory Visit: Payer: Medicare Other | Admitting: Certified Registered"

## 2022-11-14 ENCOUNTER — Encounter
Admission: RE | Disposition: A | Discharge status: Home / Self Care | Payer: Self-pay | Source: Ambulatory Visit | Attending: Gastroenterology

## 2022-11-14 ENCOUNTER — Ambulatory Visit
Admission: RE | Admit: 2022-11-14 | Discharge: 2022-11-14 | Disposition: A | Discharge status: Home / Self Care | Payer: Medicare Other | Source: Ambulatory Visit | Attending: Gastroenterology | Admitting: Gastroenterology

## 2022-11-14 DIAGNOSIS — K635 Polyp of colon: Secondary | ICD-10-CM | POA: Insufficient documentation

## 2022-11-14 DIAGNOSIS — Z8546 Personal history of malignant neoplasm of prostate: Secondary | ICD-10-CM | POA: Diagnosis not present

## 2022-11-14 DIAGNOSIS — Z860100 Personal history of colon polyps, unspecified: Secondary | ICD-10-CM

## 2022-11-14 DIAGNOSIS — D126 Benign neoplasm of colon, unspecified: Secondary | ICD-10-CM | POA: Diagnosis not present

## 2022-11-14 DIAGNOSIS — Z9079 Acquired absence of other genital organ(s): Secondary | ICD-10-CM | POA: Insufficient documentation

## 2022-11-14 DIAGNOSIS — K5730 Diverticulosis of large intestine without perforation or abscess without bleeding: Secondary | ICD-10-CM | POA: Insufficient documentation

## 2022-11-14 DIAGNOSIS — Z1211 Encounter for screening for malignant neoplasm of colon: Secondary | ICD-10-CM | POA: Insufficient documentation

## 2022-11-14 DIAGNOSIS — Z808 Family history of malignant neoplasm of other organs or systems: Secondary | ICD-10-CM | POA: Diagnosis not present

## 2022-11-14 DIAGNOSIS — Z8042 Family history of malignant neoplasm of prostate: Secondary | ICD-10-CM | POA: Diagnosis not present

## 2022-11-14 DIAGNOSIS — D128 Benign neoplasm of rectum: Secondary | ICD-10-CM | POA: Diagnosis not present

## 2022-11-14 HISTORY — PX: COLONOSCOPY WITH PROPOFOL: SHX5780

## 2022-11-14 SURGERY — COLONOSCOPY WITH PROPOFOL
Anesthesia: General

## 2022-11-14 MED ORDER — PROPOFOL 10 MG/ML IV BOLUS
INTRAVENOUS | Status: DC | PRN
  Administered 2022-11-14: 70 mg via INTRAVENOUS

## 2022-11-14 MED ORDER — PROPOFOL 500 MG/50ML IV EMUL
INTRAVENOUS | Status: DC | PRN
  Administered 2022-11-14: 150 ug/kg/min via INTRAVENOUS

## 2022-11-14 MED ORDER — SIMETHICONE 40 MG/0.6ML PO SUSP
ORAL | Status: DC | PRN
  Administered 2022-11-14: 240 mL

## 2022-11-14 MED ORDER — LIDOCAINE HCL (CARDIAC) PF 100 MG/5ML IV SOSY
PREFILLED_SYRINGE | INTRAVENOUS | Status: DC | PRN
  Administered 2022-11-14: 50 mg via INTRAVENOUS

## 2022-11-14 MED ORDER — SODIUM CHLORIDE 0.9 % IV SOLN: INTRAVENOUS | Status: DC

## 2022-11-14 MED ORDER — PROPOFOL 1000 MG/100ML IV EMUL
INTRAVENOUS | Status: AC
  Filled 2022-11-14: qty 100

## 2022-11-14 NOTE — Transfer of Care (Signed)
Immediate Anesthesia Transfer of Care Note  Patient: Elijah Taylor  Procedure(s) Performed: COLONOSCOPY WITH PROPOFOL  Patient Location: PACU  Anesthesia Type:General  Level of Consciousness: awake  Airway & Oxygen Therapy: Patient Spontanous Breathing  Post-op Assessment: Report given to RN and Post -op Vital signs reviewed and stable  Post vital signs: Reviewed and stable  Last Vitals:  Vitals Value Taken Time  BP 108/75 11/14/22 0820  Temp 36.4 C 11/14/22 0820  Pulse 103 11/14/22 0820  Resp 20 11/14/22 0820  SpO2 97 % 11/14/22 0820  Vitals shown include unvalidated device data.  Last Pain:  Vitals:   11/14/22 0820  TempSrc: Temporal  PainSc: 0-No pain         Complications: No notable events documented.

## 2022-11-14 NOTE — Anesthesia Procedure Notes (Signed)
Procedure Name: MAC Date/Time: 11/14/2022 8:01 AM  Performed by: Cheral Bay, CRNAPre-anesthesia Checklist: Patient identified, Emergency Drugs available, Suction available, Patient being monitored and Timeout performed Patient Re-evaluated:Patient Re-evaluated prior to induction Oxygen Delivery Method: Nasal cannula Induction Type: IV induction Placement Confirmation: positive ETCO2 and CO2 detector

## 2022-11-14 NOTE — Anesthesia Postprocedure Evaluation (Signed)
Anesthesia Post Note  Patient: Elijah Taylor  Procedure(s) Performed: COLONOSCOPY WITH PROPOFOL  Patient location during evaluation: Endoscopy Anesthesia Type: General Level of consciousness: awake and alert Pain management: pain level controlled Vital Signs Assessment: post-procedure vital signs reviewed and stable Respiratory status: spontaneous breathing, nonlabored ventilation, respiratory function stable and patient connected to nasal cannula oxygen Cardiovascular status: blood pressure returned to baseline and stable Postop Assessment: no apparent nausea or vomiting Anesthetic complications: no   No notable events documented.   Last Vitals:  Vitals:   11/14/22 0830 11/14/22 0840  BP: (!) 123/93 131/83  Pulse: (!) 101 95  Resp: (!) 26 19  Temp:    SpO2: 97% 99%    Last Pain:  Vitals:   11/14/22 0840  TempSrc:   PainSc: 0-No pain                 Louie Boston

## 2022-11-14 NOTE — H&P (Signed)
[name], MD [address], [place], [place], [address] [address], [place], [place], [address] Phone: [phone]  Fax: [phone]  Primary Care Physician:  Dorcas Carrow, DO   Pre-Procedure History & Physical: HPI:  Elijah Taylor is a 72 y.o. male is here for an colonoscopy.   Past Medical History:  Diagnosis Date   Arthritis    History of kidney stones    Insomnia    Pneumonia    Prostate cancer (HCC)    Urinary bladder incontinence    Related to Prostectomy   Wears glasses     Past Surgical History:  Procedure Laterality Date   ANTERIOR CERVICAL DECOMP/DISCECTOMY FUSION N/A 05/01/2020   Procedure: Cervical Four-Five Anterior Cervical Decompression/Discectomy/Fusion with removal of Cervical Three-Four Hardware;  Surgeon: Tia Alert, MD;  Location: [facility] OR;  Service: Neurosurgery;  Laterality: N/A;   CARPAL TUNNEL RELEASE     left   CARPAL TUNNEL RELEASE Right 12/01/2014   Procedure: RIGHT CARPAL TUNNEL RELEASE;  Surgeon: Cindee Salt, MD;  Location: [facility];  Service: Orthopedics;  Laterality: Right;   CARPAL TUNNEL RELEASE Left 01/11/2022   Procedure: LEFT CARPAL TUNNEL RELEASE;  Surgeon: Betha Loa, MD;  Location: [facility];  Service: Orthopedics;  Laterality: Left;   CATARACT EXTRACTION W/ INTRAOCULAR LENS IMPLANT Right    CATARACT EXTRACTION W/ INTRAOCULAR LENS IMPLANT Right    CERVICAL FUSION  2007   COLONOSCOPY     COLONOSCOPY WITH PROPOFOL N/A 11/07/2017   Procedure: COLONOSCOPY WITH PROPOFOL;  Surgeon: [name], MD;  Location: [facility] END[facility]COPY;  Service: Gastroenterology;  Laterality: N/A;   CYSTOSCOPY MACROPLASTIQUE IMPLANT N/A 09/15/2015   Procedure: CYSTOSCOPY MACROPLASTIQUE IMPLANT;  Surgeon: Orson Ape, MD;  Location: [facility] ORS;  Service: Urology;  Laterality: N/A;   CYSTOSCOPY/URETEROSCOPY/HOLMIUM LASER/STENT PLACEMENT Left 06/29/2018   Procedure: CYSTOSCOPY/URETEROSCOPY/HOLMIUM  LASER/STENT PLACEMENT;  Surgeon: Sondra Come, MD;  Location: [facility] ORS;  Service: Urology;  Laterality: Left;   EXTRACORPOREAL SHOCK WAVE LITHOTRIPSY Left 07/12/2018   Procedure: EXTRACORPOREAL SHOCK WAVE LITHOTRIPSY (ESWL);  Surgeon: Sondra Come, MD;  Location: [facility] ORS;  Service: Urology;  Laterality: Left;   EXTRACORPOREAL SHOCK WAVE LITHOTRIPSY Right 10/18/2018   Procedure: EXTRACORPOREAL SHOCK WAVE LITHOTRIPSY (ESWL);  Surgeon: Orson Ape, MD;  Location: [facility] ORS;  Service: Urology;  Laterality: Right;   RETROPUBIC PROSTATECTOMY  2010   SHOULDER ARTHROSCOPY W/ ROTATOR CUFF REPAIR     right and left   TRIGGER FINGER RELEASE     x 4   TRIGGER FINGER RELEASE Left 09/02/2014   Procedure: RELEASE A-1 PULLEY LEFT INDEX FINGER;  Surgeon: Cindee Salt, MD;  Location: [facility];  Service: Orthopedics;  Laterality: Left;  ANESTHESIA: IV REGIONAL FAB   TRIGGER FINGER RELEASE Right 05/16/2017   Procedure: RELEASE TRIGGER FINGER/A-1 PULLEY RIGHT MIDDLE FINGER;  Surgeon: Cindee Salt, MD;  Location: [facility];  Service: Orthopedics;  Laterality: Right;   TRIGGER FINGER RELEASE Right 03/28/2019   Procedure: RELEASE TRIGGER FINGER/A-1 PULLEY RIGHT INDEX;  Surgeon: Cindee Salt, MD;  Location: [facility];  Service: Orthopedics;  Laterality: Right;  FAB    Prior to Admission medications   Medication Sig Start Date End Date Taking? Authorizing Provider  aspirin EC 81 MG tablet Take 81 mg by mouth daily with supper.    [provider]  diphenhydrAMINE HCl (ZZZQUIL) 50 MG/30ML LIQD Take 25 mg by mouth at bedtime.  [provider]  diphenhydramine-acetaminophen (TYLENOL PM) 25-500 MG TABS tablet Take 1 tablet by mouth at bedtime.    [provider]  Multiple Vitamins-Minerals (MULTIVITAMIN WITH MINERALS) tablet Take 1 tablet by mouth daily with supper.    [provider]  triazolam (HALCION) 0.125 MG tablet Take 0.25 mg  by mouth at bedtime as needed for sleep.    [provider]    Allergies as of 06/22/2022   (No Known Allergies)    Family History  Problem Relation Age of Onset   Cancer Mother        Brain Tumor   Kidney failure Father    Prostate cancer Father    Heart disease Brother    Heart attack Maternal Grandfather    Prostate cancer Maternal Uncle    Prostate cancer Maternal Uncle     Social History   Socioeconomic History   Marital status: Married    Spouse name: Not on file   Number of children: 2   Years of education: Not on file   Highest education level: Not on file  Occupational History   Not on file  Tobacco Use   Smoking status: Never   Smokeless tobacco: Never  Vaping Use   Vaping Use: Never used  Substance and Sexual Activity   Alcohol use: Yes    Alcohol/week: 1.0 standard drink of alcohol    Types: 1 Glasses of wine per week    Comment: occasionally   Drug use: No   Sexual activity: Not Currently  Other Topics Concern   Not on file  Social History Narrative   Lives with wife; in [place]; retd. mechanic maintenance; no smoking; on alcohol    Social Determinants of Health   Financial Resource Strain: Not on file  Food Insecurity: Not on file  Transportation Needs: Not on file  Physical Activity: Not on file  Stress: Not on file  Social Connections: Not on file  Intimate Partner Violence: Not on file    Review of Systems: See HPI, otherwise negative ROS  Physical Exam: BP (!) 152/96   Pulse (!) 118   Temp (!) 97 F (36.1 C) (Temporal)   Resp 18   Ht 5\' 9"  (1.753 m)   Wt 82.1 kg   SpO2 96%   BMI 26.73 kg/m  General:   Alert,  pleasant and cooperative in NAD Head:  Normocephalic and atraumatic. Neck:  Supple; no masses or thyromegaly. Lungs:  Clear throughout to auscultation, normal respiratory effort.    Heart:  +S1, +S2, Regular rate and rhythm, No edema. Abdomen:  Soft, nontender and nondistended. Normal bowel sounds, without  guarding, and without rebound.   Neurologic:  Alert and  oriented x4;  grossly normal neurologically.  Impression/Plan: [name] is here for an colonoscopy to be performed for surveillance due to prior history of colon polyps   Risks, benefits, limitations, and alternatives regarding  colonoscopy have been reviewed with the patient.  Questions have been answered.  All parties agreeable.   [name], MD  11/14/2022, 7:55 AM

## 2022-11-14 NOTE — Op Note (Signed)
[facility] Patient Name: Elijah Taylor Procedure Date: 11/14/2022 7:12 AM MRN: [id number] Account #: [id number] Date of Birth: 1950/09/28 Admit Type: Outpatient Age: 72 Room: [facility] ENDO ROOM 1 Gender: Male Note Status: Finalized Instrument Name: Prentice Docker [id number] Procedure:             Colonoscopy Indications:           Surveillance: Personal history of adenomatous polyps                         on last colonoscopy 5 years ago Providers:             [name] MD, MD Medicines:             Monitored Anesthesia Care Complications:         No immediate complications. Procedure:             Pre-Anesthesia Assessment:                        - Prior to the procedure, a History and Physical was                         performed, and patient medications, allergies and                         sensitivities were reviewed. The patient's tolerance                         of previous anesthesia was reviewed.                        - The risks and benefits of the procedure and the                         sedation options and risks were discussed with the                         patient. All questions were answered and informed                         consent was obtained.                        - ASA Grade Assessment: II - A patient with mild                         systemic disease.                        After obtaining informed consent, the colonoscope was                         passed under direct vision. Throughout the procedure,                         the patient's blood pressure, pulse, and oxygen                         saturations were monitored continuously. The  Colonoscope was introduced through the anus and                         advanced to the the cecum, identified by the                         appendiceal orifice. The colonoscopy was performed                         with ease. The patient tolerated the procedure  well.                         The quality of the bowel preparation was good. The                         ileocecal valve, appendiceal orifice, and rectum were                         photographed. Findings:      The perianal and digital rectal examinations were normal.      Two sessile polyps were found in the transverse colon and ascending       colon. The polyps were 3 to 4 mm in size. These polyps were removed with       a cold biopsy forceps. Resection and retrieval were complete.      A 20 mm polyp was found in the distal rectum. The polyp was lateral       spreading. Biopsies were taken with a cold forceps for histology. Polyp       extended to anal verge      Multiple large-mouthed diverticula were found in the sigmoid colon. Impression:            - Two 3 to 4 mm polyps in the transverse colon and in                         the ascending colon, removed with a cold biopsy                         forceps. Resected and retrieved.                        - One 20 mm polyp in the distal rectum. Biopsied.                        - Diverticulosis in the sigmoid colon.                        - The examination was otherwise normal on direct and                         retroflexion views. Recommendation:        - Discharge patient to home (ambulatory).                        - Resume previous diet.                        - Continue present medications.                        -  Await pathology results.                        - Repeat colonoscopy for surveillance based on                         pathology results. Procedure Code(s):     --- Professional ---                        [id number], Colonoscopy, flexible; with biopsy, single or                         multiple Diagnosis Code(s):     --- Professional ---                        Z86.010, Personal history of colonic polyps                        D12.3, Benign neoplasm of transverse colon (hepatic                         flexure or splenic  flexure)                        D12.8, Benign neoplasm of rectum                        D12.2, Benign neoplasm of ascending colon                        K57.30, Diverticulosis of large intestine without                         perforation or abscess without bleeding CPT copyright 2022 American Medical Association. All rights reserved. The codes documented in this report are preliminary and upon coder review may  be revised to meet current compliance requirements. Wyline MoodKiran April Carlyon, MD Wyline MoodKiran Ellenore Roscoe MD, MD 11/14/2022 8:20:01 AM This report has been signed electronically. Number of Addenda: 0 Note Initiated On: 11/14/2022 7:12 AM Scope Withdrawal Time: 0 hours 12 minutes 10 seconds  Total Procedure Duration: 0 hours 15 minutes 14 seconds  Estimated Blood Loss:  Estimated blood loss: none.      [facility]

## 2022-11-14 NOTE — Anesthesia Preprocedure Evaluation (Addendum)
Anesthesia Evaluation  Patient identified by MRN, date of birth, ID band Patient awake    Reviewed: Allergy & Precautions, NPO status , Patient's Chart, lab work & pertinent test results  History of Anesthesia Complications Negative for: history of anesthetic complications  Airway Mallampati: II  TM Distance: >3 FB Neck ROM: full    Dental no notable dental hx.    Pulmonary neg pulmonary ROS   Pulmonary exam normal        Cardiovascular negative cardio ROS Normal cardiovascular exam     Neuro/Psych negative neurological ROS  negative psych ROS   GI/Hepatic negative GI ROS, Neg liver ROS,,,  Endo/Other  negative endocrine ROS    Renal/GU negative Renal ROS Bladder dysfunction      Musculoskeletal  (+) Arthritis ,    Abdominal   Peds  Hematology negative hematology ROS (+)   Anesthesia Other Findings Past Medical History: No date: Arthritis No date: History of kidney stones No date: Insomnia No date: Pneumonia No date: Prostate cancer (HCC) No date: Urinary bladder incontinence     Comment:  Related to Prostectomy No date: Wears glasses  Past Surgical History: 05/01/2020: ANTERIOR CERVICAL DECOMP/DISCECTOMY FUSION; N/A     Comment:  Procedure: Cervical Four-Five Anterior Cervical               Decompression/Discectomy/Fusion with removal of Cervical               Three-Four Hardware;  Surgeon: Tia Alert, MD;                Location: [facility] OR;  Service: Neurosurgery;  Laterality:               N/A; No date: CARPAL TUNNEL RELEASE     Comment:  left 12/01/2014: CARPAL TUNNEL RELEASE; Right     Comment:  Procedure: RIGHT CARPAL TUNNEL RELEASE;  Surgeon: Cindee Salt, MD;  Location: [facility];                Service: Orthopedics;  Laterality: Right; 01/11/2022: CARPAL TUNNEL RELEASE; Left     Comment:  Procedure: LEFT CARPAL TUNNEL RELEASE;  Surgeon: Betha Loa,  MD;  Location:  SURGERY CENTER;                Service: Orthopedics;  Laterality: Left; No date: CATARACT EXTRACTION W/ INTRAOCULAR LENS IMPLANT; Right No date: CATARACT EXTRACTION W/ INTRAOCULAR LENS IMPLANT; Right 2007: CERVICAL FUSION No date: COLONOSCOPY 11/07/2017: COLONOSCOPY WITH PROPOFOL; N/A     Comment:  Procedure: COLONOSCOPY WITH PROPOFOL;  Surgeon: [name], MD;  Location: [facility] END[facility]COPY;  Service:               Gastroenterology;  Laterality: N/A; 09/15/2015: CYSTOSCOPY MACROPLASTIQUE IMPLANT; N/A     Comment:  Procedure: CYSTOSCOPY MACROPLASTIQUE IMPLANT;  Surgeon:               Orson Ape, MD;  Location: [facility] ORS;  Service:               Urology;  Laterality: N/A; 06/29/2018: CYSTOSCOPY/URETEROSCOPY/HOLMIUM LASER/STENT PLACEMENT;  Left     Comment:  Procedure: CYSTOSCOPY/URETEROSCOPY/HOLMIUM LASER/STENT               PLACEMENT;  Surgeon: Sondra Come, MD;  Location:               [facility] ORS;  Service: Urology;  Laterality: Left; 07/12/2018: EXTRACORPOREAL SHOCK WAVE LITHOTRIPSY; Left     Comment:  Procedure: EXTRACORPOREAL SHOCK WAVE LITHOTRIPSY (ESWL);              Surgeon: Sondra Come, MD;  Location: [facility] ORS;                Service: Urology;  Laterality: Left; 10/18/2018: EXTRACORPOREAL SHOCK WAVE LITHOTRIPSY; Right     Comment:  Procedure: EXTRACORPOREAL SHOCK WAVE LITHOTRIPSY (ESWL);              Surgeon: Orson Ape, MD;  Location: [facility] ORS;                Service: Urology;  Laterality: Right; 2010: RETROPUBIC PROSTATECTOMY No date: SHOULDER ARTHROSCOPY W/ ROTATOR CUFF REPAIR     Comment:  right and left No date: TRIGGER FINGER RELEASE     Comment:  x 4 09/02/2014: TRIGGER FINGER RELEASE; Left     Comment:  Procedure: RELEASE A-1 PULLEY LEFT INDEX FINGER;                Surgeon: Cindee Salt, MD;  Location: [facility];  Service: Orthopedics;  Laterality: Left;                ANESTHESIA: IV  REGIONAL FAB 05/16/2017: TRIGGER FINGER RELEASE; Right     Comment:  Procedure: RELEASE TRIGGER FINGER/A-1 PULLEY RIGHT               MIDDLE FINGER;  Surgeon: Cindee Salt, MD;  Location:               [facility];  Service: Orthopedics;                Laterality: Right; 03/28/2019: TRIGGER FINGER RELEASE; Right     Comment:  Procedure: RELEASE TRIGGER FINGER/A-1 PULLEY RIGHT               INDEX;  Surgeon: Cindee Salt, MD;  Location: [facility];  Service: Orthopedics;  Laterality:               Right;  FAB     Reproductive/Obstetrics negative OB ROS                             Anesthesia Physical Anesthesia Plan  ASA: 2  Anesthesia Plan: General   Post-op Pain Management: Minimal or no pain anticipated   Induction: Intravenous  PONV Risk Score and Plan: 1 and Propofol infusion and TIVA  Airway Management Planned: Natural Airway and Nasal Cannula  Additional Equipment:   Intra-op Plan:   Post-operative Plan:   Informed Consent: I have reviewed the patients History and Physical, chart, labs and discussed the procedure including the risks, benefits and alternatives for the proposed anesthesia with the patient or authorized representative who has indicated his/her understanding and acceptance.     Dental Advisory Given  Plan Discussed with: Anesthesiologist, CRNA and Surgeon  Anesthesia Plan Comments: (Patient consented for risks of anesthesia including but not limited to:  - adverse reactions to medications - risk of airway placement if required -  damage to eyes, teeth, lips or other oral mucosa - nerve damage due to positioning  - sore throat or hoarseness - Damage to heart, brain, nerves, lungs, other parts of body or loss of life  Patient voiced understanding.)       Anesthesia Quick Evaluation

## 2022-11-15 ENCOUNTER — Encounter: Payer: Self-pay | Admitting: Gastroenterology

## 2022-11-15 ENCOUNTER — Telehealth: Payer: Self-pay

## 2022-11-15 LAB — SURGICAL PATHOLOGY

## 2022-11-15 NOTE — Telephone Encounter (Signed)
Elijah Taylor inform that the lesion I took a bx on his rectum shows abnormal type of cells, Proceed with my plan in his procedure note to refer to colorectal surgery at [facility] or [facility] or [facility].  Called patient to let him know what his pathology report result were and he understood. I also let him know that I would be sending a referral to the colorectal surgeon for removal of his polyps on his rectum.  Referral sen to [facility] and [facility].

## 2022-11-15 NOTE — Progress Notes (Signed)
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 Follow up video visit in 4 mionths just to ensure he has been taken care of   Dr [name] MD,MRCP [facility]) Gastroenterology/Hepatology Pager: [phone]

## 2022-11-17 NOTE — Telephone Encounter (Signed)
[facility] called me to let me know that they were going to call patient to schedule him an appointment.

## 2022-12-13 DIAGNOSIS — K6282 Dysplasia of anus: Secondary | ICD-10-CM | POA: Diagnosis not present

## 2022-12-14 ENCOUNTER — Encounter: Payer: Self-pay | Admitting: Gastroenterology

## 2022-12-15 NOTE — Progress Notes (Signed)
Surgery orders requested via Epic inbox. °

## 2022-12-19 ENCOUNTER — Ambulatory Visit: Payer: Self-pay | Admitting: Surgery

## 2022-12-19 DIAGNOSIS — Z01818 Encounter for other preprocedural examination: Secondary | ICD-10-CM

## 2022-12-19 NOTE — Patient Instructions (Signed)
SURGICAL WAITING ROOM VISITATION  Patients having surgery or a procedure may have no more than 2 support people in the waiting area - these visitors may rotate.    Children under the age of 59 must have an adult with them who is not the patient.  Due to an increase in RSV and influenza rates and associated hospitalizations, children ages 56 and under may not visit patients in [facility] hospitals.  If the patient needs to stay at the hospital during part of their recovery, the visitor guidelines for inpatient rooms apply. Pre-op nurse will coordinate an appropriate time for 1 support person to accompany patient in pre-op.  This support person may not rotate.    Please refer to the [facility] website for the visitor guidelines for Inpatients (after your surgery is over and you are in a regular room).       Your procedure is scheduled on:  12/26/2022    Report to [facility] Main Entrance    Report to admitting at   1230 pm    Call this number if you have problems the morning of surgery [phone]   Do not eat food  or drink liquids   :After Midnight.              Follow rectal prep  Fleets enema nite before and am of surgery .             Fleets enema nite bewfore and am of siurgery.                  If you have questions, please contact your surgeon's office.   FOLLOW BOWEL PREP AND ANY ADDITIONAL PRE OP INSTRUCTIONS YOU RECEIVED FROM YOUR SURGEON'S OFFICE!!!     Oral Hygiene is also important to reduce your risk of infection.                                    Remember - BRUSH YOUR TEETH THE MORNING OF SURGERY WITH YOUR REGULAR TOOTHPASTE  DENTURES WILL BE REMOVED PRIOR TO SURGERY PLEASE DO NOT APPLY "Poly grip" OR ADHESIVES!!!   Do NOT smoke after Midnight   Take these medicines the morning of surgery with A SIP OF WATER:  none   DO NOT TAKE ANY ORAL DIABETIC MEDICATIONS DAY OF YOUR SURGERY  Bring CPAP mask and tubing day of surgery.                               You may not have any metal on your body including hair pins, jewelry, and body piercing             Do not wear make-up, lotions, powders, perfumes/cologne, or deodorant  Do not wear nail polish including gel and S&S, artificial/acrylic nails, or any other type of covering on natural nails including finger and toenails. If you have artificial nails, gel coating, etc. that needs to be removed by a nail salon please have this removed prior to surgery or surgery may need to be canceled/ delayed if the surgeon/ anesthesia feels like they are unable to be safely monitored.   Do not shave  48 hours prior to surgery.               Men may shave face and neck.   Do not bring valuables to the hospital. [facility] IS NOT             RESPONSIBLE   FOR VALUABLES.   Contacts, glasses, dentures or bridgework may not be worn into surgery.   Bring small overnight bag day of surgery.   DO NOT BRING YOUR HOME MEDICATIONS TO THE HOSPITAL. PHARMACY WILL DISPENSE MEDICATIONS LISTED ON YOUR MEDICATION LIST TO YOU DURING YOUR ADMISSION IN THE HOSPITAL!    Patients discharged on the day of surgery will not be allowed to drive home.  Someone NEEDS to stay with you for the first 24 hours after anesthesia.   Special Instructions: Bring a copy of your healthcare power of attorney and living will documents the day of surgery if you haven't scanned them before.              Please read over the following fact sheets you were given: IF YOU HAVE QUESTIONS ABOUT YOUR PRE-OP INSTRUCTIONS PLEASE CALL [phone]   If you received a COVID test during your pre-op visit  it is requested that you wear a mask when out in public, stay away from anyone that may not be feeling well and notify your surgeon if you develop symptoms. If you test positive for Covid or have been in contact with anyone that has tested positive in the last 10 days please notify you surgeon.    [place] - Preparing for Surgery Before  surgery, you can play an important role.  Because skin is not sterile, your skin needs to be as free of germs as possible.  You can reduce the number of germs on your skin by washing with CHG (chlorahexidine gluconate) soap before surgery.  CHG is an antiseptic cleaner which kills germs and bonds with the skin to continue killing germs even after washing. Please DO NOT use if you have an allergy to CHG or antibacterial soaps.  If your skin becomes reddened/irritated stop using the CHG and inform your nurse when you arrive at Short Stay. Do not shave (including legs and underarms) for at least 48 hours prior to the first CHG shower.  You may shave your face/neck. Please follow these instructions carefully:  1.  Shower with CHG Soap the night before surgery and the  morning of Surgery.  2.  If you choose to wash your hair, wash your hair first as usual with your  normal  shampoo.  3.  After you shampoo, rinse your hair and body thoroughly to remove the  shampoo.                           4.  Use CHG as you would any other liquid soap.  You can apply chg directly  to the skin and wash                       Gently with a scrungie or clean washcloth.  5.  Apply the CHG Soap to your body ONLY FROM THE NECK DOWN.   Do not use on face/ open                           Wound or open sores. Avoid contact with eyes, ears mouth and genitals (private parts).                       Wash face,  Genitals (private parts) with your normal soap.  6.  Wash thoroughly, paying special attention to the area where your surgery  will be performed.  7.  Thoroughly rinse your body with warm water from the neck down.  8.  DO NOT shower/wash with your normal soap after using and rinsing off  the CHG Soap.                9.  Pat yourself dry with a clean towel.            10.  Wear clean pajamas.            11.  Place clean sheets on your bed the night of your first shower and do not  sleep with pets. Day of Surgery  : Do not apply any lotions/deodorants the morning of surgery.  Please wear clean clothes to the hospital/surgery center.  FAILURE TO FOLLOW THESE INSTRUCTIONS MAY RESULT IN THE CANCELLATION OF YOUR SURGERY PATIENT SIGNATURE_________________________________  NURSE SIGNATURE__________________________________  ________________________________________________________________________

## 2022-12-19 NOTE — Progress Notes (Signed)
Anesthesia Review:  ZOX:WRUEA Johnson  Cardiologist : none  Chest x-ray : EKG : Echo : Stress test: Cardiac Cath :  Activity level: can do a flight of stairs without difficulty  Sleep Study/ CPAP : none  Fasting Blood Sugar :      / Checks Blood Sugar -- times a day:   Blood Thinner/ Instructions /Last Dose: ASA / Instructions/ Last Dose :    81 mg aspirin   Pt stated he was told at MD office fleets enema would be done here at hospital on DOS.  Orders read as if to be done at home .  Called [facility] and spoke with Triage.  They are to call DR Cliffton Asters and call preop nurse back with clarification then preop nurse to call pt.  Pt aware at time of preop appt.     Pt has poison oak on bilateral arms at time of preop appt.   Paged Inf Control at time of preop appt with no return call.   LVMM for [facility] on Triage LIne in regards to this.  Gave call back number of [phone] Kathie Rhodes from Triage at [facility] called preop nurse back and stated per DR [facility] assistant at office that pt did not need to do any fleets enema prior to surgery on 12/26/2022. Preop nurse called pt and informed pt .  Pt voiced understanding.  Triage called back from [facility] and per DR White from his standpoint it is okay for pt to have surgery.

## 2022-12-19 NOTE — Progress Notes (Signed)
Second request for orders in [facility] spoke with Toniann Fail at [facility].

## 2022-12-21 ENCOUNTER — Encounter (HOSPITAL_COMMUNITY)
Admission: RE | Admit: 2022-12-21 | Discharge: 2022-12-21 | Disposition: A | Discharge status: Home / Self Care | Payer: Medicare Other | Source: Ambulatory Visit | Attending: Surgery | Admitting: Surgery

## 2022-12-21 ENCOUNTER — Encounter (HOSPITAL_COMMUNITY): Payer: Self-pay

## 2022-12-21 ENCOUNTER — Other Ambulatory Visit: Payer: Self-pay

## 2022-12-21 DIAGNOSIS — Z01812 Encounter for preprocedural laboratory examination: Secondary | ICD-10-CM | POA: Insufficient documentation

## 2022-12-21 DIAGNOSIS — Z01818 Encounter for other preprocedural examination: Secondary | ICD-10-CM

## 2022-12-21 LAB — BASIC METABOLIC PANEL
Anion gap: 8 (ref 5–15)
BUN: 17 mg/dL (ref 8–23)
CO2: 26 mmol/L (ref 22–32)
Calcium: 9.5 mg/dL (ref 8.9–10.3)
Chloride: 106 mmol/L (ref 98–111)
Creatinine, Ser: 1.17 mg/dL (ref 0.61–1.24)
GFR, Estimated: >60 mL/min (ref >60)
Glucose, Bld: 92 mg/dL (ref 70–99)
Potassium: 4.9 mmol/L (ref 3.5–5.1)
Sodium: 140 mmol/L (ref 135–145)

## 2022-12-21 LAB — CBC WITH DIFFERENTIAL/PLATELET
Abs Immature Granulocytes: 0.02 10*3/uL (ref 0.00–0.07)
Basophils Absolute: 0.1 10*3/uL (ref 0.0–0.1)
Basophils Relative: 1 %
Eosinophils Absolute: 0.3 10*3/uL (ref 0.0–0.5)
Eosinophils Relative: 4 %
HCT: 48 % (ref 39.0–52.0)
Hemoglobin: 15.7 g/dL (ref 13.0–17.0)
Immature Granulocytes: 0 %
Lymphocytes Relative: 32 %
Lymphs Abs: 2.5 10*3/uL (ref 0.7–4.0)
MCH: 31.7 pg (ref 26.0–34.0)
MCHC: 32.7 g/dL (ref 30.0–36.0)
MCV: 96.8 fL (ref 80.0–100.0)
Monocytes Absolute: 1 10*3/uL (ref 0.1–1.0)
Monocytes Relative: 12 %
Neutro Abs: 3.9 10*3/uL (ref 1.7–7.7)
Neutrophils Relative %: 51 %
Platelets: 228 10*3/uL (ref 150–400)
RBC: 4.96 MIL/uL (ref 4.22–5.81)
RDW: 12.9 % (ref 11.5–15.5)
WBC: 7.8 10*3/uL (ref 4.0–10.5)
nRBC: 0 % (ref 0.0–0.2)

## 2022-12-26 ENCOUNTER — Ambulatory Visit (HOSPITAL_COMMUNITY)
Admission: RE | Admit: 2022-12-26 | Discharge: 2022-12-26 | Disposition: A | Discharge status: Home / Self Care | Payer: Medicare Other | Attending: Surgery | Admitting: Surgery

## 2022-12-26 ENCOUNTER — Ambulatory Visit (HOSPITAL_COMMUNITY): Payer: Medicare Other | Admitting: Medical

## 2022-12-26 ENCOUNTER — Encounter (HOSPITAL_COMMUNITY): Payer: Self-pay | Admitting: Surgery

## 2022-12-26 ENCOUNTER — Other Ambulatory Visit: Payer: Self-pay

## 2022-12-26 ENCOUNTER — Ambulatory Visit (HOSPITAL_BASED_OUTPATIENT_CLINIC_OR_DEPARTMENT_OTHER): Payer: Medicare Other | Admitting: Certified Registered Nurse Anesthetist

## 2022-12-26 ENCOUNTER — Encounter (HOSPITAL_COMMUNITY)
Admission: RE | Disposition: A | Discharge status: Home / Self Care | Payer: Self-pay | Source: Home / Self Care | Attending: Surgery

## 2022-12-26 DIAGNOSIS — K6282 Dysplasia of anus: Secondary | ICD-10-CM | POA: Diagnosis not present

## 2022-12-26 DIAGNOSIS — Z8546 Personal history of malignant neoplasm of prostate: Secondary | ICD-10-CM | POA: Diagnosis not present

## 2022-12-26 DIAGNOSIS — R32 Unspecified urinary incontinence: Secondary | ICD-10-CM | POA: Insufficient documentation

## 2022-12-26 DIAGNOSIS — A630 Anogenital (venereal) warts: Secondary | ICD-10-CM | POA: Insufficient documentation

## 2022-12-26 DIAGNOSIS — N319 Neuromuscular dysfunction of bladder, unspecified: Secondary | ICD-10-CM

## 2022-12-26 DIAGNOSIS — G4700 Insomnia, unspecified: Secondary | ICD-10-CM | POA: Diagnosis not present

## 2022-12-26 DIAGNOSIS — Z87442 Personal history of urinary calculi: Secondary | ICD-10-CM | POA: Insufficient documentation

## 2022-12-26 DIAGNOSIS — K6289 Other specified diseases of anus and rectum: Secondary | ICD-10-CM | POA: Diagnosis not present

## 2022-12-26 DIAGNOSIS — K621 Rectal polyp: Secondary | ICD-10-CM | POA: Diagnosis not present

## 2022-12-26 HISTORY — PX: RECTAL EXAM UNDER ANESTHESIA: SHX6399

## 2022-12-26 HISTORY — PX: TRANSANAL EXCISION OF RECTAL MASS: SHX6134

## 2022-12-26 SURGERY — EXCISION, MASS, RECTUM, ANAL APPROACH
Anesthesia: General

## 2022-12-26 MED ORDER — BUPIVACAINE LIPOSOME 1.3 % IJ SUSP: 20.0000 mL | Freq: Once | INTRAMUSCULAR | Status: DC

## 2022-12-26 MED ORDER — HYDROMORPHONE HCL 1 MG/ML IJ SOLN
INTRAMUSCULAR | Status: AC
  Administered 2022-12-26: 0.5 mg via INTRAVENOUS
  Filled 2022-12-26: qty 1

## 2022-12-26 MED ORDER — BUPIVACAINE HCL (PF) 0.25 % IJ SOLN
INTRAMUSCULAR | Status: AC
  Filled 2022-12-26: qty 30

## 2022-12-26 MED ORDER — DEXAMETHASONE SODIUM PHOSPHATE 10 MG/ML IJ SOLN
INTRAMUSCULAR | Status: AC
  Filled 2022-12-26: qty 1

## 2022-12-26 MED ORDER — HYDROMORPHONE HCL 1 MG/ML IJ SOLN
0.2500 mg | INTRAMUSCULAR | Status: DC | PRN
  Administered 2022-12-26: 0.5 mg via INTRAVENOUS

## 2022-12-26 MED ORDER — 0.9 % SODIUM CHLORIDE (POUR BTL) OPTIME
TOPICAL | Status: DC | PRN
  Administered 2022-12-26: 1000 mL

## 2022-12-26 MED ORDER — PROPOFOL 10 MG/ML IV BOLUS
INTRAVENOUS | Status: DC | PRN
  Administered 2022-12-26: 40 mg via INTRAVENOUS
  Administered 2022-12-26: 120 mg via INTRAVENOUS

## 2022-12-26 MED ORDER — MEPERIDINE HCL 50 MG/ML IJ SOLN
INTRAMUSCULAR | Status: AC
  Filled 2022-12-26: qty 1

## 2022-12-26 MED ORDER — SUGAMMADEX SODIUM 200 MG/2ML IV SOLN
INTRAVENOUS | Status: DC | PRN
  Administered 2022-12-26: 200 mg via INTRAVENOUS

## 2022-12-26 MED ORDER — SODIUM CHLORIDE 0.9 % IV SOLN
2.0000 g | INTRAVENOUS | Status: AC
  Administered 2022-12-26: 2 g via INTRAVENOUS
  Filled 2022-12-26: qty 2

## 2022-12-26 MED ORDER — AMISULPRIDE (ANTIEMETIC) 5 MG/2ML IV SOLN: 10.0000 mg | Freq: Once | INTRAVENOUS | Status: DC | PRN

## 2022-12-26 MED ORDER — BUPIVACAINE LIPOSOME 1.3 % IJ SUSP
INTRAMUSCULAR | Status: DC | PRN
  Administered 2022-12-26: 20 mL

## 2022-12-26 MED ORDER — FLEET ENEMA 7-19 GM/118ML RE ENEM
1.0000 | ENEMA | Freq: Once | RECTAL | Status: AC
  Administered 2022-12-26: 1 via RECTAL
  Filled 2022-12-26: qty 1

## 2022-12-26 MED ORDER — PROPOFOL 10 MG/ML IV BOLUS
INTRAVENOUS | Status: AC
  Filled 2022-12-26: qty 20

## 2022-12-26 MED ORDER — FENTANYL CITRATE (PF) 100 MCG/2ML IJ SOLN
INTRAMUSCULAR | Status: AC
  Filled 2022-12-26: qty 2

## 2022-12-26 MED ORDER — ACETAMINOPHEN 500 MG PO TABS
1000.0000 mg | ORAL_TABLET | ORAL | Status: AC
  Administered 2022-12-26: 1000 mg via ORAL
  Filled 2022-12-26: qty 2

## 2022-12-26 MED ORDER — FLEET ENEMA 7-19 GM/118ML RE ENEM
1.0000 | ENEMA | Freq: Once | RECTAL | Status: AC
  Filled 2022-12-26: qty 1

## 2022-12-26 MED ORDER — ONDANSETRON HCL 4 MG/2ML IJ SOLN
INTRAMUSCULAR | Status: AC
  Filled 2022-12-26: qty 2

## 2022-12-26 MED ORDER — DEXAMETHASONE SODIUM PHOSPHATE 10 MG/ML IJ SOLN
INTRAMUSCULAR | Status: DC | PRN
  Administered 2022-12-26: 10 mg via INTRAVENOUS

## 2022-12-26 MED ORDER — BUPIVACAINE LIPOSOME 1.3 % IJ SUSP
INTRAMUSCULAR | Status: AC
  Filled 2022-12-26: qty 20

## 2022-12-26 MED ORDER — ONDANSETRON HCL 4 MG/2ML IJ SOLN
INTRAMUSCULAR | Status: DC | PRN
  Administered 2022-12-26: 4 mg via INTRAVENOUS

## 2022-12-26 MED ORDER — MEPERIDINE HCL 50 MG/ML IJ SOLN
6.2500 mg | INTRAMUSCULAR | Status: DC | PRN
  Administered 2022-12-26: 12.5 mg via INTRAVENOUS

## 2022-12-26 MED ORDER — ROCURONIUM BROMIDE 10 MG/ML (PF) SYRINGE
PREFILLED_SYRINGE | INTRAVENOUS | Status: DC | PRN
  Administered 2022-12-26: 60 mg via INTRAVENOUS

## 2022-12-26 MED ORDER — LIDOCAINE HCL (PF) 2 % IJ SOLN
INTRAMUSCULAR | Status: AC
  Filled 2022-12-26: qty 5

## 2022-12-26 MED ORDER — LACTATED RINGERS IV SOLN: INTRAVENOUS | Status: DC

## 2022-12-26 MED ORDER — CHLORHEXIDINE GLUCONATE 0.12 % MT SOLN
15.0000 mL | Freq: Once | OROMUCOSAL | Status: AC
  Administered 2022-12-26: 15 mL via OROMUCOSAL

## 2022-12-26 MED ORDER — DIBUCAINE (PERIANAL) 1 % EX OINT
TOPICAL_OINTMENT | CUTANEOUS | Status: AC
  Filled 2022-12-26: qty 28

## 2022-12-26 MED ORDER — LIDOCAINE 2% (20 MG/ML) 5 ML SYRINGE
INTRAMUSCULAR | Status: DC | PRN
  Administered 2022-12-26: 80 mg via INTRAVENOUS

## 2022-12-26 MED ORDER — FENTANYL CITRATE (PF) 100 MCG/2ML IJ SOLN
INTRAMUSCULAR | Status: DC | PRN
  Administered 2022-12-26: 100 ug via INTRAVENOUS

## 2022-12-26 MED ORDER — BUPIVACAINE HCL (PF) 0.25 % IJ SOLN
INTRAMUSCULAR | Status: DC | PRN
  Administered 2022-12-26: 30 mL

## 2022-12-26 MED ORDER — TRAMADOL HCL 50 MG PO TABS: 50.0000 mg | ORAL_TABLET | Freq: Four times a day (QID) | ORAL | 0 refills | Status: AC | PRN

## 2022-12-26 MED ORDER — ROCURONIUM BROMIDE 10 MG/ML (PF) SYRINGE
PREFILLED_SYRINGE | INTRAVENOUS | Status: AC
  Filled 2022-12-26: qty 10

## 2022-12-26 SURGICAL SUPPLY — 41 items
APL SKNCLS STERI-STRIP NONHPOA (GAUZE/BANDAGES/DRESSINGS) ×1
BAG COUNTER SPONGE SURGICOUNT (BAG) IMPLANT
BAG SPNG CNTER NS LX DISP (BAG)
BENZOIN TINCTURE PRP APPL 2/3 (GAUZE/BANDAGES/DRESSINGS) ×2 IMPLANT
BLADE SURG 15 STRL LF DISP TIS (BLADE) IMPLANT
BLADE SURG 15 STRL SS (BLADE)
BRIEF MESH DISP LRG (UNDERPADS AND DIAPERS) ×2 IMPLANT
CNTNR URN SCR LID CUP LEK RST (MISCELLANEOUS) ×2 IMPLANT
CONT SPEC 4OZ STRL OR WHT (MISCELLANEOUS)
COVER SURGICAL LIGHT HANDLE (MISCELLANEOUS) ×2 IMPLANT
DRAPE LAPAROTOMY T 102X78X121 (DRAPES) ×2 IMPLANT
ELECT NDL BLADE 2-5/6 (NEEDLE) ×2 IMPLANT
ELECT NEEDLE BLADE 2-5/6 (NEEDLE) IMPLANT
ELECT REM PT RETURN 15FT ADLT (MISCELLANEOUS) ×2 IMPLANT
GAUZE 4X4 16PLY ~~LOC~~+RFID DBL (SPONGE) ×2 IMPLANT
GAUZE PAD ABD 8X10 STRL (GAUZE/BANDAGES/DRESSINGS) IMPLANT
GAUZE SPONGE 4X4 12PLY STRL (GAUZE/BANDAGES/DRESSINGS) IMPLANT
GLOVE BIO SURGEON STRL SZ7.5 (GLOVE) ×2 IMPLANT
GLOVE INDICATOR 8.0 STRL GRN (GLOVE) ×2 IMPLANT
GOWN STRL REUS W/ TWL XL LVL3 (GOWN DISPOSABLE) ×4 IMPLANT
GOWN STRL REUS W/TWL XL LVL3 (GOWN DISPOSABLE) ×2
KIT BASIN OR (CUSTOM PROCEDURE TRAY) ×2 IMPLANT
KIT TURNOVER KIT A (KITS) IMPLANT
LOOP VESSEL MAXI BLUE (MISCELLANEOUS) IMPLANT
NDL HYPO 22X1.5 SAFETY MO (MISCELLANEOUS) ×2 IMPLANT
NEEDLE HYPO 22X1.5 SAFETY MO (MISCELLANEOUS) ×1 IMPLANT
PACK BASIC VI WITH GOWN DISP (CUSTOM PROCEDURE TRAY) ×2 IMPLANT
PENCIL SMOKE EVACUATOR (MISCELLANEOUS) IMPLANT
SHEARS HARMONIC 9CM CVD (BLADE) IMPLANT
SPIKE FLUID TRANSFER (MISCELLANEOUS) ×2 IMPLANT
SURGILUBE 2OZ TUBE FLIPTOP (MISCELLANEOUS) ×2 IMPLANT
SUT CHROMIC 2 0 SH (SUTURE) ×2 IMPLANT
SUT CHROMIC 3 0 SH 27 (SUTURE) IMPLANT
SUT VIC AB 2-0 SH 27 (SUTURE) ×3
SUT VIC AB 2-0 SH 27X BRD (SUTURE) IMPLANT
SUT VIC AB 2-0 SH 27XBRD (SUTURE) IMPLANT
SUT VIC AB 2-0 UR6 27 (SUTURE) ×12 IMPLANT
SYR 20ML LL LF (SYRINGE) ×2 IMPLANT
SYR 3ML LL SCALE MARK (SYRINGE) IMPLANT
TOWEL OR 17X26 10 PK STRL BLUE (TOWEL DISPOSABLE) ×2 IMPLANT
TOWEL OR NON WOVEN STRL DISP B (DISPOSABLE) ×2 IMPLANT

## 2022-12-26 NOTE — Anesthesia Postprocedure Evaluation (Signed)
Anesthesia Post Note  Patient: Elijah Taylor  Procedure(s) Performed: TRANSANAL EXCISION OF DISTAL RECTAL POLYP ANORECTAL EXAM UNDER ANESTHESIA     Patient location during evaluation: PACU Anesthesia Type: General Level of consciousness: sedated and patient cooperative Pain management: pain level controlled Vital Signs Assessment: post-procedure vital signs reviewed and stable Respiratory status: spontaneous breathing Cardiovascular status: stable Anesthetic complications: no   No notable events documented.  Last Vitals:  Vitals:   12/26/22 1730 12/26/22 1745  BP: (!) 145/90 130/72  Pulse: 92 83  Resp:    Temp: 36.6 C   SpO2: 96% 95%    Last Pain:  Vitals:   12/26/22 1745  TempSrc:   PainSc: 0-No pain                 Lewie Loron

## 2022-12-26 NOTE — Op Note (Signed)
12/26/2022  4:42 PM  PATIENT:  Elijah Taylor  72 y.o. male  Patient Care Team: Dorcas Carrow, DO as PCP - General (Family Medicine)  PRE-OPERATIVE DIAGNOSIS:  Rectal polyp/mass  POST-OPERATIVE DIAGNOSIS:  Same  PROCEDURE:   Transanal excision of distal rectal polypoid mass, 3 x 2 cm under anoscopy Anorectal exam under anesthesia  SURGEON:  Surgeon(s): Andria Meuse, MD  ASSISTANT: Dr. Bernarda Caffey, MD PGY-6     ANESTHESIA:   local and general  SPECIMEN: Rectal polypoid mass-right antero-lateral rectum; oriented on a cork forward with color pins - Pink, proximal - Red, right (lateral) - Blue, distal - Green, anterior (anterior midline of rectum)  DISPOSITION OF SPECIMEN:  PATHOLOGY  COUNTS:  Sponge, needle, and instrument counts were reported correct x2 at conclusion.  EBL: 10 mL  Drains: None  PLAN OF CARE: Discharge to home after PACU  PATIENT DISPOSITION:  PACU - hemodynamically stable.  OR FINDINGS: Gross appearance looks more like a traditional rectal polyp with soft from the villi.  This is located in the right lateral and anterior lateral rectum abutting the dentate line.  This measures approximately 3 cm transversely and 2 cm proximal to distal.  This was fully excised with grossly negative appearing margins and oriented on a cork board using pins as noted above.  Defect closed at conclusion.  DESCRIPTION: The patient was identified in the preoperative holding area and taken to the OR. SCDs were applied.  He then underwent general endotracheal anesthesia without difficulty. The patient was then rolled onto the OR table in the prone jackknife position. Pressure points were then evaluated and padded. Benzoin was applied to the buttocks and they were gently taped apart.  He was then prepped and draped in usual sterile fashion.  A surgical timeout was performed indicating the correct patient, procedure, and positioning.  A perianal block was then created  using a dilute mixture of 0.25% Marcaine with epinephrine and Exparel.  After ascertaining an appropriate level of anesthesia had been achieved, a well lubricated digital rectal exam was performed. This demonstrated soft from the palpable flat polypoid lesion in the right anterior lateral rectum abutting the anal canal.  A Hill-Ferguson anoscope was into the anal canal and circumferential inspection demonstrated polypoid lesion that is sessile in the right anterior lateral position abutting the dentate line.  No ulcerations or firm masslike tissue.  Preparations are made for a transanal excision.  Borders are marked circumferentially with electrocautery including normal rim of tissue.  This is then incised circumferentially with electrocautery.  The skin and mucosa are dissected free of the underlying sphincter tissue without dividing any muscle.  The lesion is excised fully with the anoscope in place.  My assistant then applied gentle pressure to the area of excision.  The specimen orientation is maintained and placed onto a cork board.  It is then marked with color pins as noted above.  I scrubbed back in.  Hemostasis is noted.  Sphincter muscle is intact.  The defect is then closed in a more transverse manner as this results in least amount of tension and with the lowest risk for any sort of anal stricturing given that this specimen is wider than it is deep.  This was done using interrupted 2-0 Vicryl sutures.  The defect is completely closed.  There is no tension on the tissue.  The anal canal was irrigated and hemostasis verified.  Additional local anesthetic is infiltrated around the area of excision.  All  sponge, needle, and instrument counts were reported correct.  The buttocks were then untaped and a dressing consisting of 4 x 4, ABD, and mesh underwear was placed.  He was rolled back onto a stretcher, awakened from anesthesia, extubated, and transported to the recovery room in satisfactory  condition.  I subsequently walked the specimen myself down to the pathology department and passed it off to the pathology PA whom is doing gross tissue preparation today.  DISPOSITION: PACU in satisfactory condition.

## 2022-12-26 NOTE — Discharge Instructions (Addendum)
ANORECTAL SURGERY: POST OP INSTRUCTIONS  DIET: Follow a light bland diet the first 24 hours after arrival home, such as soup, liquids, crackers, etc.  Be sure to include lots of fluids daily.  Avoid fast food or heavy meals as your are more likely to get nauseated.  Eat a low fat diet the next few days after surgery.   Some bleeding with bowel movements is expected for the first couple of days but this should stop in between bowel movements  Take your usually prescribed home medications unless otherwise directed. No foreign bodies per rectum for the next 3 months (enemas, etc)  PAIN CONTROL: It is helpful to take an over-the-counter pain medication regularly for the first few days/weeks.  Choose from the following that works best for you: Ibuprofen (Advil, etc) Three 200mg tabs every 6 hours as needed. Acetaminophen (Tylenol, etc) 500-650mg every 6 hours as needed NOTE: You may take both of these medications together - most patients find it most helpful when alternating between the two (i.e. Ibuprofen at 6am, tylenol at 9am, ibuprofen at 12pm ...) A  prescription for pain medication may have been prescribed for you at discharge.  Take your pain medication as prescribed.  If you are having problems/concerns with the prescription medicine, please call us for further advice.  Avoid getting constipated.  Between the surgery and the pain medications, it is common to experience some constipation.  Increasing fluid intake (64oz of water per day) and taking a fiber supplement (such as Metamucil, Citrucel, FiberCon) 1-2 times a day regularly will usually help prevent this problem from occurring.  Take Miralax (over the counter) 1-2x/day while taking a narcotic pain medication. If no bowel movement after 48hours, you may additionally take a laxative like a bottle of Milk of Magnesia which can be purchased over the counter. Avoid enemas.   Watch out for diarrhea.  If you have many loose bowel movements,  simplify your diet to bland foods.  Stop any stool softeners and decrease your fiber supplement. If this worsens or does not improve, please call us.  Wash / shower every day.  If you were discharged with a dressing, you may remove this the day after your surgery. You may shower normally, getting soap/water on your wound, particularly after bowel movements.  Soaking in a warm bath filled a couple inches ("Sitz bath") is a great way to clean the area after a bowel movement and many patients find it is a way to soothe the area.  ACTIVITIES as tolerated:   You may resume regular (light) daily activities beginning the next day--such as daily self-care, walking, climbing stairs--gradually increasing activities as tolerated.  If you can walk 30 minutes without difficulty, it is safe to try more intense activity such as jogging, treadmill, bicycling, low-impact aerobics, etc. Refrain from any heavy lifting or straining for the first 2 weeks after your procedure, particularly if your surgery was for hemorrhoids. Avoid activities that make your pain worse You may drive when you are no longer taking prescription pain medication, you can comfortably wear a seatbelt, and you can safely maneuver your car and apply brakes.  FOLLOW UP in our office Please call [facility] at [phone] to set up an appointment to see your surgeon in the office for a follow-up appointment approximately 2 weeks after your surgery. Make sure that you call for this appointment the day you arrive home to insure a convenient appointment time.  9. If you have disability or family leave forms   that need to be completed, you may have them completed by your primary care physician's office; for return to work instructions, please ask our office staff and they will be happy to assist you in obtaining this documentation   When to call us [phone]: Poor pain control Reactions / problems with new medications (rash/itching, etc)  Fever over  101.5 F (38.5 C) Inability to urinate Nausea/vomiting Worsening swelling or bruising Continued bleeding from incision. Increased pain, redness, or drainage from the incision  The clinic staff is available to answer your questions during regular business hours (8:30am-5pm).  Please don't hesitate to call and ask to speak to one of our nurses for clinical concerns.   A surgeon from [facility] is always on call at the hospitals   If you have a medical emergency, go to the nearest emergency room or call 911.   [facility] [address], [address], [place], [place]  [address] MAIN: [phone] FAX: [phone] [email or web address] 

## 2022-12-26 NOTE — Anesthesia Procedure Notes (Signed)
Procedure Name: Intubation Date/Time: 12/26/2022 3:40 PM  Performed by: Ponciano Ort, CRNAPre-anesthesia Checklist: Patient identified, Emergency Drugs available, Suction available and Patient being monitored Patient Re-evaluated:Patient Re-evaluated prior to induction Oxygen Delivery Method: Circle system utilized Preoxygenation: Pre-oxygenation with 100% oxygen Induction Type: IV induction Ventilation: Mask ventilation without difficulty Laryngoscope Size: Glidescope and 4 (per pt's request.) Grade View: Grade I Tube type: Oral Number of attempts: 1 Airway Equipment and Method: Stylet and Oral airway Placement Confirmation: ETT inserted through vocal cords under direct vision, positive ETCO2 and breath sounds checked- equal and bilateral Secured at: 21 cm Tube secured with: Tape Dental Injury: Teeth and Oropharynx as per pre-operative assessment

## 2022-12-26 NOTE — Anesthesia Preprocedure Evaluation (Addendum)
Anesthesia Evaluation  Patient identified by MRN, date of birth, ID band Patient awake    Reviewed: Allergy & Precautions, NPO status , Patient's Chart, lab work & pertinent test results  History of Anesthesia Complications Negative for: history of anesthetic complications  Airway Mallampati: II  TM Distance: >3 FB Neck ROM: full    Dental no notable dental hx. (+) Dental Advisory Given, Teeth Intact   Pulmonary neg pulmonary ROS   Pulmonary exam normal        Cardiovascular negative cardio ROS Normal cardiovascular exam     Neuro/Psych negative neurological ROS  negative psych ROS   GI/Hepatic negative GI ROS, Neg liver ROS,,,  Endo/Other  negative endocrine ROS    Renal/GU negative Renal ROS Bladder dysfunction      Musculoskeletal  (+) Arthritis ,    Abdominal   Peds  Hematology negative hematology ROS (+)   Anesthesia Other Findings Past Medical History: No date: Arthritis No date: History of kidney stones No date: Insomnia No date: Pneumonia No date: Prostate cancer (HCC) No date: Urinary bladder incontinence     Comment:  Related to Prostectomy No date: Wears glasses  Past Surgical History: 05/01/2020: ANTERIOR CERVICAL DECOMP/DISCECTOMY FUSION; N/A     Comment:  Procedure: Cervical Four-Five Anterior Cervical               Decompression/Discectomy/Fusion with removal of Cervical               Three-Four Hardware;  Surgeon: Tia Alert, MD;                Location: [facility] OR;  Service: Neurosurgery;  Laterality:               N/A; No date: CARPAL TUNNEL RELEASE     Comment:  left 12/01/2014: CARPAL TUNNEL RELEASE; Right     Comment:  Procedure: RIGHT CARPAL TUNNEL RELEASE;  Surgeon: Cindee Salt, MD;  Location: [facility];                Service: Orthopedics;  Laterality: Right; 01/11/2022: CARPAL TUNNEL RELEASE; Left     Comment:  Procedure: LEFT CARPAL TUNNEL RELEASE;   Surgeon: Betha Loa, MD;  Location: [facility];                Service: Orthopedics;  Laterality: Left; No date: CATARACT EXTRACTION W/ INTRAOCULAR LENS IMPLANT; Right No date: CATARACT EXTRACTION W/ INTRAOCULAR LENS IMPLANT; Right 2007: CERVICAL FUSION No date: COLONOSCOPY 11/07/2017: COLONOSCOPY WITH PROPOFOL; N/A     Comment:  Procedure: COLONOSCOPY WITH PROPOFOL;  Surgeon: [name], MD;  Location: [facility];  Service:               Gastroenterology;  Laterality: N/A; 09/15/2015: CYSTOSCOPY MACROPLASTIQUE IMPLANT; N/A     Comment:  Procedure: CYSTOSCOPY MACROPLASTIQUE IMPLANT;  Surgeon:               Orson Ape, MD;  Location: [facility] ORS;  Service:               Urology;  Laterality: N/A; 06/29/2018: CYSTOSCOPY/URETEROSCOPY/HOLMIUM LASER/STENT PLACEMENT;  Left     Comment:  Procedure: CYSTOSCOPY/URETEROSCOPY/HOLMIUM LASER/STENT  PLACEMENT;  Surgeon: Sondra Come, MD;  Location:               [facility] ORS;  Service: Urology;  Laterality: Left; 07/12/2018: EXTRACORPOREAL SHOCK WAVE LITHOTRIPSY; Left     Comment:  Procedure: EXTRACORPOREAL SHOCK WAVE LITHOTRIPSY (ESWL);              Surgeon: Sondra Come, MD;  Location: [facility] ORS;                Service: Urology;  Laterality: Left; 10/18/2018: EXTRACORPOREAL SHOCK WAVE LITHOTRIPSY; Right     Comment:  Procedure: EXTRACORPOREAL SHOCK WAVE LITHOTRIPSY (ESWL);              Surgeon: Orson Ape, MD;  Location: [facility] ORS;                Service: Urology;  Laterality: Right; 2010: RETROPUBIC PROSTATECTOMY No date: SHOULDER ARTHROSCOPY W/ ROTATOR CUFF REPAIR     Comment:  right and left No date: TRIGGER FINGER RELEASE     Comment:  x 4 09/02/2014: TRIGGER FINGER RELEASE; Left     Comment:  Procedure: RELEASE A-1 PULLEY LEFT INDEX FINGER;                Surgeon: Cindee Salt, MD;  Location: [facility];  Service: Orthopedics;  Laterality: Left;                 ANESTHESIA: IV REGIONAL FAB 05/16/2017: TRIGGER FINGER RELEASE; Right     Comment:  Procedure: RELEASE TRIGGER FINGER/A-1 PULLEY RIGHT               MIDDLE FINGER;  Surgeon: Cindee Salt, MD;  Location:               [facility];  Service: Orthopedics;                Laterality: Right; 03/28/2019: TRIGGER FINGER RELEASE; Right     Comment:  Procedure: RELEASE TRIGGER FINGER/A-1 PULLEY RIGHT               INDEX;  Surgeon: Cindee Salt, MD;  Location: [facility];  Service: Orthopedics;  Laterality:               Right;  FAB     Reproductive/Obstetrics negative OB ROS                             Anesthesia Physical Anesthesia Plan  ASA: 2  Anesthesia Plan: General   Post-op Pain Management: Tylenol PO (pre-op)*   Induction: Intravenous  PONV Risk Score and Plan: 1 and Ondansetron, Dexamethasone and Treatment may vary due to age or medical condition  Airway Management Planned: Oral ETT and Video Laryngoscope Planned  Additional Equipment:   Intra-op Plan:   Post-operative Plan: Extubation in OR  Informed Consent: I have reviewed the patients History and Physical, chart, labs and discussed the procedure including the risks, benefits and alternatives for the proposed anesthesia with the patient or authorized representative who has indicated his/her understanding and acceptance.     Dental advisory given  Plan Discussed with: CRNA  Anesthesia Plan Comments: (Pt requested gilidescope )       Anesthesia Quick Evaluation

## 2022-12-26 NOTE — Transfer of Care (Signed)
Immediate Anesthesia Transfer of Care Note  Patient: Elijah Taylor  Procedure(s) Performed: TRANSANAL EXCISION OF DISTAL RECTAL POLYP ANORECTAL EXAM UNDER ANESTHESIA  Patient Location: PACU  Anesthesia Type:General  Level of Consciousness: awake, alert , oriented, and patient cooperative  Airway & Oxygen Therapy: Patient Spontanous Breathing and Patient connected to face mask oxygen  Post-op Assessment: Report given to RN and Post -op Vital signs reviewed and stable  Post vital signs: Reviewed and stable  Last Vitals:  Vitals Value Taken Time  BP 139/92 12/26/22 1646  Temp    Pulse 88 12/26/22 1648  Resp 23 12/26/22 1649  SpO2 100 % 12/26/22 1648  Vitals shown include unvalidated device data.  Last Pain:  Vitals:   12/26/22 1250  TempSrc:   PainSc: 0-No pain         Complications: No notable events documented.

## 2022-12-26 NOTE — H&P (Signed)
CC: Here today for surgery  HPI: Elijah Taylor is an 72 y.o. male with history of kidney stones, prostate cancer, urinary bladder incontinence (2/2 prostatectomy), whom is seen in the office today as a referral by Dr. 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   Colononscopy with Dr. Tobi Bastos 11/14/22 for personal hx of adenomatous polyps -  - Two 3 to 4 mm polyps in the transverse colon and in the ascending colon, removed with a cold biopsy forceps. Resected and retrieved. - One 20 mm polyp in the distal rectum. Biopsied. - Diverticulosis in the sigmoid colon. - The examination was otherwise normal on direct and retroflexion views.  A. COLON POLYP, ASCENDING; COLD BIOPSY:  - COLONIC MUCOSA WITH PROMINENT LYMPHOID AGGREGATE AND REACTIVE  HYPERPLASTIC EPITHELIAL CHANGE.  - NEGATIVE FOR DYSPLASIA AND MALIGNANCY.   B. COLON POLYP, TRANSVERSE; COLD BIOPSY:  - HYPERPLASTIC POLYP.  - NEGATIVE FOR DYSPLASIA AND MALIGNANCY.   C. RECTUM POLYP; COLD BIOPSY:  - CONDYLOMA WITH HIGH-GRADE SQUAMOUS INTRAEPITHELIAL LESION  (HSIL/AIN-2).   He denies any changes in health or health history since we met in the office. No new medications/allergies. He states he is ready for surgery today.  PMH: Kidney stones, prostate cancer, urinary bladder incontinence (2/2 prostatectomy)  PSH: Open prostatectomy at [facility]  FHx: Denies any known family history of colorectal, breast, endometrial or ovarian cancer  Social Hx: Denies use of tobacco/illicit drug. 1/2 glass of wine per week. 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 He is here today with his wife.   Past Medical History:  Diagnosis Date   Arthritis    History of kidney stones    Insomnia    Prostate cancer (HCC)    Urinary bladder incontinence    Related to Prostectomy   Wears glasses     Past Surgical History:  Procedure Laterality Date    bilateral rotator cuff surgery       ANTERIOR CERVICAL DECOMP/DISCECTOMY FUSION N/A 05/01/2020   Procedure: Cervical Four-Five Anterior Cervical Decompression/Discectomy/Fusion with removal of Cervical Three-Four Hardware;  Surgeon: Tia Alert, MD;  Location: [facility];  Service: Neurosurgery;  Laterality: N/A;   CARPAL TUNNEL RELEASE     left   CARPAL TUNNEL RELEASE Right 12/01/2014   Procedure: RIGHT CARPAL TUNNEL RELEASE;  Surgeon: Cindee Salt, MD;  Location: [facility];  Service: Orthopedics;  Laterality: Right;   CARPAL TUNNEL RELEASE Left 01/11/2022   Procedure: LEFT CARPAL TUNNEL RELEASE;  Surgeon: Betha Loa, MD;  Location: [facility];  Service: Orthopedics;  Laterality: Left;   CATARACT EXTRACTION W/ INTRAOCULAR LENS IMPLANT Right    CATARACT EXTRACTION W/ INTRAOCULAR LENS IMPLANT Right    CERVICAL FUSION  2007   COLONOSCOPY     COLONOSCOPY WITH PROPOFOL N/A 11/07/2017   Procedure: COLONOSCOPY WITH PROPOFOL;  Surgeon: [name], MD;  Location: [facility];  Service: Gastroenterology;  Laterality: N/A;   COLONOSCOPY WITH PROPOFOL N/A 11/14/2022   Procedure: COLONOSCOPY WITH PROPOFOL;  Surgeon: [name], MD;  Location: [facility] END[facility]COPY;  Service: Gastroenterology;  Laterality: N/A;   CYSTOSCOPY MACROPLASTIQUE IMPLANT N/A 09/15/2015   Procedure: CYSTOSCOPY MACROPLASTIQUE IMPLANT;  Surgeon: Orson Ape, MD;  Location: [facility] ORS;  Service: Urology;  Laterality: N/A;   CYSTOSCOPY/URETEROSCOPY/HOLMIUM LASER/STENT PLACEMENT Left 06/29/2018   Procedure: CYSTOSCOPY/URETEROSCOPY/HOLMIUM LASER/STENT PLACEMENT;  Surgeon: Sondra Come, MD;  Location: [facility]S;  Service: Urology;  Laterality: Left;   EXTRACORPOREAL SHOCK WAVE  LITHOTRIPSY Left 07/12/2018   Procedure: EXTRACORPOREAL SHOCK WAVE LITHOTRIPSY (ESWL);  Surgeon: Sondra Come, MD;  Location: [facility] ORS;  Service: Urology;  Laterality: Left;   EXTRACORPOREAL SHOCK WAVE LITHOTRIPSY Right 10/18/2018   Procedure: EXTRACORPOREAL SHOCK  WAVE LITHOTRIPSY (ESWL);  Surgeon: Orson Ape, MD;  Location: [facility] ORS;  Service: Urology;  Laterality: Right;   lower back surgery      x 2   reconstructive thumb surgery      RETROPUBIC PROSTATECTOMY  2010   SHOULDER ARTHROSCOPY W/ ROTATOR CUFF REPAIR     right and left   TRIGGER FINGER RELEASE     x 4   TRIGGER FINGER RELEASE Left 09/02/2014   Procedure: RELEASE A-1 PULLEY LEFT INDEX FINGER;  Surgeon: Cindee Salt, MD;  Location: [facility];  Service: Orthopedics;  Laterality: Left;  ANESTHESIA: IV REGIONAL FAB   TRIGGER FINGER RELEASE Right 05/16/2017   Procedure: RELEASE TRIGGER FINGER/A-1 PULLEY RIGHT MIDDLE FINGER;  Surgeon: Cindee Salt, MD;  Location: [facility];  Service: Orthopedics;  Laterality: Right;   TRIGGER FINGER RELEASE Right 03/28/2019   Procedure: RELEASE TRIGGER FINGER/A-1 PULLEY RIGHT INDEX;  Surgeon: Cindee Salt, MD;  Location: [facility];  Service: Orthopedics;  Laterality: Right;  FAB    Family History  Problem Relation Age of Onset   Cancer Mother        Brain Tumor   Kidney failure Father    Prostate cancer Father    Heart disease Brother    Heart attack Maternal Grandfather    Prostate cancer Maternal Uncle    Prostate cancer Maternal Uncle     Social:  reports that he has never smoked. He has never used smokeless tobacco. He reports current alcohol use of about 1.0 standard drink of alcohol per week. He reports that he does not use drugs.  Allergies: No Known Allergies  Medications: I have reviewed the patient's current medications.  No results found for this or any previous visit (from the past 48 hour(s)).  No results found.   PE There were no vitals taken for this visit. Constitutional: NAD; conversant Eyes: Moist conjunctiva; no lid lag; anicteric Lungs: Normal respiratory effort CV: RRR Psychiatric: Appropriate affect  No results found for this or any previous visit (from the past 48  hour(s)).  No results found.  A/P: Elijah Taylor is an 72 y.o. male with hx of kidney stones, prostate cancer, urinary bladder incontinence (2/2 prostatectomy) here for evaluation of HGSIL of the anorectal junction - seen on retroflexion, just at/above dentate  -The anatomy and physiology of the anal canal was discussed with the patient with associated pictures. The pathophysiology of high-grade squamous intraepithelial neoplasia of the anal canal was discussed at length with associated pictures and illustrations. -We discussed that generally these atypical cellular findings are indicative of infection with the human papilloma virus (HPV) which represents a sexually transmitted infection. We discussed the importance of safe sex practices. -We have reviewed options going forward including further observation vs surgery, recommending surgery -transanal excision of distal rectal lesion under anoscopy; anorectal exam under anesthesia.  -The planned procedure, material risks (including, but not limited to, pain, bleeding, infection, scarring, need for blood transfusion, damage to anal sphincter, incontinence of gas and/or stool, need for additional procedures, anal stenosis, rare cases of pelvic sepsis which in severe cases may require things like a colostomy, recurrence, pneumonia, heart attack, stroke, death) benefits and alternatives to surgery were discussed at length.  I noted a good probability that the procedure would help improve their symptoms. The patient's questions were answered to his satisfaction, he voiced understanding and elected to proceed with surgery. Additionally, we discussed typical postoperative expectations and the recovery process.    Marin Olp, MD [facility]

## 2022-12-27 ENCOUNTER — Encounter (HOSPITAL_COMMUNITY): Payer: Self-pay | Admitting: Surgery

## 2022-12-29 LAB — SURGICAL PATHOLOGY

## 2023-02-02 ENCOUNTER — Ambulatory Visit (INDEPENDENT_AMBULATORY_CARE_PROVIDER_SITE_OTHER): Payer: Medicare Other | Admitting: Physician Assistant

## 2023-02-02 ENCOUNTER — Encounter: Payer: Self-pay | Admitting: Physician Assistant

## 2023-02-02 VITALS — BP 119/71 | HR 93 | Temp 98.3°F | Ht 69.0 in | Wt 186.0 lb

## 2023-02-02 DIAGNOSIS — H109 Unspecified conjunctivitis: Secondary | ICD-10-CM

## 2023-02-02 DIAGNOSIS — B9689 Other specified bacterial agents as the cause of diseases classified elsewhere: Secondary | ICD-10-CM

## 2023-02-02 MED ORDER — ERYTHROMYCIN 5 MG/GM OP OINT
1.0000 | TOPICAL_OINTMENT | Freq: Four times a day (QID) | OPHTHALMIC | 0 refills | Status: AC
Start: 2023-02-02 — End: 2023-02-09

## 2023-02-02 NOTE — Patient Instructions (Addendum)
Based on your symptoms I believe that you have bacterial conjunctivitis   I have sent in a script for Erythromycin ophthalmic ointment - please apply a 1/2 inch strip of the ointment to your right eye every 6 hours for 7 days   Make sure you wash your hands before and after touching your eyes to administer the medication  You can use sterile eye flushes and lubricating eye drops to assist with eye irritation and further resolution You can also use a warm, wet compress over the eye to assist with swelling and matting - especially in the morning  If you have used makeup or mascara on that eye I recommend discarding it as this can cause recurrent infection. Thoroughly wash any makeup brushes and avoid using makeup while recovering from the infection.  If you notice the following please return to the office: lack of improvement, eyelid swelling, increased eye irritation If you notice the following please go to the ED: eye pressure causing displacement of the eye, vision changes, increased eye pain or foreign body sensation, fever

## 2023-02-02 NOTE — Progress Notes (Signed)
Acute Office Visit   Patient: Elijah Taylor   DOB: 1950/12/22   72 y.o. Male  MRN: [id number] Visit Date: 02/02/2023  Today's healthcare provider: Oswaldo Conroy Ilanna Deihl, PA-C  Introduced myself to the patient as a Secondary school teacher and provided education on APPs in clinical practice.    Chief Complaint  Patient presents with   Eye Problem    Patient says a couple of days ago, he noticed a little grid in his eye and says after that it has went down hill from. Patient declines having any drainage from the eye. Patient says it itching really bad and says it is worse in the L eye. Patient says he has been using an old prescription of eye drops for an infected eye in the past.    Stye    Patient says he had a Stye burnt off from the corner of his R eye and says every now and then he has to keep removing a hard spot to prevent it from getting in his eye.    Subjective    HPI HPI     Eye Problem    Additional comments: Patient says a couple of days ago, he noticed a little grid in his eye and says after that it has went down hill from. Patient declines having any drainage from the eye. Patient says it itching really bad and says it is worse in the L eye. Patient says he has been using an old prescription of eye drops for an infected eye in the past.         Stye    Additional comments: Patient says he had a Stye burnt off from the corner of his R eye and says every now and then he has to keep removing a hard spot to prevent it from getting in his eye.       Last edited by Malen Gauze, CMA on 02/02/2023  1:46 PM.       Eye Concerns  Onset: sudden  Duration: 2 days - felt like he had grit in the eye and tried flushing his eye with water but then it started to get red and swollen   Reports left eye is main concern but it has progressed to include the right eye as well Interventions: flushing eye with water, eye drops (unsure what it is) He reports his left eye is very itchy and feels  like there is something in it    Medications: Outpatient Medications Prior to Visit  Medication Sig   aspirin EC 81 MG tablet Take 81 mg by mouth daily with supper.   diphenhydrAMINE HCl (ZZZQUIL) 50 MG/30ML LIQD Take 25 mg by mouth at bedtime.   diphenhydramine-acetaminophen (TYLENOL PM) 25-500 MG TABS tablet Take 1 tablet by mouth at bedtime.   Multiple Vitamins-Minerals (MULTIVITAMIN WITH MINERALS) tablet Take 1 tablet by mouth daily with supper.   triazolam (HALCION) 0.125 MG tablet Take 0.25 mg by mouth at bedtime as needed for sleep.   No facility-administered medications prior to visit.    Review of Systems  Eyes:  Positive for discharge, redness, itching and visual disturbance. Negative for photophobia and pain.  Skin:  Negative for rash.       Objective    BP 119/71   Pulse 93   Temp 98.3 F (36.8 C) (Oral)   Ht 5\' 9"  (1.753 m)   Wt 186 lb (84.4 kg)   SpO2 96%   BMI 27.47  kg/m    Physical Exam Vitals reviewed.  Constitutional:      General: He is awake.     Appearance: Normal appearance. He is well-developed and well-groomed.  HENT:     Head: Normocephalic and atraumatic.  Eyes:     General: Lids are everted, no foreign bodies appreciated. Gaze aligned appropriately. No allergic shiner.       Right eye: Discharge present. No foreign body.        Left eye: Discharge present.No foreign body.     Extraocular Movements:     Right eye: Normal extraocular motion and no nystagmus.     Left eye: Normal extraocular motion and no nystagmus.     Conjunctiva/sclera:     Right eye: Right conjunctiva is not injected.     Left eye: Left conjunctiva is injected. No hemorrhage.    Pupils: Pupils are equal, round, and reactive to light.  Neurological:     Mental Status: He is alert.  Psychiatric:        Behavior: Behavior is cooperative.       No results found for any visits on 02/02/23.  Assessment & Plan      No follow-ups on file.      Problem List Items  Addressed This Visit   None Visit Diagnoses     Bacterial conjunctivitis of both eyes    -  Primary Acute, new concern Patient presents with redness and drainage predominantly in left eye with some redness in right as well Mild periocular swelling on left side  Recommend he dc current home eye drops and start Erythromycin ointment for management We discussed using sterile eye flushes and lubricating eye drops as well to further assist with symptoms Recommend he goes to eye doctor for evaluation if symptoms are not improving with current therapies  Follow up as needed for persistent or progressing symptoms     Relevant Medications   erythromycin ophthalmic ointment        No follow-ups on file.   I, Kary Colaizzi E Sandia Pfund, PA-C, have reviewed all documentation for this visit. The documentation on 02/03/23 for the exam, diagnosis, procedures, and orders are all accurate and complete.   Jacquelin Hawking, MHS, PA-C [facility]

## 2023-03-27 DIAGNOSIS — M65352 Trigger finger, left little finger: Secondary | ICD-10-CM | POA: Diagnosis not present

## 2023-03-28 ENCOUNTER — Other Ambulatory Visit: Payer: Self-pay

## 2023-03-28 ENCOUNTER — Telehealth: Payer: Medicare Other | Admitting: Gastroenterology

## 2023-03-28 DIAGNOSIS — Z86010 Personal history of colonic polyps: Secondary | ICD-10-CM

## 2023-03-30 ENCOUNTER — Other Ambulatory Visit: Payer: Self-pay | Admitting: Orthopedic Surgery

## 2023-04-06 ENCOUNTER — Encounter (HOSPITAL_BASED_OUTPATIENT_CLINIC_OR_DEPARTMENT_OTHER): Payer: Self-pay | Admitting: Orthopedic Surgery

## 2023-04-06 ENCOUNTER — Other Ambulatory Visit: Payer: Self-pay

## 2023-04-14 ENCOUNTER — Ambulatory Visit (HOSPITAL_BASED_OUTPATIENT_CLINIC_OR_DEPARTMENT_OTHER): Payer: Medicare Other | Admitting: Certified Registered"

## 2023-04-14 ENCOUNTER — Other Ambulatory Visit: Payer: Self-pay

## 2023-04-14 ENCOUNTER — Encounter (HOSPITAL_BASED_OUTPATIENT_CLINIC_OR_DEPARTMENT_OTHER)
Admission: RE | Disposition: A | Discharge status: Home / Self Care | Payer: Self-pay | Source: Home / Self Care | Attending: Orthopedic Surgery

## 2023-04-14 ENCOUNTER — Ambulatory Visit (HOSPITAL_BASED_OUTPATIENT_CLINIC_OR_DEPARTMENT_OTHER)
Admission: RE | Admit: 2023-04-14 | Discharge: 2023-04-14 | Disposition: A | Discharge status: Home / Self Care | Payer: Medicare Other | Attending: Orthopedic Surgery | Admitting: Orthopedic Surgery

## 2023-04-14 ENCOUNTER — Encounter (HOSPITAL_BASED_OUTPATIENT_CLINIC_OR_DEPARTMENT_OTHER): Payer: Self-pay | Admitting: Orthopedic Surgery

## 2023-04-14 DIAGNOSIS — M1990 Unspecified osteoarthritis, unspecified site: Secondary | ICD-10-CM | POA: Diagnosis not present

## 2023-04-14 DIAGNOSIS — M65352 Trigger finger, left little finger: Secondary | ICD-10-CM | POA: Diagnosis not present

## 2023-04-14 HISTORY — PX: TRIGGER FINGER RELEASE: SHX641

## 2023-04-14 SURGERY — RELEASE, A1 PULLEY, FOR TRIGGER FINGER
Anesthesia: Monitor Anesthesia Care | Site: Finger | Laterality: Left

## 2023-04-14 MED ORDER — PROPOFOL 10 MG/ML IV BOLUS
INTRAVENOUS | Status: DC | PRN
Start: 2023-04-14 — End: 2023-04-14
  Administered 2023-04-14: 40 mg via INTRAVENOUS
  Administered 2023-04-14: 50 mg via INTRAVENOUS

## 2023-04-14 MED ORDER — LIDOCAINE HCL (CARDIAC) PF 100 MG/5ML IV SOSY
PREFILLED_SYRINGE | INTRAVENOUS | Status: DC | PRN
  Administered 2023-04-14: 30 mg via INTRAVENOUS

## 2023-04-14 MED ORDER — ACETAMINOPHEN-CODEINE 300-30 MG PO TABS
1.0000 | ORAL_TABLET | Freq: Four times a day (QID) | ORAL | 0 refills | Status: DC | PRN
Start: 2023-04-14 — End: 2023-12-04

## 2023-04-14 MED ORDER — OXYCODONE HCL 5 MG PO TABS: 5.0000 mg | ORAL_TABLET | Freq: Once | ORAL | Status: DC | PRN

## 2023-04-14 MED ORDER — FENTANYL CITRATE (PF) 100 MCG/2ML IJ SOLN: 25.0000 ug | INTRAMUSCULAR | Status: DC | PRN

## 2023-04-14 MED ORDER — PROPOFOL 500 MG/50ML IV EMUL
INTRAVENOUS | Status: DC | PRN
  Administered 2023-04-14: 100 ug/kg/min via INTRAVENOUS

## 2023-04-14 MED ORDER — CEFAZOLIN SODIUM-DEXTROSE 2-4 GM/100ML-% IV SOLN: 2.0000 g | INTRAVENOUS | Status: DC

## 2023-04-14 MED ORDER — LACTATED RINGERS IV SOLN: INTRAVENOUS | Status: DC

## 2023-04-14 MED ORDER — FENTANYL CITRATE (PF) 100 MCG/2ML IJ SOLN
INTRAMUSCULAR | Status: AC
  Filled 2023-04-14: qty 2

## 2023-04-14 MED ORDER — ONDANSETRON HCL 4 MG/2ML IJ SOLN
INTRAMUSCULAR | Status: AC
  Filled 2023-04-14: qty 2

## 2023-04-14 MED ORDER — BUPIVACAINE HCL (PF) 0.25 % IJ SOLN
INTRAMUSCULAR | Status: DC | PRN
  Administered 2023-04-14: 9 mL

## 2023-04-14 MED ORDER — ONDANSETRON HCL 4 MG/2ML IJ SOLN
INTRAMUSCULAR | Status: DC | PRN
  Administered 2023-04-14: 4 mg via INTRAVENOUS

## 2023-04-14 MED ORDER — FENTANYL CITRATE (PF) 100 MCG/2ML IJ SOLN
INTRAMUSCULAR | Status: DC | PRN
  Administered 2023-04-14 (×2): 50 ug via INTRAVENOUS

## 2023-04-14 MED ORDER — CEFAZOLIN SODIUM-DEXTROSE 2-4 GM/100ML-% IV SOLN
INTRAVENOUS | Status: AC
  Filled 2023-04-14: qty 100

## 2023-04-14 MED ORDER — OXYCODONE HCL 5 MG/5ML PO SOLN: 5.0000 mg | Freq: Once | ORAL | Status: DC | PRN

## 2023-04-14 MED ORDER — ONDANSETRON HCL 4 MG/2ML IJ SOLN: 4.0000 mg | Freq: Four times a day (QID) | INTRAMUSCULAR | Status: DC | PRN

## 2023-04-14 SURGICAL SUPPLY — 33 items
APL PRP STRL LF DISP 70% ISPRP (MISCELLANEOUS) ×1
BLADE SURG 15 STRL LF DISP TIS (BLADE) ×4 IMPLANT
BLADE SURG 15 STRL SS (BLADE) ×2
BNDG CMPR 5X2 CHSV 1 LYR STRL (GAUZE/BANDAGES/DRESSINGS) ×1
BNDG CMPR 9X4 STRL LF SNTH (GAUZE/BANDAGES/DRESSINGS)
BNDG COHESIVE 2X5 TAN ST LF (GAUZE/BANDAGES/DRESSINGS) ×2 IMPLANT
BNDG ESMARK 4X9 LF (GAUZE/BANDAGES/DRESSINGS) IMPLANT
CHLORAPREP W/TINT 26 (MISCELLANEOUS) ×2 IMPLANT
CORD BIPOLAR FORCEPS 12FT (ELECTRODE) ×2 IMPLANT
COVER BACK TABLE 60X90IN (DRAPES) ×2 IMPLANT
COVER MAYO STAND STRL (DRAPES) ×2 IMPLANT
CUFF TOURN SGL QUICK 18X4 (TOURNIQUET CUFF) ×2 IMPLANT
DRAPE EXTREMITY T 121X128X90 (DISPOSABLE) ×2 IMPLANT
DRAPE SURG 17X23 STRL (DRAPES) ×2 IMPLANT
GAUZE SPONGE 4X4 12PLY STRL (GAUZE/BANDAGES/DRESSINGS) ×2 IMPLANT
GAUZE XEROFORM 1X8 LF (GAUZE/BANDAGES/DRESSINGS) ×2 IMPLANT
GLOVE BIO SURGEON STRL SZ7.5 (GLOVE) ×2 IMPLANT
GLOVE BIOGEL PI IND STRL 6.5 (GLOVE) IMPLANT
GLOVE BIOGEL PI IND STRL 8 (GLOVE) ×2 IMPLANT
GLOVE SURG SS PI 6.5 STRL IVOR (GLOVE) IMPLANT
GOWN STRL REUS W/ TWL LRG LVL3 (GOWN DISPOSABLE) ×2 IMPLANT
GOWN STRL REUS W/TWL LRG LVL3 (GOWN DISPOSABLE) ×1
GOWN STRL REUS W/TWL XL LVL3 (GOWN DISPOSABLE) ×2 IMPLANT
NDL HYPO 25X1 1.5 SAFETY (NEEDLE) ×2 IMPLANT
NEEDLE HYPO 25X1 1.5 SAFETY (NEEDLE) ×1
NS IRRIG 1000ML POUR BTL (IV SOLUTION) ×2 IMPLANT
PACK BASIN DAY SURGERY FS (CUSTOM PROCEDURE TRAY) ×2 IMPLANT
STOCKINETTE 4X48 STRL (DRAPES) ×2 IMPLANT
SUT ETHILON 4 0 PS 2 18 (SUTURE) ×2 IMPLANT
SYR BULB EAR ULCER 3OZ GRN STR (SYRINGE) ×2 IMPLANT
SYR CONTROL 10ML LL (SYRINGE) ×2 IMPLANT
TOWEL GREEN STERILE FF (TOWEL DISPOSABLE) ×4 IMPLANT
UNDERPAD 30X36 HEAVY ABSORB (UNDERPADS AND DIAPERS) ×2 IMPLANT

## 2023-04-14 NOTE — Transfer of Care (Signed)
Immediate Anesthesia Transfer of Care Note  Patient: Elijah Taylor  Procedure(s) Performed: RELEASE TRIGGER FINGER/A-1 PULLEY LEFT SMALL FINGER (Left: Finger)  Patient Location: PACU  Anesthesia Type:MAC and Bier block  Level of Consciousness: awake, alert , and oriented  Airway & Oxygen Therapy: Patient Spontanous Breathing  Post-op Assessment: Report given to RN and Post -op Vital signs reviewed and stable  Post vital signs: Reviewed and stable  Last Vitals:  Vitals Value Taken Time  BP 109/69 04/14/23 1426  Temp 36.8 C 04/14/23 1427  Pulse 80 04/14/23 1429  Resp 12 04/14/23 1429  SpO2 96 % 04/14/23 1429  Vitals shown include unfiled device data.  Last Pain:  Vitals:   04/14/23 1204  TempSrc: Oral  PainSc: 0-No pain      Patients Stated Pain Goal: 4 (04/14/23 1204)  Complications: No notable events documented.

## 2023-04-14 NOTE — Anesthesia Postprocedure Evaluation (Signed)
Anesthesia Post Note  Patient: Elijah Taylor  Procedure(s) Performed: RELEASE TRIGGER FINGER/A-1 PULLEY LEFT SMALL FINGER (Left: Finger)     Patient location during evaluation: PACU Anesthesia Type: MAC Level of consciousness: awake Pain management: pain level controlled Vital Signs Assessment: post-procedure vital signs reviewed and stable Respiratory status: spontaneous breathing, nonlabored ventilation and respiratory function stable Cardiovascular status: stable and blood pressure returned to baseline Postop Assessment: no apparent nausea or vomiting Anesthetic complications: no   No notable events documented.  Last Vitals:  Vitals:   04/14/23 1430 04/14/23 1442  BP: 113/70 120/75  Pulse: 81 74  Resp: 18 12  Temp:    SpO2: 96% 96%    Last Pain:  Vitals:   04/14/23 1442  TempSrc:   PainSc: 0-No pain                 Linton Rump

## 2023-04-14 NOTE — Op Note (Addendum)
04/14/2023 [facility]  Operative Note  PREOPERATIVE DIAGNOSIS: LEFT SMALL FINGER TRIGGER DIGIT  POSTOPERATIVE DIAGNOSIS:  LEFT SMALL FINGER TRIGGER DIGIT  PROCEDURE: Procedure(s): RELEASE TRIGGER FINGER/A-1 PULLEY LEFT SMALL FINGER   SURGEON:  Betha Loa, MD  ASSISTANT:  none.  ANESTHESIA:  Bier block with sedation.  IV FLUIDS:  Per anesthesia flow sheet.  ESTIMATED BLOOD LOSS:  Minimal.  COMPLICATIONS:  None.  SPECIMENS:  None.  TOURNIQUET TIME:  Total Tourniquet Time Documented: Forearm (Left) - 18 minutes Total: Forearm (Left) - 18 minutes   DISPOSITION:  Stable to PACU.  LOCATION: [facility]  INDICATIONS: Elijah Taylor is a 72 y.o. male with triggering of the little finger.  He wishes to forego injections.  He wishes to proceed with surgical trigger release.  Risks, benefits and alternatives of surgery were discussed including the risk of blood loss, infection, damage to nerves, vessels, tendons, ligaments, bone, failure of surgery, need for additional surgery, complications with wound healing, continued pain, continued triggering and need for repeat surgery.  He voiced understanding of these risks and elected to proceed.  OPERATIVE COURSE:  After being identified preoperatively by myself, the patient and I agreed upon the procedure and site of procedure.  The surgical site was marked. Surgical consent had been signed. He was given IV Ancef as preoperative antibiotic prophylaxis. He was transported to the operating room and placed on the operating room table in supine position with the Left upper extremity on an arm board. Bier block anesthesia was induced by the anesthesiologist.  The Left upper extremity was prepped and draped in normal sterile orthopedic fashion. A surgical pause was performed between surgeons, anesthesia, and operating room staff, and all were in agreement as to the patient, procedure, and site of procedure.  Tourniquet  at the proximal aspect of the forearm had been inflated for the Bier block.  An incision was made at the volar aspect of the MP joint of the little finger.  This was carried into the subcutaneous tissues by spreading technique.  Bipolar electrocautery was used to obtain hemostasis.  The radial and ulnar digital nerves were protected throughout the case. The flexor sheath was identified.  The A1 pulley was identified and sharply incised.  It was released in its entirety.  The proximal 1-2 mm of the A2 pulley was vented to allow better excursion of the tendons.  The finger was placed through a range of motion and there was noted to be no catching.  The tendons were brought through the wound and any adherences released.  There was some tenosynovitis that was removed from the tendon.  The wound was then copiously irrigated with sterile saline. It was closed with 4-0 nylon in a horizontal mattress fashion.  It was injected with 0.25% plain Marcaine to aid in postoperative analgesia.  It was dressed with sterile Xeroform, 4x4s, and wrapped lightly with a Coban dressing.  Tourniquet was deflated at 18 minutes.  The fingertips were pink with brisk capillary refill after deflation of the tourniquet.  The operative drapes were broken down and the patient was awoken from anesthesia safely.  He was transferred back to the stretcher and taken to the PACU in stable condition.   I will see him back in the office in 1 week for postoperative followup.  I will give him a prescription for tylenol #3 1 PO q6 hours prn pain, dispense #15.    Betha Loa, MD Electronically signed, 04/14/23

## 2023-04-14 NOTE — Discharge Instructions (Addendum)
[facility] Instructions °Hand Surgery ° °Wound Care: °Keep your hand elevated above the level of your heart.  Do not allow it to dangle by your side.  Keep the dressing dry and do not remove it unless your doctor advises you to do so.  He will usually change it at the time of your post-op visit.  Moving your fingers is advised to stimulate circulation but will depend on the site of your surgery.  If you have a splint applied, your doctor will advise you regarding movement. ° °Activity: °Do not drive or operate machinery today.  Rest today and then you may return to your normal activity and work as indicated by your physician. ° °Diet:  °Drink liquids today or eat a light diet.  You may resume a regular diet tomorrow.   ° °General expectations: °Pain for two to three days. °Fingers may become slightly swollen. ° °Call your doctor if any of the following occur: °Severe pain not relieved by pain medication. °Elevated temperature. °Dressing soaked with blood. °Inability to move fingers. °White or bluish color to fingers. ° ° °Post Anesthesia Home Care Instructions ° °Activity: °Get plenty of rest for the remainder of the day. A responsible individual must stay with you for 24 hours following the procedure.  °For the next 24 hours, DO NOT: °-Drive a car °-Operate machinery °-Drink alcoholic beverages °-Take any medication unless instructed by your physician °-Make any legal decisions or sign important papers. ° °Meals: °Start with liquid foods such as gelatin or soup. Progress to regular foods as tolerated. Avoid greasy, spicy, heavy foods. If nausea and/or vomiting occur, drink only clear liquids until the nausea and/or vomiting subsides. Call your physician if vomiting continues. ° °Special Instructions/Symptoms: °Your throat may feel dry or sore from the anesthesia or the breathing tube placed in your throat during surgery. If this causes discomfort, gargle with warm salt water. The discomfort should disappear within  24 hours. ° °If you had a scopolamine patch placed behind your ear for the management of post- operative nausea and/or vomiting: ° °1. The medication in the patch is effective for 72 hours, after which it should be removed.  Wrap patch in a tissue and discard in the trash. Wash hands thoroughly with soap and water. °2. You may remove the patch earlier than 72 hours if you experience unpleasant side effects which may include dry mouth, dizziness or visual disturbances. °3. Avoid touching the patch. Wash your hands with soap and water after contact with the patch. °  ° ° °

## 2023-04-14 NOTE — Anesthesia Procedure Notes (Signed)
Anesthesia Regional Block: Bier block (IV Regional)   Pre-Anesthetic Checklist: , timeout performed,  Correct Patient, Correct Site, Correct Laterality,  Correct Procedure,, site marked,  Surgical consent,  At surgeon's request  Laterality: Left         Needles:  Injection technique: Single-shot  Needle Type: Other      Needle Gauge: 22     Additional Needles:   Procedures:,,,,, intact distal pulses, Esmarch exsanguination,  Single tourniquet utilized    Narrative:  Start time: 04/14/2023 2:03 PM End time: 04/14/2023 2:03 PM  Performed by: Personally

## 2023-04-14 NOTE — H&P (Signed)
Elijah Taylor is an 72 y.o. male.   Chief Complaint: trigger digit HPI: 72 yo with triggering left small finger.  He has had previous trigger digits requiring surgical release.  He wishes to have surgical release of left small finger.  Allergies: No Known Allergies  Past Medical History:  Diagnosis Date   Arthritis    History of kidney stones    Insomnia    Prostate cancer (HCC)    Urinary bladder incontinence    Related to Prostectomy   Wears glasses     Past Surgical History:  Procedure Laterality Date    bilateral rotator cuff surgery      ANTERIOR CERVICAL DECOMP/DISCECTOMY FUSION N/A 05/01/2020   Procedure: Cervical Four-Five Anterior Cervical Decompression/Discectomy/Fusion with removal of Cervical Three-Four Hardware;  Surgeon: Tia Alert, MD;  Location: [facility] OR;  Service: Neurosurgery;  Laterality: N/A;   CARPAL TUNNEL RELEASE     left   CARPAL TUNNEL RELEASE Right 12/01/2014   Procedure: RIGHT CARPAL TUNNEL RELEASE;  Surgeon: Cindee Salt, MD;  Location: [facility];  Service: Orthopedics;  Laterality: Right;   CARPAL TUNNEL RELEASE Left 01/11/2022   Procedure: LEFT CARPAL TUNNEL RELEASE;  Surgeon: Betha Loa, MD;  Location: [facility];  Service: Orthopedics;  Laterality: Left;   CATARACT EXTRACTION W/ INTRAOCULAR LENS IMPLANT Right    CATARACT EXTRACTION W/ INTRAOCULAR LENS IMPLANT Right    CERVICAL FUSION  2007   COLONOSCOPY     COLONOSCOPY WITH PROPOFOL N/A 11/07/2017   Procedure: COLONOSCOPY WITH PROPOFOL;  Surgeon: [name], MD;  Location: [facility] END[facility]COPY;  Service: Gastroenterology;  Laterality: N/A;   COLONOSCOPY WITH PROPOFOL N/A 11/14/2022   Procedure: COLONOSCOPY WITH PROPOFOL;  Surgeon: [name], MD;  Location: [facility];  Service: Gastroenterology;  Laterality: N/A;   CYSTOSCOPY MACROPLASTIQUE IMPLANT N/A 09/15/2015   Procedure: CYSTOSCOPY MACROPLASTIQUE IMPLANT;  Surgeon: Orson Ape, MD;  Location: [facility] ORS;   Service: Urology;  Laterality: N/A;   CYSTOSCOPY/URETEROSCOPY/HOLMIUM LASER/STENT PLACEMENT Left 06/29/2018   Procedure: CYSTOSCOPY/URETEROSCOPY/HOLMIUM LASER/STENT PLACEMENT;  Surgeon: Sondra Come, MD;  Location: [facility];  Service: Urology;  Laterality: Left;   EXTRACORPOREAL SHOCK WAVE LITHOTRIPSY Left 07/12/2018   Procedure: EXTRACORPOREAL SHOCK WAVE LITHOTRIPSY (ESWL);  Surgeon: Sondra Come, MD;  Location: [facility]S;  Service: Urology;  Laterality: Left;   EXTRACORPOREAL SHOCK WAVE LITHOTRIPSY Right 10/18/2018   Procedure: EXTRACORPOREAL SHOCK WAVE LITHOTRIPSY (ESWL);  Surgeon: Orson Ape, MD;  Location: [facility] ORS;  Service: Urology;  Laterality: Right;   lower back surgery      x 2   reconstructive thumb surgery      RECTAL EXAM UNDER ANESTHESIA N/A 12/26/2022   Procedure: ANORECTAL EXAM UNDER ANESTHESIA;  Surgeon: Andria Meuse, MD;  Location: [facility];  Service: General;  Laterality: N/A;   RETROPUBIC PROSTATECTOMY  2010   SHOULDER ARTHROSCOPY W/ ROTATOR CUFF REPAIR     right and left   TRANSANAL EXCISION OF RECTAL MASS N/A 12/26/2022   Procedure: TRANSANAL EXCISION OF DISTAL RECTAL POLYP;  Surgeon: Andria Meuse, MD;  Location: [facility];  Service: General;  Laterality: N/A;   TRIGGER FINGER RELEASE     x 4   TRIGGER FINGER RELEASE Left 09/02/2014   Procedure: RELEASE A-1 PULLEY LEFT INDEX FINGER;  Surgeon: Cindee Salt, MD;  Location: [facility];  Service: Orthopedics;  Laterality: Left;  ANESTHESIA: IV REGIONAL FAB   TRIGGER FINGER RELEASE Right 05/16/2017   Procedure: RELEASE TRIGGER FINGER/A-1  PULLEY RIGHT MIDDLE FINGER;  Surgeon: Cindee Salt, MD;  Location: [facility];  Service: Orthopedics;  Laterality: Right;   TRIGGER FINGER RELEASE Right 03/28/2019   Procedure: RELEASE TRIGGER FINGER/A-1 PULLEY RIGHT INDEX;  Surgeon: Cindee Salt, MD;  Location: [facility];  Service: Orthopedics;  Laterality: Right;  FAB     Family History: Family History  Problem Relation Age of Onset   Cancer Mother        Brain Tumor   Kidney failure Father    Prostate cancer Father    Heart disease Brother    Heart attack Maternal Grandfather    Prostate cancer Maternal Uncle    Prostate cancer Maternal Uncle     Social History:   reports that he has never smoked. He has never used smokeless tobacco. He reports current alcohol use of about 1.0 standard drink of alcohol per week. He reports that he does not use drugs.  Medications: Medications Prior to Admission  Medication Sig Dispense Refill   aspirin EC 81 MG tablet Take 81 mg by mouth daily with supper.     diphenhydramine-acetaminophen (TYLENOL PM) 25-500 MG TABS tablet Take 1 tablet by mouth at bedtime.     Multiple Vitamins-Minerals (MULTIVITAMIN WITH MINERALS) tablet Take 1 tablet by mouth daily with supper.     triazolam (HALCION) 0.125 MG tablet Take 0.25 mg by mouth at bedtime as needed for sleep.     diphenhydrAMINE HCl (ZZZQUIL) 50 MG/30ML LIQD Take 25 mg by mouth at bedtime.      No results found for this or any previous visit (from the past 48 hour(s)).  No results found.    Blood pressure (!) 145/83, pulse 92, temperature 98.1 F (36.7 C), temperature source Oral, resp. rate 16, height 5\' 9"  (1.753 m), weight 82.5 kg, SpO2 96%.  General appearance: alert, cooperative, and appears stated age Head: Normocephalic, without obvious abnormality, atraumatic Neck: supple, symmetrical, trachea midline Extremities: Intact sensation and capillary refill all digits.  +epl/fpl/io.  No wounds.  Pulses: 2+ and symmetric Skin: Skin color, texture, turgor normal. No rashes or lesions Neurologic: Grossly normal Incision/Wound: none  Assessment/Plan Left small finger trigger digit.  Non operative and operative treatment options have been discussed with the patient and patient wishes to proceed with operative treatment. Risks, benefits, and alternatives  of surgery have been discussed and the patient agrees with the plan of care.   Betha Loa 04/14/2023, 12:56 PM

## 2023-04-14 NOTE — Anesthesia Preprocedure Evaluation (Signed)
Anesthesia Evaluation  Patient identified by MRN, date of birth, ID band Patient awake    Reviewed: Allergy & Precautions, H&P , NPO status , Patient's Chart, lab work & pertinent test results  Airway Mallampati: II   Neck ROM: full    Dental   Pulmonary neg pulmonary ROS   breath sounds clear to auscultation       Cardiovascular negative cardio ROS  Rhythm:regular Rate:Normal     Neuro/Psych    GI/Hepatic   Endo/Other    Renal/GU      Musculoskeletal  (+) Arthritis ,    Abdominal   Peds  Hematology   Anesthesia Other Findings   Reproductive/Obstetrics                             Anesthesia Physical Anesthesia Plan  ASA: 2  Anesthesia Plan: MAC and Bier Block and Bier Block-Lidocaine Only   Post-op Pain Management:    Induction: Intravenous  PONV Risk Score and Plan: 1 and Propofol infusion and Treatment may vary due to age or medical condition  Airway Management Planned: Simple Face Mask  Additional Equipment:   Intra-op Plan:   Post-operative Plan:   Informed Consent: I have reviewed the patients History and Physical, chart, labs and discussed the procedure including the risks, benefits and alternatives for the proposed anesthesia with the patient or authorized representative who has indicated his/her understanding and acceptance.     Dental advisory given  Plan Discussed with: CRNA, Surgeon and Anesthesiologist  Anesthesia Plan Comments:        Anesthesia Quick Evaluation

## 2023-04-17 ENCOUNTER — Encounter (HOSPITAL_BASED_OUTPATIENT_CLINIC_OR_DEPARTMENT_OTHER): Payer: Self-pay | Admitting: Orthopedic Surgery

## 2023-04-21 DIAGNOSIS — M65352 Trigger finger, left little finger: Secondary | ICD-10-CM | POA: Diagnosis not present

## 2023-04-25 DIAGNOSIS — K6282 Dysplasia of anus: Secondary | ICD-10-CM | POA: Diagnosis not present

## 2023-04-28 DIAGNOSIS — M65352 Trigger finger, left little finger: Secondary | ICD-10-CM | POA: Diagnosis not present

## 2023-06-23 ENCOUNTER — Ambulatory Visit (INDEPENDENT_AMBULATORY_CARE_PROVIDER_SITE_OTHER): Payer: Medicare Other | Admitting: Family Medicine

## 2023-06-23 ENCOUNTER — Encounter: Payer: Self-pay | Admitting: Family Medicine

## 2023-06-23 VITALS — BP 125/70 | HR 91 | Ht 69.0 in | Wt 187.4 lb

## 2023-06-23 DIAGNOSIS — E7800 Pure hypercholesterolemia, unspecified: Secondary | ICD-10-CM | POA: Diagnosis not present

## 2023-06-23 DIAGNOSIS — Z0000 Encounter for general adult medical examination without abnormal findings: Secondary | ICD-10-CM | POA: Diagnosis not present

## 2023-06-23 DIAGNOSIS — Z79899 Other long term (current) drug therapy: Secondary | ICD-10-CM | POA: Diagnosis not present

## 2023-06-23 DIAGNOSIS — G4700 Insomnia, unspecified: Secondary | ICD-10-CM | POA: Diagnosis not present

## 2023-06-23 DIAGNOSIS — C61 Malignant neoplasm of prostate: Secondary | ICD-10-CM

## 2023-06-23 MED ORDER — TRIAZOLAM 0.25 MG PO TABS: 0.2500 mg | ORAL_TABLET | Freq: Every evening | ORAL | 2 refills | Status: AC | PRN

## 2023-06-23 NOTE — Assessment & Plan Note (Signed)
Under good control on current regimen. Continue current regimen. Continue to monitor. Call with any concerns. Refills given, should last 1 year.

## 2023-06-23 NOTE — Assessment & Plan Note (Signed)
Labs drawn today. Await results. No longer seeing urology.

## 2023-06-23 NOTE — Assessment & Plan Note (Signed)
For Triazolam- see scanned document.

## 2023-06-23 NOTE — Assessment & Plan Note (Signed)
Rechecking labs today. Await results. Treat as needed.  °

## 2023-06-23 NOTE — Progress Notes (Signed)
BP 125/70   Pulse 91   Ht 5\' 9"  (1.753 m)   Wt 187 lb 6.4 oz (85 kg)   SpO2 96%   BMI 27.67 kg/m    Subjective:    Patient ID: Elijah Taylor, male    DOB: 14-Aug-1950, 72 y.o.   MRN: [id number]  HPI: Elijah Taylor is a 72 y.o. male presenting on 06/23/2023 for comprehensive medical examination. Current medical complaints include:  HYPERLIPIDEMIA Hyperlipidemia status: Stable Satisfied with current treatment?  yes Side effects:  N/A Past cholesterol meds: None Supplements: none Aspirin:  yes The 10-year ASCVD risk score (Arnett DK, et al., 2019) is: 24.1%   Values used to calculate the score:     Age: 74 years     Sex: Male     Is Non-Hispanic African American: No     Diabetic: No     Tobacco smoker: No     Systolic Blood Pressure: 125 mmHg     Is BP treated: No     HDL Cholesterol: 41 mg/dL     Total Cholesterol: 202 mg/dL Chest pain:  no Coronary artery disease:  no  INSOMNIA- had been getting triazolam from Dr. Angela Adam for years, but hasn't had any since he retired.  Duration: chronic Satisfied with sleep quality: yes Difficulty falling asleep: no Difficulty staying asleep: no Waking a few hours after sleep onset: no Early morning awakenings: no Daytime hypersomnolence: no Wakes feeling refreshed: yes Good sleep hygiene: yes Apnea: no Snoring: no Depressed/anxious mood: no Recent stress: no Restless legs/nocturnal leg cramps: no Chronic pain/arthritis: no History of sleep study: no Treatments attempted:  triazolam, melatonin, uinsom, and benadryl    He currently lives with: wife Interim Problems from his last visit: no  Functional Status Survey: Is the patient deaf or have difficulty hearing?: No Does the patient have difficulty seeing, even when wearing glasses/contacts?: No Does the patient have difficulty concentrating, remembering, or making decisions?: Yes Does the patient have difficulty walking or climbing stairs?: No Does the patient  have difficulty dressing or bathing?: No Does the patient have difficulty doing errands alone such as visiting a doctor's office or shopping?: No  FALL RISK:    06/23/2023    8:11 AM 02/02/2023    1:44 PM 06/22/2022    9:48 AM 08/19/2019    9:07 AM 03/11/2019    3:03 PM  Fall Risk   Falls in the past year? 0 0 0 0 --  Comment     Emmi Telephone Survey: data to providers prior to load  Number falls in past yr: 0 0 0 0 --  Comment     Emmi Telephone Survey Actual Response =   Injury with Fall? 0 0 0 0   Risk for fall due to : No Fall Risks No Fall Risks No Fall Risks    Follow up Falls evaluation completed Falls evaluation completed Falls evaluation completed      Depression Screen    06/23/2023    8:12 AM 02/02/2023    1:44 PM 06/22/2022    9:49 AM 08/19/2019    9:07 AM 10/30/2017   10:50 AM  Depression screen PHQ 2/9  Decreased Interest 0 0 0 0 0  Down, Depressed, Hopeless 0 0 0 0 0  PHQ - 2 Score 0 0 0 0 0  Altered sleeping 3 2 1   0  Tired, decreased energy 0 0 0  0  Change in appetite 0 0 0  0  Feeling bad or failure about yourself  0 0 0  0  Trouble concentrating 0 0 0  0  Moving slowly or fidgety/restless 0 0 0  0  Suicidal thoughts 0 0 0  0  PHQ-9 Score 3 2 1   0  Difficult doing work/chores Not difficult at all Not difficult at all       Advanced Directives Does patient have a HCPOA?    no If yes, name and contact information:  Does patient have a living will or MOST form?  no  Past Medical History:  Past Medical History:  Diagnosis Date   Arthritis    History of kidney stones    Insomnia    Prostate cancer (HCC)    Urinary bladder incontinence    Related to Prostectomy   Wears glasses     Surgical History:  Past Surgical History:  Procedure Laterality Date    bilateral rotator cuff surgery      ANTERIOR CERVICAL DECOMP/DISCECTOMY FUSION N/A 05/01/2020   Procedure: Cervical Four-Five Anterior Cervical Decompression/Discectomy/Fusion with removal of  Cervical Three-Four Hardware;  Surgeon: Tia Alert, MD;  Location: [facility];  Service: Neurosurgery;  Laterality: N/A;   CARPAL TUNNEL RELEASE     left   CARPAL TUNNEL RELEASE Right 12/01/2014   Procedure: RIGHT CARPAL TUNNEL RELEASE;  Surgeon: Cindee Salt, MD;  Location: [facility];  Service: Orthopedics;  Laterality: Right;   CARPAL TUNNEL RELEASE Left 01/11/2022   Procedure: LEFT CARPAL TUNNEL RELEASE;  Surgeon: Betha Loa, MD;  Location:  SURGERY CENTER;  Service: Orthopedics;  Laterality: Left;   CATARACT EXTRACTION W/ INTRAOCULAR LENS IMPLANT Right    CATARACT EXTRACTION W/ INTRAOCULAR LENS IMPLANT Right    CERVICAL FUSION  2007   COLONOSCOPY     COLONOSCOPY WITH PROPOFOL N/A 11/07/2017   Procedure: COLONOSCOPY WITH PROPOFOL;  Surgeon: [name], MD;  Location: [facility] ENDOSCOPY;  Service: Gastroenterology;  Laterality: N/A;   COLONOSCOPY WITH PROPOFOL N/A 11/14/2022   Procedure: COLONOSCOPY WITH PROPOFOL;  Surgeon: [name], MD;  Location: [facility] ENDOSCOPY;  Service: Gastroenterology;  Laterality: N/A;   CYSTOSCOPY MACROPLASTIQUE IMPLANT N/A 09/15/2015   Procedure: CYSTOSCOPY MACROPLASTIQUE IMPLANT;  Surgeon: Orson Ape, MD;  Location: [facility] ORS;  Service: Urology;  Laterality: N/A;   CYSTOSCOPY/URETEROSCOPY/HOLMIUM LASER/STENT PLACEMENT Left 06/29/2018   Procedure: CYSTOSCOPY/URETEROSCOPY/HOLMIUM LASER/STENT PLACEMENT;  Surgeon: Sondra Come, MD;  Location: [facility] ORS;  Service: Urology;  Laterality: Left;   EXTRACORPOREAL SHOCK WAVE LITHOTRIPSY Left 07/12/2018   Procedure: EXTRACORPOREAL SHOCK WAVE LITHOTRIPSY (ESWL);  Surgeon: Sondra Come, MD;  Location: [facility] ORS;  Service: Urology;  Laterality: Left;   EXTRACORPOREAL SHOCK WAVE LITHOTRIPSY Right 10/18/2018   Procedure: EXTRACORPOREAL SHOCK WAVE LITHOTRIPSY (ESWL);  Surgeon: Orson Ape, MD;  Location: [facility] ORS;  Service: Urology;  Laterality: Right;   lower back surgery      x 2    reconstructive thumb surgery      RECTAL EXAM UNDER ANESTHESIA N/A 12/26/2022   Procedure: ANORECTAL EXAM UNDER ANESTHESIA;  Surgeon: Andria Meuse, MD;  Location: [facility];  Service: General;  Laterality: N/A;   RETROPUBIC PROSTATECTOMY  2010   SHOULDER ARTHROSCOPY W/ ROTATOR CUFF REPAIR     right and left   TRANSANAL EXCISION OF RECTAL MASS N/A 12/26/2022   Procedure: TRANSANAL EXCISION OF DISTAL RECTAL POLYP;  Surgeon: Andria Meuse, MD;  Location: [facility]S;  Service: General;  Laterality: N/A;   TRIGGER FINGER RELEASE  x 4   TRIGGER FINGER RELEASE Left 09/02/2014   Procedure: RELEASE A-1 PULLEY LEFT INDEX FINGER;  Surgeon: Cindee Salt, MD;  Location: [facility];  Service: Orthopedics;  Laterality: Left;  ANESTHESIA: IV REGIONAL FAB   TRIGGER FINGER RELEASE Right 05/16/2017   Procedure: RELEASE TRIGGER FINGER/A-1 PULLEY RIGHT MIDDLE FINGER;  Surgeon: Cindee Salt, MD;  Location: [facility];  Service: Orthopedics;  Laterality: Right;   TRIGGER FINGER RELEASE Right 03/28/2019   Procedure: RELEASE TRIGGER FINGER/A-1 PULLEY RIGHT INDEX;  Surgeon: Cindee Salt, MD;  Location: [facility];  Service: Orthopedics;  Laterality: Right;  FAB   TRIGGER FINGER RELEASE Left 04/14/2023   Procedure: RELEASE TRIGGER FINGER/A-1 PULLEY LEFT SMALL FINGER;  Surgeon: Betha Loa, MD;  Location:  SURGERY CENTER;  Service: Orthopedics;  Laterality: Left;  30 MIN    Medications:  Current Outpatient Medications on File Prior to Visit  Medication Sig   acetaminophen-codeine (TYLENOL #3) 300-30 MG tablet Take 1 tablet by mouth every 6 (six) hours as needed for moderate pain.   aspirin EC 81 MG tablet Take 81 mg by mouth daily with supper.   diphenhydramine-acetaminophen (TYLENOL PM) 25-500 MG TABS tablet Take 1 tablet by mouth at bedtime.   Multiple Vitamins-Minerals (MULTIVITAMIN WITH MINERALS) tablet Take 1 tablet by mouth daily with supper.   No  current facility-administered medications on file prior to visit.    Allergies:  No Known Allergies  Social History:  Social History   Socioeconomic History   Marital status: Married    Spouse name: Not on file   Number of children: 2   Years of education: Not on file   Highest education level: Not on file  Occupational History   Not on file  Tobacco Use   Smoking status: Never   Smokeless tobacco: Never  Vaping Use   Vaping status: Never Used  Substance and Sexual Activity   Alcohol use: Yes    Alcohol/week: 1.0 standard drink of alcohol    Types: 1 Glasses of wine per week    Comment: occasionally   Drug use: No   Sexual activity: Not Currently  Other Topics Concern   Not on file  Social History Narrative   Lives with wife; in [place]; retd. mechanic maintenance; no smoking; on alcohol    Social Determinants of Health   Financial Resource Strain: Not on file  Food Insecurity: Not on file  Transportation Needs: Not on file  Physical Activity: Not on file  Stress: Not on file  Social Connections: Not on file  Intimate Partner Violence: Not on file   Social History   Tobacco Use  Smoking Status Never  Smokeless Tobacco Never   Social History   Substance and Sexual Activity  Alcohol Use Yes   Alcohol/week: 1.0 standard drink of alcohol   Types: 1 Glasses of wine per week   Comment: occasionally    Family History:  Family History  Problem Relation Age of Onset   Cancer Mother        Brain Tumor   Kidney failure Father    Prostate cancer Father    Heart disease Brother    Heart attack Maternal Grandfather    Prostate cancer Maternal Uncle    Prostate cancer Maternal Uncle     Past medical history, surgical history, medications, allergies, family history and social history reviewed with patient today and changes made to appropriate areas of the chart.   Review of Systems  Constitutional: Negative.   HENT: Negative.    Eyes: Negative.    Respiratory: Negative.    Cardiovascular: Negative.   Gastrointestinal: Negative.   Genitourinary: Negative.   Musculoskeletal:  Positive for myalgias. Negative for back pain, falls, joint pain and neck pain.  Skin: Negative.   Neurological: Negative.   Endo/Heme/Allergies:  Positive for environmental allergies. Negative for polydipsia. Does not bruise/bleed easily.  Psychiatric/Behavioral: Negative.     All other ROS negative except what is listed above and in the HPI.      Objective:    BP 125/70   Pulse 91   Ht 5\' 9"  (1.753 m)   Wt 187 lb 6.4 oz (85 kg)   SpO2 96%   BMI 27.67 kg/m   Wt Readings from Last 3 Encounters:  06/23/23 187 lb 6.4 oz (85 kg)  04/14/23 181 lb 14.1 oz (82.5 kg)  02/02/23 186 lb (84.4 kg)    No results found.  Physical Exam Vitals and nursing note reviewed.  Constitutional:      General: He is not in acute distress.    Appearance: Normal appearance. He is not ill-appearing, toxic-appearing or diaphoretic.  HENT:     Head: Normocephalic and atraumatic.     Right Ear: Tympanic membrane, ear canal and external ear normal. There is no impacted cerumen.     Left Ear: Tympanic membrane, ear canal and external ear normal. There is no impacted cerumen.     Nose: Nose normal. No congestion or rhinorrhea.     Mouth/Throat:     Mouth: Mucous membranes are moist.     Pharynx: Oropharynx is clear. No oropharyngeal exudate or posterior oropharyngeal erythema.  Eyes:     General: No scleral icterus.       Right eye: No discharge.        Left eye: No discharge.     Extraocular Movements: Extraocular movements intact.     Conjunctiva/sclera: Conjunctivae normal.     Pupils: Pupils are equal, round, and reactive to light.  Neck:     Vascular: No carotid bruit.  Cardiovascular:     Rate and Rhythm: Normal rate and regular rhythm.     Pulses: Normal pulses.     Heart sounds: No murmur heard.    No friction rub. No gallop.  Pulmonary:     Effort:  Pulmonary effort is normal. No respiratory distress.     Breath sounds: Normal breath sounds. No stridor. No wheezing, rhonchi or rales.  Chest:     Chest wall: No tenderness.  Abdominal:     General: Abdomen is flat. Bowel sounds are normal. There is no distension.     Palpations: Abdomen is soft. There is no mass.     Tenderness: There is no abdominal tenderness. There is no right CVA tenderness, left CVA tenderness, guarding or rebound.     Hernia: No hernia is present.  Genitourinary:    Comments: Genital exam deferred with shared decision making Musculoskeletal:        General: No swelling, tenderness, deformity or signs of injury.     Cervical back: Normal range of motion and neck supple. No rigidity. No muscular tenderness.     Right lower leg: No edema.     Left lower leg: No edema.  Lymphadenopathy:     Cervical: No cervical adenopathy.  Skin:    General: Skin is warm and dry.     Capillary Refill: Capillary refill takes less than 2 seconds.  Coloration: Skin is not jaundiced or pale.     Findings: No bruising, erythema, lesion or rash.  Neurological:     General: No focal deficit present.     Mental Status: He is alert and oriented to person, place, and time.     Cranial Nerves: No cranial nerve deficit.     Sensory: No sensory deficit.     Motor: No weakness.     Coordination: Coordination normal.     Gait: Gait normal.     Deep Tendon Reflexes: Reflexes normal.  Psychiatric:        Mood and Affect: Mood normal.        Behavior: Behavior normal.        Thought Content: Thought content normal.        Judgment: Judgment normal.        06/23/2023    8:18 AM 06/22/2022   10:10 AM  6CIT Screen  What Year? 0 points 0 points  What month? 0 points 0 points  What time? 0 points 0 points  Count back from 20 0 points 0 points  Months in reverse 0 points 0 points  Repeat phrase 2 points 2 points  Total Score 2 points 2 points    Results for orders placed or  performed during the hospital encounter of 12/26/22  Surgical pathology  Result Value Ref Range   SURGICAL PATHOLOGY      SURGICAL PATHOLOGY CASE: [id number] PATIENT: Edmonia Caprio Surgical Pathology Report     Clinical History: high grade squamous intraepithelial lesion     FINAL MICROSCOPIC DIAGNOSIS:  A. 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      Distal and anterior margins are positive for high-grade dysplasia. Reminder surgical margins of resection are negative for dysplasia. Negative for invasive carcinoma. See comment.  COMMENT:  Immunohistochemical stains for p16 were performed and shows block like pattern of staining. The finding confirms the presence of high-grade intraepithelial lesion and distal margin with high-grade dysplasia.  Controls worked appropriately.   GROSS DESCRIPTION:  Received fresh is a pink-tan polypoid nodule oriented with pins as follows: "Pink proximal, red right, blue distal, green anterior".  The margins are inked as follows: Orange = proximal, Red = right, blue = distal,  Green = anterior, and deep = black.  The specimen measures 3.0 cm from anterior to right, 1.6 cm from proximal to distal, and is excised to depth of 0.9 cm.  There is a 1.9 x 0.8 cm tan nodule that is 0.3 cm from the nearest margin (distal).  The specimen is serially sectioned revealing a tan underlying cut surface.  The specimen is entirely and sequentially submitted from the right to anterior direction as follows: A1 perpendicular sections of right margin A2-A6 cross-sections A7 perpendicular sections of anterior margin Renette Butters, 12/27/2022)   Final Diagnosis performed by Lance Coon, MD.   Electronically signed 12/29/2022 Technical component performed at [facility], [address] [place], [place] [address].  Professional component performed at [employer]. [facility], [address] [place], [place] [address].  Immunohistochemistry Technical component (if applicable) was performed at [facility]. [address], [address], [place], [place] [address].   IMMUNOHISTOCHEMISTRY DISCLAIMER (if applicable): Some of these immunohistochemical stains may have been developed and the performance characteristics determine by [facility]. Some may not have been cleared or approved by the U.S. Food and Drug  Administration. The FDA has determined that such clearance or approval is not necessary. This test is used for clinical purposes. It should not be regarded as investigational or for research. This laboratory is certified under the Clinical Laboratory Improvement Amendments of 1988 (CLIA-88) as qualified to perform high complexity clinical laboratory testing.  The controls stained appropriately.       Assessment & Plan:   Problem List Items Addressed This Visit       Genitourinary   Prostate cancer (HCC)    Labs drawn today. Await results. No longer seeing urology.      Relevant Orders   CBC with Differential/Platelet   PSA     Other   Hypercholesteremia    Rechecking labs today. Await results. Treat as needed.       Relevant Orders   Comprehensive metabolic panel   Lipid Panel w/o Chol/HDL Ratio   Insomnia    Under good control on current regimen. Continue current regimen. Continue to monitor. Call with any concerns. Refills given, should last 1 year.       Controlled substance agreement signed    For Triazolam- see scanned document.      Other Visit Diagnoses     Encounter for annual wellness exam in Medicare patient    -  Primary   Preventative care discussed today as below. Call with any concerns.        Preventative Services:  Health Risk Assessment and Personalized Prevention Plan: Done today Bone Mass Measurements: N/A CVD Screening: Done today Colon Cancer Screening: up to  date Depression Screening:  done today Diabetes Screening: done today Glaucoma Screening: See your eye doctor Hepatitis B vaccine N/A Hepatitis C screening: up to date HIV Screening: up to date Flu Vaccine: declined Lung cancer Screening:N/A Obesity Screening: done today Pneumonia Vaccines (2): up to date STI Screening: N/A PSA screening: Done today  Discussed aspirin prophylaxis for myocardial infarction prevention and decision was made to continue ASA  LABORATORY TESTING:  Health maintenance labs ordered today as discussed above.   The natural history of prostate cancer and ongoing controversy regarding screening and potential treatment outcomes of prostate cancer has been discussed with the patient. The meaning of a false positive PSA and a false negative PSA has been discussed. He indicates understanding of the limitations of this screening test and wishes to proceed with screening PSA testing.   IMMUNIZATIONS:   - Tdap: Tetanus vaccination status reviewed: last tetanus booster within 10 years. - Influenza: Refused - Pneumovax: Up to date - Prevnar: Up to date - Zostavax vaccine: Refused  SCREENING: - Colonoscopy: Up to date  Discussed with patient purpose of the colonoscopy is to detect colon cancer at curable precancerous or early stages   PATIENT COUNSELING:    Sexuality: Discussed sexually transmitted diseases, partner selection, use of condoms, avoidance of unintended pregnancy  and contraceptive alternatives.   Advised to avoid cigarette smoking.  I discussed with the patient that most people either abstain from alcohol or drink within safe limits (<=14/week and <=4 drinks/occasion for males, <=7/weeks and <= 3 drinks/occasion for females) and that the risk for alcohol disorders and other health effects rises proportionally with the number of drinks per week and how often a drinker exceeds daily limits.  Discussed cessation/primary prevention of drug use and  availability of treatment for abuse.   Diet: Encouraged to adjust caloric intake to maintain  or achieve ideal body weight, to reduce intake of dietary saturated fat and total fat,  to limit sodium intake by avoiding high sodium foods and not adding table salt, and to maintain adequate dietary potassium and calcium preferably from fresh fruits, vegetables, and low-fat dairy products.    stressed the importance of regular exercise  Injury prevention: Discussed safety belts, safety helmets, smoke detector, smoking near bedding or upholstery.   Dental health: Discussed importance of regular tooth brushing, flossing, and dental visits.   Follow up plan: NEXT PREVENTATIVE PHYSICAL DUE IN 1 YEAR. Return in about 1 year (around 06/22/2024) for AWV and follow up with me.

## 2023-06-23 NOTE — Patient Instructions (Signed)
Preventative Services:  Health Risk Assessment and Personalized Prevention Plan: Done today Bone Mass Measurements: N/A CVD Screening: Done today Colon Cancer Screening: up to date Depression Screening:  done today Diabetes Screening: done today Glaucoma Screening: See your eye doctor Hepatitis B vaccine N/A Hepatitis C screening: up to date HIV Screening: up to date Flu Vaccine: declined Lung cancer Screening:N/A Obesity Screening: done today Pneumonia Vaccines (2): up to date STI Screening: N/A PSA screening: Done today

## 2023-06-24 LAB — COMPREHENSIVE METABOLIC PANEL
ALT: 100 [IU]/L — ABNORMAL HIGH (ref 0–44)
AST: 66 [IU]/L — ABNORMAL HIGH (ref 0–40)
Albumin: 4.2 g/dL (ref 3.8–4.8)
Alkaline Phosphatase: 70 [IU]/L (ref 44–121)
BUN/Creatinine Ratio: 14 (ref 10–24)
BUN: 17 mg/dL (ref 8–27)
Bilirubin Total: 0.6 mg/dL (ref 0.0–1.2)
CO2: 23 mmol/L (ref 20–29)
Calcium: 9.7 mg/dL (ref 8.6–10.2)
Chloride: 107 mmol/L — ABNORMAL HIGH (ref 96–106)
Creatinine, Ser: 1.25 mg/dL (ref 0.76–1.27)
Globulin, Total: 2.8 g/dL (ref 1.5–4.5)
Glucose: 104 mg/dL — ABNORMAL HIGH (ref 70–99)
Potassium: 4.2 mmol/L (ref 3.5–5.2)
Sodium: 142 mmol/L (ref 134–144)
Total Protein: 7 g/dL (ref 6.0–8.5)
eGFR: 60 mL/min/{1.73_m2} (ref >59)

## 2023-06-24 LAB — CBC WITH DIFFERENTIAL/PLATELET
Basophils Absolute: 0.1 10*3/uL (ref 0.0–0.2)
Basos: 1 %
EOS (ABSOLUTE): 0.2 10*3/uL (ref 0.0–0.4)
Eos: 3 %
Hematocrit: 49.6 % (ref 37.5–51.0)
Hemoglobin: 16.3 g/dL (ref 13.0–17.7)
Immature Grans (Abs): 0 10*3/uL (ref 0.0–0.1)
Immature Granulocytes: 0 %
Lymphocytes Absolute: 2.9 10*3/uL (ref 0.7–3.1)
Lymphs: 42 %
MCH: 32 pg (ref 26.6–33.0)
MCHC: 32.9 g/dL (ref 31.5–35.7)
MCV: 97 fL (ref 79–97)
Monocytes Absolute: 0.9 10*3/uL (ref 0.1–0.9)
Monocytes: 13 %
Neutrophils Absolute: 2.8 10*3/uL (ref 1.4–7.0)
Neutrophils: 41 %
Platelets: 223 10*3/uL (ref 150–450)
RBC: 5.09 x10E6/uL (ref 4.14–5.80)
RDW: 12.6 % (ref 11.6–15.4)
WBC: 6.8 10*3/uL (ref 3.4–10.8)

## 2023-06-24 LAB — LIPID PANEL W/O CHOL/HDL RATIO
Cholesterol, Total: 183 mg/dL (ref 100–199)
HDL: 38 mg/dL — ABNORMAL LOW (ref >39)
LDL Chol Calc (NIH): 127 mg/dL — ABNORMAL HIGH (ref 0–99)
Triglycerides: 99 mg/dL (ref 0–149)
VLDL Cholesterol Cal: 18 mg/dL (ref 5–40)

## 2023-06-24 LAB — PSA: Prostate Specific Ag, Serum: <0.1 ng/mL (ref 0.0–4.0)

## 2023-07-25 DIAGNOSIS — K6282 Dysplasia of anus: Secondary | ICD-10-CM | POA: Diagnosis not present

## 2023-08-24 ENCOUNTER — Ambulatory Visit (INDEPENDENT_AMBULATORY_CARE_PROVIDER_SITE_OTHER): Payer: Medicare Other | Admitting: Dermatology

## 2023-08-24 ENCOUNTER — Encounter: Payer: Self-pay | Admitting: Dermatology

## 2023-08-24 DIAGNOSIS — L570 Actinic keratosis: Secondary | ICD-10-CM | POA: Diagnosis not present

## 2023-08-24 DIAGNOSIS — L720 Epidermal cyst: Secondary | ICD-10-CM

## 2023-08-24 DIAGNOSIS — L219 Seborrheic dermatitis, unspecified: Secondary | ICD-10-CM | POA: Diagnosis not present

## 2023-08-24 DIAGNOSIS — W908XXA Exposure to other nonionizing radiation, initial encounter: Secondary | ICD-10-CM

## 2023-08-24 NOTE — Progress Notes (Signed)
New Patient Visit   Subjective  Elijah Taylor is a 73 y.o. male who presents for the following:  check spots R nose, R cheek, L medial canthus area, not sure how long he has had, scaling in beard, no hx of skin caner, no fhx of skin cancer The patient has spots, moles and lesions to be evaluated, some may be new or changing and the patient may have concern these could be cancer.   The following portions of the chart were reviewed this encounter and updated as appropriate: medications, allergies, medical history  Review of Systems:  No other skin or systemic complaints except as noted in HPI or Assessment and Plan.  Objective  Well appearing patient in no apparent distress; mood and affect are within normal limits.   A focused examination was performed of the following areas: face  Relevant exam findings are noted in the Assessment and Plan.  R nasal sidewall x 1, R cheek x 2 (3) Pink scaly macules  Assessment & Plan   SEBORRHEIC DERMATITIS face Exam: erythema and scale beard area  Chronic and persistent condition with duration or expected duration over one year. Condition is symptomatic / bothersome to patient. Not to goal.   Seborrheic Dermatitis is a chronic persistent rash characterized by pinkness and scaling most commonly of the mid face but also can occur on the scalp (dandruff), ears; mid chest, mid back and groin.  It tends to be exacerbated by stress and cooler weather.  People who have neurologic disease may experience new onset or exacerbation of existing seborrheic dermatitis.  The condition is not curable but treatable and can be controlled.  Treatment Plan: Pt declines treatment    Milia - L medial canthus - tiny firm white papules - type of cyst - benign - sometimes these will clear with nightly OTC adapalene/Differin 0.1% gel or retinol. - may be extracted if symptomatic - observe  AK (ACTINIC KERATOSIS) (3) R nasal sidewall x 1, R cheek x 2  (3) Actinic keratoses are precancerous spots that appear secondary to cumulative UV radiation exposure/sun exposure over time. They are chronic with expected duration over 1 year. A portion of actinic keratoses will progress to squamous cell carcinoma of the skin. It is not possible to reliably predict which spots will progress to skin cancer and so treatment is recommended to prevent development of skin cancer.  Recommend daily broad spectrum sunscreen SPF 30+ to sun-exposed areas, reapply every 2 hours as needed.  Recommend staying in the shade or wearing long sleeves, sun glasses (UVA+UVB protection) and wide brim hats (4-inch brim around the entire circumference of the hat). Call for new or changing lesions.  Return if not resolved in 1 month Destruction of lesion - R nasal sidewall x 1, R cheek x 2 (3) Complexity: simple   Destruction method: cryotherapy   Informed consent: discussed and consent obtained   Timeout:  patient name, date of birth, surgical site, and procedure verified Lesion destroyed using liquid nitrogen: Yes   Region frozen until ice ball extended beyond lesion: Yes   Cryo cycles: 1 or 2. Outcome: patient tolerated procedure well with no complications   Post-procedure details: wound care instructions given   SEBORRHEIC DERMATITIS   MILIA    Return if symptoms worsen or fail to improve.  I, Ardis Rowan, RMA, am acting as scribe for Elie Goody, MD .   Documentation: I have reviewed the above documentation for accuracy and completeness, and I agree with  the above.  Elie Goody, MD

## 2023-08-24 NOTE — Patient Instructions (Addendum)
 Actinic Keratosis  What is an actinic keratosis? An actinic keratosis (plural: actinic keratoses) is growth on the surface of the skin that usually appears as a red, hard, crusty or scaly bump.   What causes actinic keratoses? Repeated prolonged sun exposure causes skin damage, especially in fair-skinned persons. Sun-damaged skin becomes dry and wrinkled and may form rough, scaly spots called actinic keratoses. These rough spots remain on the skin even though the crust or scale on top is picked off.   Why treat actinic keratoses? Actinic keratoses are not skin cancers, but because they may sometimes turn cancerous they are called "pre-cancerous". Not all will turn to skin cancer, and it usually takes several years for this to happen. Because it is much easier to treat an actinic keratosis then it is to remove a skin cancer, actinic keratoses should be treated to prevent future skin cancer.   How are actinic keratoses treated? The most common way of treating actinic keratoses is to freeze them with liquid nitrogen. Freezing causes scabbing and shedding of the sun-damaged skin. Healing after a removal usually takes two weeks, depending on the size and location of the keratosis. Hands and legs heal more slowly than the face. The skin's final appearance is usually excellent. There are several topical medications that can be used to treat actinic keratoses. These medications generally have side effects of redness, crusting, and pain. Some are used for a few days, and some for several months before the actinic keratosis is completely gone. Photodynamic therapy is another alternative to freezing actinic keratoses. This treatment is done in a physician's office. A medication is applied to the area of skin with actinic keratoses, and it is allowed to soak in for one or more hours. A special light is then applied to the skin. Side effects include redness, burning, and peeling.  How can you prevent actinic  keratoses? Protection from the sun is the best way to prevent actinic keratoses. The use of proper clothing and sunscreens can prevent the sun damage that leads to an actinic keratosis.  Unfortunately, some sun damage is permanent. Once sun damage has progressed to the point where actinic keratoses develop, new keratoses may appear even without further sun exposure. However, even in skin that is already heavily sun damaged, good sun protection can help reduce the number of actinic keratoses that will appear.      Cryotherapy Aftercare  Wash gently with soap and water everyday.   Apply Vaseline and Band-Aid daily until healed.     Due to recent changes in healthcare laws, you may see results of your pathology and/or laboratory studies on MyChart before the doctors have had a chance to review them. We understand that in some cases there may be results that are confusing or concerning to you. Please understand that not all results are received at the same time and often the doctors may need to interpret multiple results in order to provide you with the best plan of care or course of treatment. Therefore, we ask that you please give [place] 2 business days to thoroughly review all your results before contacting the office for clarification. Should we see a critical lab result, you will be contacted sooner.   If You Need Anything After Your Visit  If you have any questions or concerns for your doctor, please call our main line at [phone] and press option 4 to reach your doctor's medical assistant. If no one answers, please leave a voicemail as directed and we  will return your call as soon as possible. Messages left after 4 pm will be answered the following business day.   You may also send [place] a message via MyChart. We typically respond to MyChart messages within 1-2 business days.  For prescription refills, please ask your pharmacy to contact our office. Our fax number is [phone].  If you  have an urgent issue when the clinic is closed that cannot wait until the next business day, you can page your doctor at the number below.    Please note that while we do our best to be available for urgent issues outside of office hours, we are not available 24/7.   If you have an urgent issue and are unable to reach [place], you may choose to seek medical care at your doctor's office, retail clinic, urgent care center, or emergency room.  If you have a medical emergency, please immediately call 911 or go to the emergency department.  Pager Numbers  - Dr. Gwen Pounds: [phone]  - Dr. Roseanne Reno: [phone]  - Dr. Katrinka Blazing: [phone]   In the event of inclement weather, please call our main line at [phone] for an update on the status of any delays or closures.  Dermatology Medication Tips: Please keep the boxes that topical medications come in in order to help keep track of the instructions about where and how to use these. Pharmacies typically print the medication instructions only on the boxes and not directly on the medication tubes.   If your medication is too expensive, please contact our office at [phone] option 4 or send [place] a message through MyChart.   We are unable to tell what your co-pay for medications will be in advance as this is different depending on your insurance coverage. However, we may be able to find a substitute medication at lower cost or fill out paperwork to get insurance to cover a needed medication.   If a prior authorization is required to get your medication covered by your insurance company, please allow [place] 1-2 business days to complete this process.  Drug prices often vary depending on where the prescription is filled and some pharmacies may offer cheaper prices.  The website www.goodrx.com contains coupons for medications through different pharmacies. The prices here do not account for what the cost may be with help from insurance (it may be cheaper  with your insurance), but the website can give you the price if you did not use any insurance.  - You can print the associated coupon and take it with your prescription to the pharmacy.  - You may also stop by our office during regular business hours and pick up a GoodRx coupon card.  - If you need your prescription sent electronically to a different pharmacy, notify our office through [facility] or by phone at [phone] option 4.     Si Usted Necesita Algo Despus de Su Visita  Tambin puede enviarnos un mensaje a travs de Clinical cytogeneticist. Por lo general respondemos a los mensajes de MyChart en el transcurso de 1 a 2 das hbiles.  Para renovar recetas, por favor pida a su farmacia que se ponga en contacto con nuestra oficina. Annie Sable de fax es [place] [phone].  Si tiene un asunto urgente cuando la clnica est cerrada y que no puede esperar hasta el siguiente da hbil, puede llamar/localizar a su doctor(a) al nmero que aparece a continuacin.   Por favor, tenga en cuenta que aunque hacemos todo lo posible para estar disponibles  para asuntos urgentes fuera del horario de [place], no estamos disponibles las 24 horas del da, los 7 [address]  [address] de la [place].   Si tiene un problema urgente y no puede comunicarse con nosotros, puede optar por buscar atencin mdica  en el consultorio de su doctor(a), en una clnica privada, en un centro de atencin urgente o en una sala de emergencias.  Si tiene Engineer, drilling, por favor llame inmediatamente al 911 o vaya a la sala de emergencias.  Nmeros de bper  - Dr. Gwen Pounds: [phone]  - Dra. Roseanne Reno: [phone]  - Dr. Katrinka Blazing: [phone]   En caso de inclemencias del tiempo, por favor llame a Lacy Duverney principal al [phone] para una actualizacin sobre el [place] de cualquier retraso o cierre.  Consejos para la medicacin en dermatologa: Por favor, guarde las cajas en las que vienen los medicamentos de uso tpico para  ayudarle a seguir las instrucciones sobre dnde y cmo usarlos. Las farmacias generalmente imprimen las instrucciones del medicamento slo en las cajas y no directamente en los tubos del [place].   Si su medicamento es muy caro, por favor, pngase en contacto con Rolm Gala llamando al [phone] y presione la opcin 4 o envenos un mensaje a travs de Clinical cytogeneticist.   No podemos decirle cul ser su copago por los medicamentos por adelantado ya que esto es diferente dependiendo de la cobertura de su seguro. Sin embargo, es posible que podamos encontrar un medicamento sustituto a Audiological scientist un formulario para que el seguro cubra el medicamento que se considera necesario.   Si se requiere una autorizacin previa para que su compaa de seguros [place] su medicamento, por favor permtanos de 1 a 2 das hbiles para completar [address]  Los precios de los medicamentos varan con frecuencia dependiendo del Environmental consultant de dnde se surte la receta y alguna farmacias pueden ofrecer precios ms baratos.  El sitio web www.goodrx.com tiene cupones para medicamentos de Health and safety inspector. Los precios aqu no tienen en cuenta lo que podra costar con la ayuda del seguro (puede ser ms barato con su seguro), pero el sitio web puede darle el precio si no utiliz Tourist information centre manager.  - Puede imprimir el cupn correspondiente y llevarlo con su receta a la farmacia.  - Tambin puede pasar por nuestra oficina durante el horario de atencin regular y Education officer, museum una tarjeta de cupones de GoodRx.  - Si necesita que su receta se enve electrnicamente a una farmacia diferente, informe a nuestra oficina a travs de MyChart de [place] o por telfono llamando al [phone] y presione la opcin 4.

## 2023-11-13 ENCOUNTER — Telehealth: Payer: Self-pay | Admitting: Gastroenterology

## 2023-11-13 ENCOUNTER — Other Ambulatory Visit: Payer: Self-pay

## 2023-11-13 MED ORDER — NA SULFATE-K SULFATE-MG SULF 17.5-3.13-1.6 GM/177ML PO SOLN: 354.0000 mL | Freq: Once | ORAL | 0 refills | Status: AC

## 2023-11-13 NOTE — Telephone Encounter (Signed)
 Called patient back to let him know that I went ahead and [facility] so he could go and get it as soon as today. Patient understood and had no further questions.

## 2023-11-13 NOTE — Telephone Encounter (Signed)
 The patient called regarding his preparation, stating that [facility] at [address] [place], [place] [address] informed him that his prep had not been called in. Follow-up is required to verify the prescription status.

## 2023-11-15 ENCOUNTER — Ambulatory Visit: Admitting: Anesthesiology

## 2023-11-15 ENCOUNTER — Encounter: Payer: Self-pay | Admitting: Gastroenterology

## 2023-11-15 ENCOUNTER — Ambulatory Visit
Admission: RE | Admit: 2023-11-15 | Discharge: 2023-11-15 | Disposition: A | Discharge status: Home / Self Care | Payer: Medicare Other | Attending: Gastroenterology | Admitting: Gastroenterology

## 2023-11-15 ENCOUNTER — Encounter
Admission: RE | Disposition: A | Discharge status: Home / Self Care | Payer: Self-pay | Source: Home / Self Care | Attending: Gastroenterology

## 2023-11-15 DIAGNOSIS — M1990 Unspecified osteoarthritis, unspecified site: Secondary | ICD-10-CM | POA: Diagnosis not present

## 2023-11-15 DIAGNOSIS — Z1211 Encounter for screening for malignant neoplasm of colon: Secondary | ICD-10-CM | POA: Diagnosis not present

## 2023-11-15 DIAGNOSIS — Z860100 Personal history of colon polyps, unspecified: Secondary | ICD-10-CM | POA: Diagnosis not present

## 2023-11-15 DIAGNOSIS — D122 Benign neoplasm of ascending colon: Secondary | ICD-10-CM | POA: Insufficient documentation

## 2023-11-15 DIAGNOSIS — K5730 Diverticulosis of large intestine without perforation or abscess without bleeding: Secondary | ICD-10-CM | POA: Insufficient documentation

## 2023-11-15 DIAGNOSIS — Z87442 Personal history of urinary calculi: Secondary | ICD-10-CM | POA: Insufficient documentation

## 2023-11-15 DIAGNOSIS — Z7982 Long term (current) use of aspirin: Secondary | ICD-10-CM | POA: Insufficient documentation

## 2023-11-15 DIAGNOSIS — Z09 Encounter for follow-up examination after completed treatment for conditions other than malignant neoplasm: Secondary | ICD-10-CM | POA: Insufficient documentation

## 2023-11-15 DIAGNOSIS — Z8546 Personal history of malignant neoplasm of prostate: Secondary | ICD-10-CM | POA: Insufficient documentation

## 2023-11-15 DIAGNOSIS — K635 Polyp of colon: Secondary | ICD-10-CM | POA: Diagnosis not present

## 2023-11-15 HISTORY — PX: COLONOSCOPY WITH PROPOFOL: SHX5780

## 2023-11-15 HISTORY — PX: POLYPECTOMY: SHX149

## 2023-11-15 SURGERY — COLONOSCOPY WITH PROPOFOL
Anesthesia: General

## 2023-11-15 MED ORDER — LIDOCAINE HCL (CARDIAC) PF 100 MG/5ML IV SOSY
PREFILLED_SYRINGE | INTRAVENOUS | Status: DC | PRN
  Administered 2023-11-15: 60 mg via INTRAVENOUS

## 2023-11-15 MED ORDER — SODIUM CHLORIDE 0.9 % IV SOLN: INTRAVENOUS | Status: DC

## 2023-11-15 MED ORDER — DEXMEDETOMIDINE HCL IN NACL 80 MCG/20ML IV SOLN
INTRAVENOUS | Status: DC | PRN
Start: 2023-11-15 — End: 2023-11-15
  Administered 2023-11-15: 20 ug via INTRAVENOUS

## 2023-11-15 MED ORDER — PROPOFOL 500 MG/50ML IV EMUL
INTRAVENOUS | Status: DC | PRN
  Administered 2023-11-15: 75 ug/kg/min via INTRAVENOUS

## 2023-11-15 MED ORDER — PROPOFOL 10 MG/ML IV BOLUS
INTRAVENOUS | Status: DC | PRN
  Administered 2023-11-15 (×2): 50 mg via INTRAVENOUS

## 2023-11-15 NOTE — Op Note (Signed)
 [facility] Gastroenterology Patient Name: Elijah Taylor Procedure Date: 11/15/2023 8:30 AM MRN: [id number] Account #: [id number] Date of Birth: 19-Jan-1951 Admit Type: Outpatient Age: 73 Room: [facility] ENDO ROOM 3 Gender: Male Note Status: Finalized Instrument Name: Colonscope [id number] Procedure:             Colonoscopy Indications:           Surveillance: Incomplete removal of large sessile                         adenoma last colonoscopy (<3 yrs), Last colonoscopy:                         April 2024 Providers:             [name] MD, MD Referring MD:          Dorcas Carrow (Referring MD) Medicines:             Monitored Anesthesia Care Complications:         No immediate complications. Procedure:             Pre-Anesthesia Assessment:                        - Prior to the procedure, a History and Physical was                         performed, and patient medications, allergies and                         sensitivities were reviewed. The patient's tolerance                         of previous anesthesia was reviewed.                        - The risks and benefits of the procedure and the                         sedation options and risks were discussed with the                         patient. All questions were answered and informed                         consent was obtained.                        - ASA Grade Assessment: II - A patient with mild                         systemic disease.                        After obtaining informed consent, the colonoscope was                         passed under direct vision. Throughout the procedure,                         the patient's blood pressure, pulse,  and oxygen                         saturations were monitored continuously. The                         Colonoscope was introduced through the anus and                         advanced to the the cecum, identified by the                         appendiceal  orifice. The colonoscopy was performed                         with ease. The patient tolerated the procedure well.                         The quality of the bowel preparation was good. The                         ileocecal valve, appendiceal orifice, and rectum were                         photographed. Findings:      The perianal and digital rectal examinations were normal.      Multiple medium-mouthed diverticula were found in the left colon.      Two sessile polyps were found in the ascending colon. The polyps were 5       to 8 mm in size. These polyps were removed with a cold snare. Resection       and retrieval were complete.      The exam was otherwise without abnormality on direct and retroflexion       views. Impression:            - Diverticulosis in the left colon.                        - Two 5 to 8 mm polyps in the ascending colon, removed                         with a cold snare. Resected and retrieved.                        - The examination was otherwise normal on direct and                         retroflexion views. Recommendation:        - Discharge patient to home (with escort).                        - Resume previous diet.                        - Continue present medications.                        - Await pathology results.                        -  Repeat colonoscopy in 3 years for surveillance.                        - Flexible sigmoidoscopy in 6-8 months to examine ano                         rectal area Procedure Code(s):     --- Professional ---                        [phone], Colonoscopy, flexible; with removal of                         tumor(s), polyp(s), or other lesion(s) by snare                         technique Diagnosis Code(s):     --- Professional ---                        Z86.010, Personal history of colonic polyps                        K57.30, Diverticulosis of large intestine without                         perforation or abscess without  bleeding                        D12.2, Benign neoplasm of ascending colon CPT copyright 2022 American Medical Association. All rights reserved. The codes documented in this report are preliminary and upon coder review may  be revised to meet current compliance requirements. [name], MD [name] MD, MD 11/15/2023 9:00:09 AM This report has been signed electronically. Number of Addenda: 0 Note Initiated On: 11/15/2023 8:30 AM Scope Withdrawal Time: 0 hours 11 minutes 34 seconds  Total Procedure Duration: 0 hours 13 minutes 59 seconds  Estimated Blood Loss:  Estimated blood loss: none.      [facility]

## 2023-11-15 NOTE — H&P (Signed)
 [name], MD [address], [place], [place], [address] [address], [place], [place], [address] Phone: [phone]  Fax: [phone]  Primary Care Physician:  Dorcas Carrow, DO   Pre-Procedure History & Physical: HPI:  Elijah Taylor is a 73 y.o. male is here for an colonoscopy.   Past Medical History:  Diagnosis Date   Actinic keratosis    Arthritis    History of kidney stones    Insomnia    Prostate cancer (HCC)    Urinary bladder incontinence    Related to Prostectomy   Wears glasses     Past Surgical History:  Procedure Laterality Date    bilateral rotator cuff surgery      ANTERIOR CERVICAL DECOMP/DISCECTOMY FUSION N/A 05/01/2020   Procedure: Cervical Four-Five Anterior Cervical Decompression/Discectomy/Fusion with removal of Cervical Three-Four Hardware;  Surgeon: Tia Alert, MD;  Location: [facility] OR;  Service: Neurosurgery;  Laterality: N/A;   CARPAL TUNNEL RELEASE     left   CARPAL TUNNEL RELEASE Right 12/01/2014   Procedure: RIGHT CARPAL TUNNEL RELEASE;  Surgeon: Cindee Salt, MD;  Location: [facility];  Service: Orthopedics;  Laterality: Right;   CARPAL TUNNEL RELEASE Left 01/11/2022   Procedure: LEFT CARPAL TUNNEL RELEASE;  Surgeon: Betha Loa, MD;  Location: [facility];  Service: Orthopedics;  Laterality: Left;   CATARACT EXTRACTION W/ INTRAOCULAR LENS IMPLANT Right    CATARACT EXTRACTION W/ INTRAOCULAR LENS IMPLANT Right    CERVICAL FUSION  2007   COLONOSCOPY     COLONOSCOPY WITH PROPOFOL N/A 11/07/2017   Procedure: COLONOSCOPY WITH PROPOFOL;  Surgeon: [name], MD;  Location: [facility] ENDOSCOPY;  Service: Gastroenterology;  Laterality: N/A;   COLONOSCOPY WITH PROPOFOL N/A 11/14/2022   Procedure: COLONOSCOPY WITH PROPOFOL;  Surgeon: [name], MD;  Location: [facility] ENDOSCOPY;  Service: Gastroenterology;  Laterality: N/A;   CYSTOSCOPY MACROPLASTIQUE IMPLANT N/A 09/15/2015   Procedure: CYSTOSCOPY  MACROPLASTIQUE IMPLANT;  Surgeon: Orson Ape, MD;  Location: [facility] ORS;  Service: Urology;  Laterality: N/A;   CYSTOSCOPY/URETEROSCOPY/HOLMIUM LASER/STENT PLACEMENT Left 06/29/2018   Procedure: CYSTOSCOPY/URETEROSCOPY/HOLMIUM LASER/STENT PLACEMENT;  Surgeon: Sondra Come, MD;  Location: [facility] ORS;  Service: Urology;  Laterality: Left;   EXTRACORPOREAL SHOCK WAVE LITHOTRIPSY Left 07/12/2018   Procedure: EXTRACORPOREAL SHOCK WAVE LITHOTRIPSY (ESWL);  Surgeon: Sondra Come, MD;  Location: [facility] ORS;  Service: Urology;  Laterality: Left;   EXTRACORPOREAL SHOCK WAVE LITHOTRIPSY Right 10/18/2018   Procedure: EXTRACORPOREAL SHOCK WAVE LITHOTRIPSY (ESWL);  Surgeon: Orson Ape, MD;  Location: [facility] ORS;  Service: Urology;  Laterality: Right;   lower back surgery      x 2   reconstructive thumb surgery      RECTAL EXAM UNDER ANESTHESIA N/A 12/26/2022   Procedure: ANORECTAL EXAM UNDER ANESTHESIA;  Surgeon: Andria Meuse, MD;  Location: [facility] ORS;  Service: General;  Laterality: N/A;   RETROPUBIC PROSTATECTOMY  2010   SHOULDER ARTHROSCOPY W/ ROTATOR CUFF REPAIR     right and left   TRANSANAL EXCISION OF RECTAL MASS N/A 12/26/2022   Procedure: TRANSANAL EXCISION OF DISTAL RECTAL POLYP;  Surgeon: Andria Meuse, MD;  Location: [facility] ORS;  Service: General;  Laterality: N/A;   TRIGGER FINGER RELEASE     x 4   TRIGGER FINGER RELEASE Left 09/02/2014   Procedure: RELEASE A-1 PULLEY LEFT INDEX FINGER;  Surgeon: Cindee Salt, MD;  Location: [facility];  Service: Orthopedics;  Laterality: Left;  ANESTHESIA: IV REGIONAL FAB  TRIGGER FINGER RELEASE Right 05/16/2017   Procedure: RELEASE TRIGGER FINGER/A-1 PULLEY RIGHT MIDDLE FINGER;  Surgeon: Cindee Salt, MD;  Location: [facility];  Service: Orthopedics;  Laterality: Right;   TRIGGER FINGER RELEASE Right 03/28/2019   Procedure: RELEASE TRIGGER FINGER/A-1 PULLEY RIGHT INDEX;  Surgeon: Cindee Salt, MD;  Location: [facility]  ;  Service: Orthopedics;  Laterality: Right;  FAB   TRIGGER FINGER RELEASE Left 04/14/2023   Procedure: RELEASE TRIGGER FINGER/A-1 PULLEY LEFT SMALL FINGER;  Surgeon: Betha Loa, MD;  Location: [facility];  Service: Orthopedics;  Laterality: Left;  30 MIN    Prior to Admission medications   Medication Sig Start Date End Date Taking? Authorizing Provider  acetaminophen-codeine (TYLENOL #3) 300-30 MG tablet Take 1 tablet by mouth every 6 (six) hours as needed for moderate pain. 04/14/23   Betha Loa, MD  aspirin EC 81 MG tablet Take 81 mg by mouth daily with supper.    [provider]  diphenhydramine-acetaminophen (TYLENOL PM) 25-500 MG TABS tablet Take 1 tablet by mouth at bedtime.    [provider]  Multiple Vitamins-Minerals (MULTIVITAMIN WITH MINERALS) tablet Take 1 tablet by mouth daily with supper.    [provider]  triazolam (HALCION) 0.25 MG tablet Take 1 tablet (0.25 mg total) by mouth at bedtime as needed for sleep. 06/23/23   Dorcas Carrow, DO    Allergies as of 03/28/2023   (No Known Allergies)    Family History  Problem Relation Age of Onset   Cancer Mother        Brain Tumor   Kidney failure Father    Prostate cancer Father    Heart disease Brother    Heart attack Maternal Grandfather    Prostate cancer Maternal Uncle    Prostate cancer Maternal Uncle     Social History   Socioeconomic History   Marital status: Married    Spouse name: Not on file   Number of children: 2   Years of education: Not on file   Highest education level: Not on file  Occupational History   Not on file  Tobacco Use   Smoking status: Never   Smokeless tobacco: Never  Vaping Use   Vaping status: Never Used  Substance and Sexual Activity   Alcohol use: Yes    Alcohol/week: 1.0 standard drink of alcohol    Types: 1 Glasses of wine per week    Comment: occasionally   Drug use: No   Sexual activity: Not Currently   Other Topics Concern   Not on file  Social History Narrative   Lives with wife; in [place]; retd. mechanic maintenance; no smoking; on alcohol    Social Drivers of Corporate investment banker Strain: Not on file  Food Insecurity: Not on file  Transportation Needs: Not on file  Physical Activity: Not on file  Stress: Not on file  Social Connections: Not on file  Intimate Partner Violence: Not on file    Review of Systems: See HPI, otherwise negative ROS  Physical Exam: BP (!) 142/89   Pulse (!) 118   Temp 97.9 F (36.6 C) (Temporal)   Resp 18   Ht 5\' 9"  (1.753 m)   Wt 81.6 kg   SpO2 96%   BMI 26.58 kg/m  General:   Alert,  pleasant and cooperative in NAD Head:  Normocephalic and atraumatic. Neck:  Supple; no masses or thyromegaly. Lungs:  Clear throughout to auscultation, normal respiratory effort.  Heart:  +S1, +S2, Regular rate and rhythm, No edema. Abdomen:  Soft, nontender and nondistended. Normal bowel sounds, without guarding, and without rebound.   Neurologic:  Alert and  oriented x4;  grossly normal neurologically.  Impression/Plan: Elijah Taylor is here for an colonoscopy to be performed for surveillance due to prior history of colon polyps   Risks, benefits, limitations, and alternatives regarding  colonoscopy have been reviewed with the patient.  Questions have been answered.  All parties agreeable.   [name], MD  11/15/2023, 7:54 AM

## 2023-11-15 NOTE — Transfer of Care (Signed)
 Immediate Anesthesia Transfer of Care Note  Patient: Elijah Taylor  Procedure(s) Performed: COLONOSCOPY WITH PROPOFOL POLYPECTOMY, INTESTINE  Patient Location: PACU  Anesthesia Type:General  Level of Consciousness: sedated  Airway & Oxygen Therapy: Patient Spontanous Breathing  Post-op Assessment: Report given to RN  Post vital signs: Reviewed and stable  Last Vitals:  Vitals Value Taken Time  BP 103/72 11/15/23 0859  Temp 36.4 C 11/15/23 0858  Pulse 92 11/15/23 0859  Resp 20 11/15/23 0900  SpO2 98 % 11/15/23 0859  Vitals shown include unfiled device data.  Last Pain:  Vitals:   11/15/23 0858  TempSrc: Temporal  PainSc: 0-No pain         Complications: No notable events documented.

## 2023-11-15 NOTE — Anesthesia Preprocedure Evaluation (Signed)
 Anesthesia Evaluation  Patient identified by MRN, date of birth, ID band Patient awake    Reviewed: Allergy & Precautions, NPO status , Patient's Chart, lab work & pertinent test results  History of Anesthesia Complications Negative for: history of anesthetic complications  Airway Mallampati: II  TM Distance: >3 FB Neck ROM: full    Dental  (+) Dental Advidsory Given, Caps, Teeth Intact   Pulmonary neg pulmonary ROS   Pulmonary exam normal        Cardiovascular negative cardio ROS Normal cardiovascular exam     Neuro/Psych negative neurological ROS  negative psych ROS   GI/Hepatic negative GI ROS, Neg liver ROS,,,  Endo/Other  negative endocrine ROS    Renal/GU negative Renal ROS Bladder dysfunction      Musculoskeletal  (+) Arthritis ,    Abdominal   Peds  Hematology negative hematology ROS (+)   Anesthesia Other Findings Past Medical History: No date: Arthritis No date: History of kidney stones No date: Insomnia No date: Pneumonia No date: Prostate cancer (HCC) No date: Urinary bladder incontinence     Comment:  Related to Prostectomy No date: Wears glasses  Past Surgical History: 05/01/2020: ANTERIOR CERVICAL DECOMP/DISCECTOMY FUSION; N/A     Comment:  Procedure: Cervical Four-Five Anterior Cervical               Decompression/Discectomy/Fusion with removal of Cervical               Three-Four Hardware;  Surgeon: Tia Alert, MD;                Location: [facility] OR;  Service: Neurosurgery;  Laterality:               N/A; No date: CARPAL TUNNEL RELEASE     Comment:  left 12/01/2014: CARPAL TUNNEL RELEASE; Right     Comment:  Procedure: RIGHT CARPAL TUNNEL RELEASE;  Surgeon: Cindee Salt, MD;  Location: [facility];                Service: Orthopedics;  Laterality: Right; 01/11/2022: CARPAL TUNNEL RELEASE; Left     Comment:  Procedure: LEFT CARPAL TUNNEL RELEASE;  Surgeon:  Betha Loa, MD;  Location:  SURGERY CENTER;                Service: Orthopedics;  Laterality: Left; No date: CATARACT EXTRACTION W/ INTRAOCULAR LENS IMPLANT; Right No date: CATARACT EXTRACTION W/ INTRAOCULAR LENS IMPLANT; Right 2007: CERVICAL FUSION No date: COLONOSCOPY 11/07/2017: COLONOSCOPY WITH PROPOFOL; N/A     Comment:  Procedure: COLONOSCOPY WITH PROPOFOL;  Surgeon: [name], MD;  Location: [facility] ENDOSCOPY;  Service:               Gastroenterology;  Laterality: N/A; 09/15/2015: CYSTOSCOPY MACROPLASTIQUE IMPLANT; N/A     Comment:  Procedure: CYSTOSCOPY MACROPLASTIQUE IMPLANT;  Surgeon:               Orson Ape, MD;  Location: [facility] ORS;  Service:               Urology;  Laterality: N/A; 06/29/2018: CYSTOSCOPY/URETEROSCOPY/HOLMIUM LASER/STENT PLACEMENT;  Left     Comment:  Procedure: CYSTOSCOPY/URETEROSCOPY/HOLMIUM LASER/STENT  PLACEMENT;  Surgeon: Sondra Come, MD;  Location:               [facility] ORS;  Service: Urology;  Laterality: Left; 07/12/2018: EXTRACORPOREAL SHOCK WAVE LITHOTRIPSY; Left     Comment:  Procedure: EXTRACORPOREAL SHOCK WAVE LITHOTRIPSY (ESWL);              Surgeon: Sondra Come, MD;  Location: [facility] ORS;                Service: Urology;  Laterality: Left; 10/18/2018: EXTRACORPOREAL SHOCK WAVE LITHOTRIPSY; Right     Comment:  Procedure: EXTRACORPOREAL SHOCK WAVE LITHOTRIPSY (ESWL);              Surgeon: Orson Ape, MD;  Location: [facility] ORS;                Service: Urology;  Laterality: Right; 2010: RETROPUBIC PROSTATECTOMY No date: SHOULDER ARTHROSCOPY W/ ROTATOR CUFF REPAIR     Comment:  right and left No date: TRIGGER FINGER RELEASE     Comment:  x 4 09/02/2014: TRIGGER FINGER RELEASE; Left     Comment:  Procedure: RELEASE A-1 PULLEY LEFT INDEX FINGER;                Surgeon: Cindee Salt, MD;  Location: [facility];  Service: Orthopedics;  Laterality: Left;                 ANESTHESIA: IV REGIONAL FAB 05/16/2017: TRIGGER FINGER RELEASE; Right     Comment:  Procedure: RELEASE TRIGGER FINGER/A-1 PULLEY RIGHT               MIDDLE FINGER;  Surgeon: Cindee Salt, MD;  Location:               [facility];  Service: Orthopedics;                Laterality: Right; 03/28/2019: TRIGGER FINGER RELEASE; Right     Comment:  Procedure: RELEASE TRIGGER FINGER/A-1 PULLEY RIGHT               INDEX;  Surgeon: Cindee Salt, MD;  Location: [facility];  Service: Orthopedics;  Laterality:               Right;  FAB     Reproductive/Obstetrics negative OB ROS                             Anesthesia Physical Anesthesia Plan  ASA: 2  Anesthesia Plan: General   Post-op Pain Management: Minimal or no pain anticipated   Induction: Intravenous  PONV Risk Score and Plan: 1 and Propofol infusion and TIVA  Airway Management Planned: Natural Airway and Nasal Cannula  Additional Equipment:   Intra-op Plan:   Post-operative Plan:   Informed Consent: I have reviewed the patients History and Physical, chart, labs and discussed the procedure including the risks, benefits and alternatives for the proposed anesthesia with the patient or authorized representative who has indicated his/her understanding and acceptance.     Dental Advisory Given  Plan Discussed with: Anesthesiologist, CRNA and Surgeon  Anesthesia Plan Comments: (Patient consented for risks of anesthesia including but not limited to:  - adverse reactions to medications - risk of airway placement if  required - damage to eyes, teeth, lips or other oral mucosa - nerve damage due to positioning  - sore throat or hoarseness - Damage to heart, brain, nerves, lungs, other parts of body or loss of life  Patient voiced understanding.)       Anesthesia Quick Evaluation

## 2023-11-16 ENCOUNTER — Encounter: Payer: Self-pay | Admitting: Gastroenterology

## 2023-11-16 LAB — SURGICAL PATHOLOGY

## 2023-11-17 NOTE — Anesthesia Postprocedure Evaluation (Signed)
 Anesthesia Post Note  Patient: Elijah Taylor  Procedure(s) Performed: COLONOSCOPY WITH PROPOFOL POLYPECTOMY, INTESTINE  Patient location during evaluation: Endoscopy Anesthesia Type: General Level of consciousness: awake and alert Pain management: pain level controlled Vital Signs Assessment: post-procedure vital signs reviewed and stable Respiratory status: spontaneous breathing, nonlabored ventilation, respiratory function stable and patient connected to nasal cannula oxygen Cardiovascular status: blood pressure returned to baseline and stable Postop Assessment: no apparent nausea or vomiting Anesthetic complications: no   No notable events documented.   Last Vitals:  Vitals:   11/15/23 0725 11/15/23 0858  BP: (!) 142/89 103/72  Pulse: (!) 118 92  Resp: 18 17  Temp: 36.6 C (!) 36.4 C  SpO2: 96% 98%    Last Pain:  Vitals:   11/16/23 0744  TempSrc:   PainSc: 0-No pain                 Lenard Simmer

## 2023-11-21 ENCOUNTER — Encounter: Payer: Self-pay | Admitting: Gastroenterology

## 2023-12-04 ENCOUNTER — Encounter: Payer: Self-pay | Admitting: Family Medicine

## 2023-12-04 ENCOUNTER — Ambulatory Visit (INDEPENDENT_AMBULATORY_CARE_PROVIDER_SITE_OTHER): Admitting: Family Medicine

## 2023-12-04 VITALS — BP 125/84 | HR 102 | Temp 99.3°F | Wt 185.6 lb

## 2023-12-04 DIAGNOSIS — R21 Rash and other nonspecific skin eruption: Secondary | ICD-10-CM | POA: Diagnosis not present

## 2023-12-04 MED ORDER — TRIAMCINOLONE ACETONIDE 40 MG/ML IJ SUSP
40.0000 mg | Freq: Once | INTRAMUSCULAR | Status: AC
  Administered 2023-12-04: 40 mg via INTRAMUSCULAR

## 2023-12-04 MED ORDER — PREDNISONE 10 MG PO TABS: ORAL_TABLET | ORAL | 0 refills | Status: DC

## 2023-12-04 NOTE — Progress Notes (Signed)
 BP 125/84 (BP Location: Left Arm, Patient Position: Sitting, Cuff Size: Normal)   Pulse (!) 102   Temp 99.3 F (37.4 C) (Oral)   Wt 185 lb 9.6 oz (84.2 kg)   SpO2 96%   BMI 27.41 kg/m    Subjective:    Patient ID: Elijah Taylor, male    DOB: [birth year], 73 y.o.   MRN: [id number]  HPI: Elijah Taylor is a 73 y.o. male  Chief Complaint  Patient presents with   Rash    Onset about 3 weeks ago. Red spots all over the body. Very itchy. Denies pain.    RASH- has had bed bugs at home Duration:  2-3 weeks  Location: generalized  Itching: yes Burning: yes Redness: yes Oozing: no Scaling: no Blisters: no Painful: no Fevers: no Change in detergents/soaps/personal care products: no Recent illness: no Recent travel:no History of same: no Context: worse Alleviating factors: nothing Treatments attempted:nothing Shortness of breath: no  Throat/tongue swelling: no Myalgias/arthralgias: no  Relevant past medical, surgical, family and social history reviewed and updated as indicated. Interim medical history since our last visit reviewed. Allergies and medications reviewed and updated.  Review of Systems  Constitutional:  Positive for diaphoresis and fatigue. Negative for activity change, appetite change, chills, fever and unexpected weight change.  Respiratory: Negative.    Cardiovascular: Negative.   Skin:  Positive for rash. Negative for color change, pallor and wound.  Neurological:  Positive for headaches. Negative for dizziness, tremors, seizures, syncope, facial asymmetry, speech difficulty, weakness, light-headedness and numbness.  Psychiatric/Behavioral: Negative.      Per HPI unless specifically indicated above     Objective:    BP 125/84 (BP Location: Left Arm, Patient Position: Sitting, Cuff Size: Normal)   Pulse (!) 102   Temp 99.3 F (37.4 C) (Oral)   Wt 185 lb 9.6 oz (84.2 kg)   SpO2 96%   BMI 27.41 kg/m   Wt Readings from Last 3 Encounters:   12/04/23 185 lb 9.6 oz (84.2 kg)  11/15/23 180 lb (81.6 kg)  06/23/23 187 lb 6.4 oz (85 kg)    Physical Exam Vitals and nursing note reviewed.  Constitutional:      General: He is not in acute distress.    Appearance: Normal appearance. He is not ill-appearing, toxic-appearing or diaphoretic.  HENT:     Head: Normocephalic and atraumatic.     Right Ear: External ear normal.     Left Ear: External ear normal.     Nose: Nose normal.     Mouth/Throat:     Mouth: Mucous membranes are moist.     Pharynx: Oropharynx is clear.  Eyes:     General: No scleral icterus.       Right eye: No discharge.        Left eye: No discharge.     Extraocular Movements: Extraocular movements intact.     Conjunctiva/sclera: Conjunctivae normal.     Pupils: Pupils are equal, round, and reactive to light.  Cardiovascular:     Rate and Rhythm: Normal rate and regular rhythm.     Pulses: Normal pulses.     Heart sounds: Normal heart sounds. No murmur heard.    No friction rub. No gallop.  Pulmonary:     Effort: Pulmonary effort is normal. No respiratory distress.     Breath sounds: Normal breath sounds. No stridor. No wheezing, rhonchi or rales.  Chest:     Chest wall: No tenderness.  Musculoskeletal:  General: Normal range of motion.     Cervical back: Normal range of motion and neck supple.  Skin:    General: Skin is warm and dry.     Capillary Refill: Capillary refill takes less than 2 seconds.     Coloration: Skin is not jaundiced or pale.     Findings: Rash present. No bruising, erythema or lesion.  Neurological:     General: No focal deficit present.     Mental Status: He is alert and oriented to person, place, and time. Mental status is at baseline.  Psychiatric:        Mood and Affect: Mood normal.        Behavior: Behavior normal.        Thought Content: Thought content normal.        Judgment: Judgment normal.     Results for orders placed or performed during the hospital  encounter of 11/15/23  Surgical pathology   Collection Time: 11/15/23 12:00 AM  Result Value Ref Range   SURGICAL PATHOLOGY      SURGICAL PATHOLOGY [facility] [address], [address] [place], [place] [address] Telephone [phone] or [phone] Fax [phone]  REPORT OF SURGICAL PATHOLOGY   Accession #: [id number] Patient Name: LERONE, CHAVOYA Visit # : [id number]  MRN: [id number] Physician: Luke Salaam DOB/Age 09/30/1950 (Age: 59) Gender: M Collected Date: 11/15/2023 Received Date: 11/15/2023  FINAL DIAGNOSIS       1. Ascending  Colon Polyp, cold snare x2 :       - TUBULAR ADENOMA(S)      - NEGATIVE FOR HIGH-GRADE DYSPLASIA OR MALIGNANCY       DATE SIGNED OUT: 11/16/2023 ELECTRONIC SIGNATURE : Bernetta Brilliant Md, Nilesh, Pathologist, Electronic Signature  MICROSCOPIC DESCRIPTION  CASE COMMENTS STAINS USED IN DIAGNOSIS: H&E    CLINICAL HISTORY  SPECIMEN(S) OBTAINED 1. Ascending  Colon Polyp, Cold Snare X2  SPECIMEN COMMENTS: SPECIMEN CLINICAL INFORMATION: 1. Colon polyps    Gross Description 1. "Cold snare ascending colon polyp x2", receive d in formalin is a 1.3 x 0.5 x 0.1 cm aggregate of multiple tan-gray tissue fragments. The specimen is submitted in toto in 1 block (1A).      AMG 11/15/2023        Report signed out from the following location(s) [place] [address], [place] [address] CLIA #: [id number]  [facility] [address] [place], [place] [address] CLIA #: [id number]       Assessment & Plan:   Problem List Items Addressed This Visit   None Visit Diagnoses       Rash    -  Primary   Will check labs and treat with steroids. Recheck in 2-3 weeks. Call with any concerns.   Relevant Medications   triamcinolone  acetonide (KENALOG -40) injection 40 mg   Other Relevant Orders   Spotted Fever Group Antibodies   Lyme Disease Serology w/Reflex   RPR  w/reflex to TrepSure   CBC with Differential/Platelet   Comp Met (CMET)        Follow up plan: Return in about 3 weeks (around 12/25/2023).

## 2023-12-06 LAB — CBC WITH DIFFERENTIAL/PLATELET
Basophils Absolute: 0.1 10*3/uL (ref 0.0–0.2)
Basos: 1 %
EOS (ABSOLUTE): 0.2 10*3/uL (ref 0.0–0.4)
Eos: 2 %
Hematocrit: 45.2 % (ref 37.5–51.0)
Hemoglobin: 15.1 g/dL (ref 13.0–17.7)
Immature Grans (Abs): 0 10*3/uL (ref 0.0–0.1)
Immature Granulocytes: 1 %
Lymphocytes Absolute: 2.1 10*3/uL (ref 0.7–3.1)
Lymphs: 24 %
MCH: 31.4 pg (ref 26.6–33.0)
MCHC: 33.4 g/dL (ref 31.5–35.7)
MCV: 94 fL (ref 79–97)
Monocytes Absolute: 1 10*3/uL — ABNORMAL HIGH (ref 0.1–0.9)
Monocytes: 12 %
Neutrophils Absolute: 5.3 10*3/uL (ref 1.4–7.0)
Neutrophils: 60 %
Platelets: 367 10*3/uL (ref 150–450)
RBC: 4.81 x10E6/uL (ref 4.14–5.80)
RDW: 11.9 % (ref 11.6–15.4)
WBC: 8.7 10*3/uL (ref 3.4–10.8)

## 2023-12-06 LAB — COMPREHENSIVE METABOLIC PANEL WITH GFR
ALT: 44 IU/L (ref 0–44)
AST: 40 IU/L (ref 0–40)
Albumin: 3.9 g/dL (ref 3.8–4.8)
Alkaline Phosphatase: 89 IU/L (ref 44–121)
BUN/Creatinine Ratio: 15 (ref 10–24)
BUN: 15 mg/dL (ref 8–27)
Bilirubin Total: 0.5 mg/dL (ref 0.0–1.2)
CO2: 19 mmol/L — ABNORMAL LOW (ref 20–29)
Calcium: 9.2 mg/dL (ref 8.6–10.2)
Chloride: 106 mmol/L (ref 96–106)
Creatinine, Ser: 0.97 mg/dL (ref 0.76–1.27)
Globulin, Total: 3.4 g/dL (ref 1.5–4.5)
Glucose: 88 mg/dL (ref 70–99)
Potassium: 4.6 mmol/L (ref 3.5–5.2)
Sodium: 140 mmol/L (ref 134–144)
Total Protein: 7.3 g/dL (ref 6.0–8.5)
eGFR: 82 mL/min/{1.73_m2} (ref >59)

## 2023-12-06 LAB — SPOTTED FEVER GROUP ANTIBODIES

## 2023-12-06 LAB — RPR W/REFLEX TO TREPSURE

## 2023-12-06 LAB — LYME DISEASE SEROLOGY W/REFLEX: Lyme Total Antibody EIA: NEGATIVE

## 2023-12-06 LAB — RPR, QUANT: RPR, Quant: 1:16 {titer} — ABNORMAL HIGH

## 2023-12-07 ENCOUNTER — Telehealth: Payer: Self-pay

## 2023-12-07 NOTE — Telephone Encounter (Signed)
 Ok for E2C2 to review.  Left return message. We do not yet have all test results back that are needed. We do ask that if they would like to go ahead and start treatment to go ahead and do so as he does have a full body rash. We have not notified patient of anything at this time.

## 2023-12-07 NOTE — Telephone Encounter (Signed)
 Copied from CRM [id number]. 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 Please cb

## 2023-12-11 ENCOUNTER — Encounter: Payer: Self-pay | Admitting: Family Medicine

## 2023-12-11 ENCOUNTER — Ambulatory Visit: Payer: Self-pay | Admitting: Family Medicine

## 2023-12-11 DIAGNOSIS — Z113 Encounter for screening for infections with a predominantly sexual mode of transmission: Secondary | ICD-10-CM

## 2023-12-11 DIAGNOSIS — A530 Latent syphilis, unspecified as early or late: Secondary | ICD-10-CM

## 2023-12-11 LAB — HM HIV SCREENING LAB: HM HIV Screening: NEGATIVE

## 2023-12-11 MED ORDER — PENICILLIN G BENZATHINE 1200000 UNIT/2ML IM SUSY
2.4000 10*6.[IU] | PREFILLED_SYRINGE | Freq: Once | INTRAMUSCULAR | Status: AC
  Administered 2023-12-11: 2.4 10*6.[IU] via INTRAMUSCULAR

## 2023-12-11 MED ORDER — PENICILLIN G BENZATHINE 1200000 UNIT/2ML IM SUSY
2.4000 10*6.[IU] | PREFILLED_SYRINGE | INTRAMUSCULAR | Status: AC
  Administered 2023-12-18 – 2023-12-25 (×2): 2.4 10*6.[IU] via INTRAMUSCULAR

## 2023-12-11 NOTE — Progress Notes (Signed)
 [facility] Department STI clinic [address], [address] [place] [address] Main phone: [phone]  STI screening visit  Subjective:  Elijah Taylor is a 73 y.o. male being seen today for an STI screening visit. The patient reports they do have symptoms.    Patient has the following medical conditions:  Patient Active Problem List   Diagnosis Date Noted   Insomnia 06/23/2023   Controlled substance agreement signed 06/23/2023   History of colonic polyps 11/14/2022   Adenomatous polyp of colon 11/14/2022   S/P lumbar fusion 11/30/2020   S/P cervical spinal fusion 05/01/2020   Advanced care planning/counseling discussion 10/30/2017   Trigger middle finger of right hand 05/10/2017   Actinic keratosis 03/01/2017   Solar dermatitis 03/01/2017   Prostate cancer (HCC) 10/06/2016   Hypercholesteremia 10/06/2016    Chief Complaint  Patient presents with   SEXUALLY TRANSMITTED DISEASE    HPI HPI Patient reports to clinic as a referral from [facility]. States that he has a rash on his body that is itchy.   STI screening history: Last HIV test per patient/review of record was No results found for: "HMHIVSCREEN" No results found for: "HIV"  Last HEPC test per patient/review of record was No results found for: "HMHEPCSCREEN" No components found for: "HEPC"   Last HEPB test per patient/review of record was No components found for: "HMHEPBSCREEN"   Fertility: Does the patient or their partner desires a pregnancy in the next year? No  Screening for MPX risk: Does the patient have an unexplained rash? Yes Is the patient MSM? Yes Does the patient endorse multiple sex partners or anonymous sex partners? Yes Did the patient have close or sexual contact with a person diagnosed with MPX? No Has the patient traveled outside the [place]  where MPX is endemic? No Is there a high clinical suspicion for MPX-- evidenced by one of the following No  -Unlikely to be  chickenpox  -Lymphadenopathy  -Rash that present in same phase of evolution on any given body part   See flowsheet for further details and programmatic requirements.   Immunization History  Administered Date(s) Administered   PNEUMOCOCCAL CONJUGATE-20 06/23/2022   Td 06/22/2022   Tdap 08/08/2001   Zoster, Live 07/22/2013     The following portions of the patient's history were reviewed and updated as appropriate: allergies, current medications, past medical history, past social history, past surgical history and problem list.  Objective:  There were no vitals filed for this visit.  Physical Exam Vitals and nursing note reviewed.  Constitutional:      Appearance: Normal appearance.  HENT:     Head: Normocephalic and atraumatic.     Mouth/Throat:     Mouth: Mucous membranes are moist.     Pharynx: No oropharyngeal exudate or posterior oropharyngeal erythema.  Eyes:     General:        Right eye: No discharge.        Left eye: No discharge.     Conjunctiva/sclera:     Right eye: Right conjunctiva is not injected. No exudate.    Left eye: Left conjunctiva is not injected. No exudate. Pulmonary:     Effort: Pulmonary effort is normal.  Abdominal:     General: Abdomen is flat.     Palpations: Abdomen is soft. There is no hepatomegaly or mass.     Tenderness: There is no abdominal tenderness. There is no rebound.  Genitourinary:    Comments: Declined genital exam- asymptomatic Lymphadenopathy:  Cervical: No cervical adenopathy.     Upper Body:     Right upper body: No supraclavicular or axillary adenopathy.     Left upper body: No supraclavicular or axillary adenopathy.  Skin:    General: Skin is warm and dry.     Findings: Rash present. Rash is papular and urticarial.          Comments: Generalized erythematous raised rash  Neurological:     Mental Status: He is alert and oriented to person, place, and time.     Assessment and Plan:  Elijah Taylor is a  73 y.o. male presenting to the [facility] Department for STI screening  1. 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 Screening for venereal disease  - Syphilis Serology, [facility] - HIV  LAB - penicillin g benzathine (BICILLIN LA) 1200000 UNIT/2ML injection 2.4 Million Units   Patient does have STI symptoms Patient accepted all screenings including  urine GC/Chlamydia, and blood work for HIV/Syphilis. Patient meets criteria for HepB screening? Yes. Ordered? No- practitioner oversight- forgot to order Patient meets criteria for HepC screening? No. Ordered? not applicable Recommended condom use with all sex Discussed importance of condom use for STI prevention  Treat positive test results per standing order. Discussed time line for State Lab results and that patient will be called with positive results and encouraged patient to call if he had not heard in 2 weeks Recommended repeat testing in 3 months with positive results. Recommended returning for continued or worsening symptoms.   Return in about 1 week (around 12/18/2023) for bicillin .  Future Appointments  Date Time Provider Department Center  12/18/2023  9:05 AM [facility] NURSE [facility] None  12/25/2023  9:00 AM [facility] NURSE [facility] None  12/26/2023 11:00 AM Solomon Dupre, DO [facility]  06/25/2024  8:00 AM Solomon Dupre, DO [facility]    Elijah Taylor, [place]

## 2023-12-11 NOTE — Progress Notes (Signed)
 Pt here for STI screening and treatment for syphilis.  Bicillin 1.2 mil x 2 given bilaterally in right and left upper outer quadrants without difficulty.  Appointments for additional treatment to be scheduled for 5-12 and 12-25-23 in nurse Lorri Rota, RN

## 2023-12-13 LAB — SYPHILIS SEROLOGY, ~~LOC~~ LAB
RPR, Quant: 1:64 {titer}
RPR: REACTIVE
Syphilis Treponemal Ab: REACTIVE

## 2023-12-18 ENCOUNTER — Ambulatory Visit: Payer: Self-pay

## 2023-12-18 DIAGNOSIS — A539 Syphilis, unspecified: Secondary | ICD-10-CM

## 2023-12-18 DIAGNOSIS — A530 Latent syphilis, unspecified as early or late: Secondary | ICD-10-CM

## 2023-12-18 NOTE — Progress Notes (Signed)
 In nurse clinic for Bicillin  #2 / syphilis tx. Reports rash pelvic area, low back, stomach that started morning after last bicillin  shots. Lasted one day and resolved. Mild itching and used "itch cream" for relief. States no problems today.   Consult Fain Home, FNP who gives ok for Bicillin  today States mild reaction. Advises patient to take Benadryl  after Bicillin  injections today.  RN discussed with patient and he is in agreement with Benadryl .   Bicillin  2.16mu/4ml given today per order by  Fain Home, FNP dated 12/18/2023. Tolerated injections well. Walked in hall for approx 15 to 20 min without problems. Advised to seek immediate medical attn/call 911 if adverse reaction. Verbalizes understanding. Has appt on 12/25/2023 for Bicillin  #3 Derinda Fleischer, RN

## 2023-12-21 ENCOUNTER — Telehealth: Payer: Self-pay

## 2023-12-21 NOTE — Telephone Encounter (Signed)
 Call pt re syphilis results from 12/11/23 specimen: RPR= Reactive Titer= 1:64 TP= Reactive  [facility] provider to review lab.

## 2023-12-22 ENCOUNTER — Ambulatory Visit: Payer: Self-pay | Admitting: Family Medicine

## 2023-12-22 NOTE — Telephone Encounter (Signed)
 Phone call to pt at [phone]. Pt answered phone and confirmed password. Discussed 12/11/23 syphilis results. Last bicillin  tx scheduled for 12/25/23. RTC for f/u in 3 months.  ------------------------------------------------  See Dr. Gayle Kava 12/22/23 Results f/u document. Bicillin  x 3.  Final #3 tx scheduled for 12/25/23.

## 2023-12-22 NOTE — Progress Notes (Signed)
 Reviewed. No other RPR results within past year. 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 Treatments 5/05, 5/12, and 5/19.  Tempie Fee, MD 12/22/23  10:24 AM

## 2023-12-25 ENCOUNTER — Ambulatory Visit: Payer: Self-pay

## 2023-12-25 DIAGNOSIS — A539 Syphilis, unspecified: Secondary | ICD-10-CM

## 2023-12-25 DIAGNOSIS — A530 Latent syphilis, unspecified as early or late: Secondary | ICD-10-CM

## 2023-12-25 NOTE — Progress Notes (Signed)
 In nurse clinic for Bicillin  #3/ syphilis tx.  States experienced only soreness from injections last week. No other side effects/rxn. States he took benadryl  last week after injections without problem. States no sex since tx started and advised no sex for 14 days after tx.   Bicillin  2.46mu/4ml given IM today without problem. Walked in hall for 15 min after injections without issue. Advised to seek immediate medical attn/call 911 if serious allergic reaction.  Return for RPR in 6 mo, reminder given. Marquail Bradwell, RN

## 2023-12-25 NOTE — Telephone Encounter (Signed)
 Pt kept 12/25/23 tx appt.  Bicillin  #3 administered.  RTC in 6 mths for follow-up.

## 2023-12-26 ENCOUNTER — Ambulatory Visit: Admitting: Family Medicine

## 2024-02-23 ENCOUNTER — Encounter: Payer: Self-pay | Admitting: Advanced Practice Midwife

## 2024-03-01 ENCOUNTER — Encounter: Payer: Self-pay | Admitting: Family Medicine

## 2024-03-01 ENCOUNTER — Ambulatory Visit (INDEPENDENT_AMBULATORY_CARE_PROVIDER_SITE_OTHER): Admitting: Family Medicine

## 2024-03-01 VITALS — BP 142/90 | HR 103 | Temp 97.8°F | Ht 69.0 in | Wt 185.8 lb

## 2024-03-01 DIAGNOSIS — R5383 Other fatigue: Secondary | ICD-10-CM | POA: Diagnosis not present

## 2024-03-01 DIAGNOSIS — H109 Unspecified conjunctivitis: Secondary | ICD-10-CM | POA: Diagnosis not present

## 2024-03-01 DIAGNOSIS — B9689 Other specified bacterial agents as the cause of diseases classified elsewhere: Secondary | ICD-10-CM

## 2024-03-01 MED ORDER — ERYTHROMYCIN 5 MG/GM OP OINT: 1.0000 | TOPICAL_OINTMENT | Freq: Three times a day (TID) | OPHTHALMIC | 0 refills | Status: DC

## 2024-03-01 NOTE — Progress Notes (Signed)
 BP (!) 142/90   Pulse (!) 103   Temp 97.8 F (36.6 C) (Oral)   Ht 5' 9 (1.753 m)   Wt 185 lb 12.8 oz (84.3 kg)   SpO2 97%   BMI 27.44 kg/m    Subjective:    Patient ID: Elijah Taylor, male    DOB: [birth year], 73 y.o.   MRN: [id number]  HPI: Elijah Taylor is a 73 y.o. male  Chief Complaint  Patient presents with   Eye Problem    Left eye. Onset several days ago. Pain with blinking. Redness. Irritation. OTC eye drops no relief.   Eye Drainage   EYE IRRITATION Duration:  couple of days Involved eye:  bilateral Onset: sudden Severity: moderate  Quality: itchy and red Foreign body sensation:no Visual impairment: no Eye redness: yes Discharge: yes Crusting or matting of eyelids: yes Swelling: yes Photophobia: no Itching: yes Tearing: yes Headache: no Floaters: no URI symptoms: no Contact lens use: no Close contacts with similar problems: no Eye trauma: no Status: stable Treatments attempted: compresses  Relevant past medical, surgical, family and social history reviewed and updated as indicated. Interim medical history since our last visit reviewed. Allergies and medications reviewed and updated.  Review of Systems  Constitutional:  Positive for fatigue. Negative for activity change, appetite change, chills, diaphoresis, fever and unexpected weight change.  HENT: Negative.    Eyes:  Positive for discharge, redness and itching. Negative for photophobia, pain and visual disturbance.  Respiratory: Negative.    Cardiovascular: Negative.   Musculoskeletal: Negative.   Psychiatric/Behavioral: Negative.      Per HPI unless specifically indicated above     Objective:    BP (!) 142/90   Pulse (!) 103   Temp 97.8 F (36.6 C) (Oral)   Ht 5' 9 (1.753 m)   Wt 185 lb 12.8 oz (84.3 kg)   SpO2 97%   BMI 27.44 kg/m   Wt Readings from Last 3 Encounters:  03/01/24 185 lb 12.8 oz (84.3 kg)  12/04/23 185 lb 9.6 oz (84.2 kg)  11/15/23 180 lb (81.6 kg)     Physical Exam Vitals and nursing note reviewed.  Constitutional:      General: He is not in acute distress.    Appearance: Normal appearance. He is not ill-appearing, toxic-appearing or diaphoretic.  HENT:     Head: Normocephalic and atraumatic.     Right Ear: External ear normal.     Left Ear: External ear normal.     Nose: Nose normal.     Mouth/Throat:     Mouth: Mucous membranes are moist.     Pharynx: Oropharynx is clear.  Eyes:     General: No scleral icterus.       Right eye: Discharge present.        Left eye: Discharge present.    Extraocular Movements: Extraocular movements intact.     Conjunctiva/sclera:     Right eye: Exudate present.     Left eye: Exudate present.     Pupils: Pupils are equal, round, and reactive to light.  Cardiovascular:     Rate and Rhythm: Normal rate and regular rhythm.     Pulses: Normal pulses.     Heart sounds: Normal heart sounds. No murmur heard.    No friction rub. No gallop.  Pulmonary:     Effort: Pulmonary effort is normal. No respiratory distress.     Breath sounds: Normal breath sounds. No stridor. No wheezing, rhonchi or rales.  Chest:     Chest wall: No tenderness.  Musculoskeletal:        General: Normal range of motion.     Cervical back: Normal range of motion and neck supple.  Skin:    General: Skin is warm and dry.     Capillary Refill: Capillary refill takes less than 2 seconds.     Coloration: Skin is not jaundiced or pale.     Findings: No bruising, erythema, lesion or rash.  Neurological:     General: No focal deficit present.     Mental Status: He is alert and oriented to person, place, and time. Mental status is at baseline.  Psychiatric:        Mood and Affect: Mood normal.        Behavior: Behavior normal.        Thought Content: Thought content normal.        Judgment: Judgment normal.     Results for orders placed or performed in visit on 12/20/23  [facility] SCREENING LAB   Collection Time: 12/11/23  12:00 AM  Result Value Ref Range   HM HIV Screening Negative - Validated       Assessment & Plan:   Problem List Items Addressed This Visit   None Visit Diagnoses       Bacterial conjunctivitis of both eyes    -  Primary   Will treat with erythromycin . Call if not getting better or getting worse.     Other fatigue       Refuses work up today. Call if getting worse and we'll get him into cardiology.        Follow up plan: Return in about 3 months (around 06/01/2024).

## 2024-03-05 ENCOUNTER — Ambulatory Visit: Admitting: Family Medicine

## 2024-03-05 DIAGNOSIS — Z113 Encounter for screening for infections with a predominantly sexual mode of transmission: Secondary | ICD-10-CM

## 2024-03-05 DIAGNOSIS — A539 Syphilis, unspecified: Secondary | ICD-10-CM

## 2024-03-05 LAB — HM HIV SCREENING LAB: HM HIV Screening: NEGATIVE

## 2024-03-05 NOTE — Patient Instructions (Signed)
 STI screening - Today we obtained a urine sample to screen for gonorrhea and chlamydia and oral swab to screen for gonorrhea and chlamydia - We also obtained a blood sample to screen for HIV and syphilis - If the results are abnormal, I will give you a call.    Estimated time frame for results collected at the [facility] Department: Same day Trichomonas Yeast BV (bacterial vaginosis)  Within 1-2 weeks Gonorrhea Chlamydia  Within 1-2 weeks HIV Syphilis Hepatitis B Hepatitis C

## 2024-03-05 NOTE — Progress Notes (Signed)
 Pt is here for std screening. Gaspar Garbe, RN

## 2024-03-05 NOTE — Progress Notes (Unsigned)
 [facility] Department STI clinic [address], [address] [place] [address] Main phone: [phone]  STI screening visit  Subjective:  Elijah Taylor is a 73 y.o. male being seen today for an STI screening visit. The patient reports they {Actions; do/do not:19616} have symptoms.    Patient has the following medical conditions:  Patient Active Problem List   Diagnosis Date Noted   Insomnia 06/23/2023   Controlled substance agreement signed 06/23/2023   History of colonic polyps 11/14/2022   Adenomatous polyp of colon 11/14/2022   S/P lumbar fusion 11/30/2020   S/P cervical spinal fusion 05/01/2020   Advanced care planning/counseling discussion 10/30/2017   Trigger middle finger of right hand 05/10/2017   Actinic keratosis 03/01/2017   Solar dermatitis 03/01/2017   Prostate cancer (HCC) 10/06/2016   Hypercholesteremia 10/06/2016   Chief Complaint  Patient presents with   SEXUALLY TRANSMITTED DISEASE   HPI Patient reports he would like STI testing for a ***  One new sexual   See flowsheet for further details and programmatic requirements  Hyperlink available at the top of the signed note in blue.  Flow sheet content below:  Pregnancy Intention Screening Does the patient want to become pregnant in the next year?: No Does the patient's partner want to become pregnant in the next year?: No Would the patient like to discuss contraceptive options today?: N/A Counseling Patient counseled to use condoms with all sex: Condoms declined RTC in 2-3 weeks for test results: Yes Clinic will call if test results abnormal before test result appt.: Yes Test results given to patient Patient counseled to use condoms with all sex: Condoms declined  Screening for MPX risk: Does the patient have an unexplained rash? {yes/no:20286} Is the patient MSM? {yes/no:20286} Does the patient endorse multiple sex partners or anonymous sex partners?  {yes/no:20286} Did the patient have close or sexual contact with a person diagnosed with MPX? {yes/no:20286} Has the patient traveled outside the [place]  where MPX is endemic? {yes/no:20286} Is there a high clinical suspicion for MPX-- evidenced by one of the following {yes/no:20286}  -Unlikely to be chickenpox  -Lymphadenopathy  -Rash that present in same phase of evolution on any given body part  STI screening history: Last HIV test per patient/review of record was  Lab Results  Component Value Date   HMHIVSCREEN Negative - Validated 12/11/2023   No results found for: HIV  Last HEPC test per patient/review of record was No results found for: HMHEPCSCREEN No components found for: HEPC   Last HEPB test per patient/review of record was No components found for: HMHEPBSCREEN   Fertility: Does the patient or their partner desires a pregnancy in the next year? {yes/no:20286}  Immunization History  Administered Date(s) Administered   PNEUMOCOCCAL CONJUGATE-20 06/23/2022   Td 06/22/2022   Tdap 08/08/2001   Zoster, Live 07/22/2013    The following portions of the patient's history were reviewed and updated as appropriate: allergies, current medications, past medical history, past social history, past surgical history and problem list.  Objective:  There were no vitals filed for this visit. *** Physical Exam   Assessment and Plan:  Elijah Taylor is a 73 y.o. male presenting to the [facility] Department for STI screening  1. Screening examination for venereal disease (Primary) ***  2. Syphilis ***   Patient {Action; does/does not:19097} have STI symptoms Patient accepted the following screenings: {STI screen options [facility]:32723} Patient meets criteria for HepB screening? {yes/no:20286}. Ordered? {Response; yes/no/na:63} Patient meets  criteria for HepC screening? {yes/no:20286}. Ordered? {Response; yes/no/na:63} Recommended condom use with all sex Discussed  importance of condom use for STI prevention  Treat positive test results per standing order. Discussed time line for State Lab results and that patient will be called with positive results and encouraged patient to call if he had not heard in 2 weeks Recommended repeat testing in 3 months with positive results. Recommended returning for continued or worsening symptoms.   No follow-ups on file.  Future Appointments  Date Time Provider Department Center  03/05/2024  2:40 PM Taylor Dorothyann HERO, MD [facility] None  06/04/2024  8:00 AM Vicci Duwaine SQUIBB, DO [facility]  06/25/2024  8:00 AM Vicci Duwaine SQUIBB, DO [facility]    Elijah HERO Macario, MD

## 2024-03-06 DIAGNOSIS — A539 Syphilis, unspecified: Secondary | ICD-10-CM | POA: Insufficient documentation

## 2024-03-06 DIAGNOSIS — Z113 Encounter for screening for infections with a predominantly sexual mode of transmission: Secondary | ICD-10-CM | POA: Insufficient documentation

## 2024-03-06 NOTE — Assessment & Plan Note (Addendum)
 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 RPR collected today. Received adequate treatment in May with bicillin  x3; if titer fails to come down or increases, would treat as though he has new syphilis infection with doxycycline 100 mg BID x 14 days (given bicillin  recall). Unclear if his wife received partner notification and testing/contact treatment during the May initial syphilis episode; encouraged him to have her get tested and treated as syphilis contact.  Elijah Taylor asks that this chart is not forwarded to his PCP.

## 2024-03-06 NOTE — Assessment & Plan Note (Signed)
 Bodily rash does not appear syphilic in nature, but due to patient's concern and exposure to new sexual partner, will screen for STI. Because of no known STI exposure, no contact treatment warranted at this time.

## 2024-03-07 LAB — CHLAMYDIA/GC NAA, CONFIRMATION
Chlamydia trachomatis, NAA: NEGATIVE
Neisseria gonorrhoeae, NAA: NEGATIVE

## 2024-03-13 LAB — SYPHILIS SEROLOGY, ~~LOC~~ LAB
RPR, Quant: 1:4 {titer}
RPR: 72925

## 2024-03-14 ENCOUNTER — Ambulatory Visit: Payer: Self-pay | Admitting: Family Medicine

## 2024-03-14 DIAGNOSIS — A539 Syphilis, unspecified: Secondary | ICD-10-CM

## 2024-03-15 ENCOUNTER — Encounter: Payer: Self-pay | Admitting: Family Medicine

## 2024-04-23 ENCOUNTER — Ambulatory Visit (INDEPENDENT_AMBULATORY_CARE_PROVIDER_SITE_OTHER): Admitting: Dermatology

## 2024-04-23 DIAGNOSIS — D485 Neoplasm of uncertain behavior of skin: Secondary | ICD-10-CM

## 2024-04-23 DIAGNOSIS — L408 Other psoriasis: Secondary | ICD-10-CM | POA: Diagnosis not present

## 2024-04-23 DIAGNOSIS — L219 Seborrheic dermatitis, unspecified: Secondary | ICD-10-CM

## 2024-04-23 DIAGNOSIS — D489 Neoplasm of uncertain behavior, unspecified: Secondary | ICD-10-CM

## 2024-04-23 NOTE — Progress Notes (Unsigned)
   Follow-Up Visit   Subjective  Elijah Taylor is a 73 y.o. male who presents for the following: Patient reports area of concern on the right side of his nose. No hx of skin cancer.    The following portions of the chart were reviewed this encounter and updated as appropriate: medications, allergies, medical history  Review of Systems:  No other skin or systemic complaints except as noted in HPI or Assessment and Plan.  Objective  Well appearing patient in no apparent distress; mood and affect are within normal limits.   A focused examination was performed of the following areas: Face  Relevant exam findings are noted in the Assessment and Plan.  Right Nasal Sidewall 9mm Pink scaly papule   Assessment & Plan  NEOPLASM OF UNCERTAIN BEHAVIOR Right Nasal Sidewall Skin / nail biopsy Type of biopsy: tangential   Informed consent: discussed and consent obtained   Timeout: patient name, date of birth, surgical site, and procedure verified   Patient was prepped and draped in usual sterile fashion: Area prepped with alcohol. Anesthesia: the lesion was anesthetized in a standard fashion   Anesthetic:  1% lidocaine  w/ epinephrine 1-100,000 buffered w/ 8.4% NaHCO3 Instrument used: flexible razor blade   Hemostasis achieved with: pressure, aluminum chloride and electrodesiccation   Outcome: patient tolerated procedure well   Post-procedure details: wound care instructions given   Post-procedure details comment:  Ointment and small bandage applied  Specimen 1 - Surgical pathology Differential Diagnosis: AK vs SCC vs Seborrheic dermatitis   Check Margins: No  Return for Pending biopsy results.  I, Emerick Ege, CMA am acting as scribe for Boneta Sharps, MD   Documentation: I have reviewed the above documentation for accuracy and completeness, and I agree with the above.  Boneta Sharps, MD

## 2024-04-23 NOTE — Patient Instructions (Signed)

## 2024-04-24 ENCOUNTER — Encounter: Payer: Self-pay | Admitting: Dermatology

## 2024-04-25 ENCOUNTER — Ambulatory Visit: Payer: Self-pay | Admitting: Dermatology

## 2024-04-25 LAB — SURGICAL PATHOLOGY

## 2024-04-29 ENCOUNTER — Telehealth: Payer: Self-pay

## 2024-04-29 NOTE — Telephone Encounter (Signed)
 Patient called to let Dr Claudene know he would like to try a topical cream for biopsy proven seb derm  [facility] in [place]

## 2024-04-30 ENCOUNTER — Other Ambulatory Visit: Payer: Self-pay

## 2024-04-30 MED ORDER — KETOCONAZOLE 2 % EX CREA: TOPICAL_CREAM | CUTANEOUS | 2 refills | Status: AC

## 2024-04-30 MED ORDER — HYDROCORTISONE 2.5 % EX CREA: TOPICAL_CREAM | Freq: Two times a day (BID) | CUTANEOUS | 2 refills | Status: AC | PRN

## 2024-04-30 NOTE — Telephone Encounter (Signed)
 Called patient and discussed hydrocortisone  2.5 cream and ketoconazole  cream to be mixed and applied BID until clear. Then only use ketoconazole  cream three times per week for maintenance.   Sent to Hydrocortisone  cream and Ketoconazole  cream to [facility]

## 2024-06-04 ENCOUNTER — Ambulatory Visit: Admitting: Family Medicine

## 2024-06-20 ENCOUNTER — Ambulatory Visit

## 2024-06-20 DIAGNOSIS — A539 Syphilis, unspecified: Secondary | ICD-10-CM

## 2024-06-20 NOTE — Progress Notes (Signed)
 [facility] Department STI clinic [address], [address] [place] [address] Main phone: [phone]  STI screening visit  Subjective:  Elijah Taylor is a 73 y.o. male being seen today for an STI screening visit. The patient reports they do not have symptoms.    Patient has the following medical conditions:  Patient Active Problem List   Diagnosis Date Noted   Screening examination for venereal disease 03/06/2024   Hx syphilis 03/06/2024   Insomnia 06/23/2023   Controlled substance agreement signed 06/23/2023   History of colonic polyps 11/14/2022   Adenomatous polyp of colon 11/14/2022   S/P lumbar fusion 11/30/2020   S/P cervical spinal fusion 05/01/2020   Advanced care planning/counseling discussion 10/30/2017   Trigger middle finger of right hand 05/10/2017   Actinic keratosis 03/01/2017   Solar dermatitis 03/01/2017   Prostate cancer (HCC) 10/06/2016   Hypercholesteremia 10/06/2016   Chief Complaint  Patient presents with   SEXUALLY TRANSMITTED DISEASE   HPI Patient to [facility] for syphilis re-testing following treatment for syphilis of unknown duration in May, 2025. Received Bicillin  x3.   See flowsheet for further details and programmatic requirements  Hyperlink available at the top of the signed note in blue.  Flow sheet content below:  Pregnancy Intention Screening Does the patient want to become pregnant in the next year?: N/A Does the patient's partner want to become pregnant in the next year?: No Would the patient like to discuss contraceptive options today?: N/A Reason For STD Screen STD Screening: Is asymptomatic Have you ever had an STD?: Yes History of Antibiotic use in the past 2 weeks?: No STD Symptoms Denies all: Yes Counseling Patient counseled to use condoms with all sex: Condoms declined RTC in 2-3 weeks for test results: Yes Clinic will call if test results abnormal before test result appt.: Yes Test results  given to patient Patient counseled to use condoms with all sex: Condoms declined  Screening for MPX risk:  Unexplained rash?  No   MSM?  No   Multiple or anonymous sex partners?  No   Any close or sexual contact with a person  diagnosed with MPX?  No   Any outside the [place]  where MPX is endemic?  No   High clinical suspicion for MPX?    -Unlikely to be chickenpox    -Lymphadenopathy    -Rash that presents in same phase of       evolution on any given body part  No   STI screening history: Last HIV test per patient/review of record was  Lab Results  Component Value Date   HMHIVSCREEN Negative - Validated 03/05/2024   No results found for: HIV  Last HEPC test per patient/review of record was No results found for: HMHEPCSCREEN No components found for: HEPC   Last HEPB test per patient/review of record was No components found for: HMHEPBSCREEN   Fertility: Does the patient or their partner desires a pregnancy in the next year? No  Immunization History  Administered Date(s) Administered   PNEUMOCOCCAL CONJUGATE-20 06/23/2022   Td 06/22/2022   Tdap 08/08/2001   Zoster, Live 07/22/2013    The following portions of the patient's history were reviewed and updated as appropriate: allergies, current medications, past medical history, past social history, past surgical history and problem list.  Objective:  There were no vitals filed for this visit.  Physical Exam Constitutional:      Appearance: Normal appearance.  HENT:     Head: Normocephalic.  Mouth/Throat:     Mouth: Mucous membranes are moist.  Eyes:     General: No scleral icterus.       Right eye: No discharge.        Left eye: No discharge.  Pulmonary:     Effort: Pulmonary effort is normal.  Skin:    General: Skin is warm and dry.     Findings: No rash.  Neurological:     General: No focal deficit present.     Mental Status: He is alert.  Psychiatric:        Mood and Affect: Mood normal. Affect is  flat.        Behavior: Behavior is withdrawn.    Assessment and Plan:  Elijah Taylor is a 73 y.o. male presenting to the [facility] Department for STI screening  1. Syphilis (Primary)  - Syphilis Serology, [place]  - Reports his wife has not been seen or treated, however he states they have not had sexual contact in >5 years - Discussed that with syphilis of unknown duration, that it is difficult to know when he was infected. Discussed transmission of syphilis via vaginal, oral, and anal sex. - Strongly recommended his wife be tested, and he reports she may not be willing to do so out of fear/embarrassment  Patient does not have STI symptoms Patient accepted the following screenings: RPR Only here for syphilis testing, denies all other tests.  Recommended condom use with all sex Discussed importance of condom use for STI prevention  Treat positive test results per standing order. Discussed time line for State Lab results and that patient will be called with positive results and encouraged patient to call if he had not heard in 2 weeks Recommended repeat testing in 3 months with positive results. Recommended returning for continued or worsening symptoms.   Return in about 6 months (around 12/18/2024).  Future Appointments  Date Time Provider Department Center  08/21/2024 10:40 AM Vicci Duwaine SQUIBB, DO [facility] [address]    Damien FORBES Satchel, NP

## 2024-06-20 NOTE — Progress Notes (Signed)
 Pt is here for STD screening. Condoms declined. Sonda Primes, RN.

## 2024-06-22 LAB — SYPHILIS SEROLOGY, ~~LOC~~ LAB
RPR, Quant: 1:1 {titer}
RPR: REACTIVE

## 2024-06-24 ENCOUNTER — Telehealth: Payer: Self-pay

## 2024-06-24 NOTE — Telephone Encounter (Signed)
 Florence Shaver at [facility] regarding Elijah Taylor. Wanted to ensure his wife had been properly notified given him disclosing she had not been tested or treated since he tested positive for syphilis. Shaver reports he is not on her list at this moment, and that Elijah Taylor was adamant he had not had contact with his wife in >5 years, as he disclosed to me.  No more follow up from the state at this time per [place].

## 2024-06-25 ENCOUNTER — Encounter: Payer: Self-pay | Admitting: Family Medicine

## 2024-07-02 ENCOUNTER — Ambulatory Visit: Payer: Self-pay

## 2024-07-02 NOTE — Progress Notes (Signed)
 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 Given history of syphilis, no confirmatory testing done.  No additional treatment needed.

## 2024-08-21 ENCOUNTER — Ambulatory Visit: Admitting: Family Medicine

## 2024-09-26 ENCOUNTER — Encounter: Admitting: Family Medicine
